# Patient Record
Sex: Female | Born: 1950 | Race: Black or African American | Hispanic: No | Marital: Married | Smoking: Never smoker
Health system: Southern US, Community
[De-identification: ages and names within clinical notes are randomized; demographics above are authoritative.]

## PROBLEM LIST (undated history)

## (undated) DIAGNOSIS — K219 Gastro-esophageal reflux disease without esophagitis: Secondary | ICD-10-CM

## (undated) DIAGNOSIS — E7800 Pure hypercholesterolemia, unspecified: Secondary | ICD-10-CM

## (undated) DIAGNOSIS — I10 Essential (primary) hypertension: Secondary | ICD-10-CM

## (undated) DIAGNOSIS — M1990 Unspecified osteoarthritis, unspecified site: Secondary | ICD-10-CM

## (undated) DIAGNOSIS — R7303 Prediabetes: Secondary | ICD-10-CM

## (undated) DIAGNOSIS — R42 Dizziness and giddiness: Secondary | ICD-10-CM

## (undated) HISTORY — DX: Gastro-esophageal reflux disease without esophagitis: K21.9

## (undated) HISTORY — PX: TUBAL LIGATION: SHX77

## (undated) HISTORY — PX: CATARACT EXTRACTION: SUR2

## (undated) HISTORY — PX: KNEE SURGERY: SHX244

## (undated) HISTORY — DX: Prediabetes: R73.03

## (undated) HISTORY — DX: Essential (primary) hypertension: I10

## (undated) HISTORY — DX: Pure hypercholesterolemia, unspecified: E78.00

---

## 1998-03-26 HISTORY — PX: BREAST BIOPSY: SHX20

## 2005-02-09 ENCOUNTER — Ambulatory Visit: Payer: Self-pay | Admitting: Occupational Therapy

## 2007-05-28 ENCOUNTER — Ambulatory Visit: Payer: Self-pay

## 2009-04-20 ENCOUNTER — Ambulatory Visit: Payer: Self-pay

## 2010-05-17 ENCOUNTER — Ambulatory Visit: Payer: Self-pay

## 2011-05-22 ENCOUNTER — Ambulatory Visit: Payer: Self-pay

## 2012-05-22 ENCOUNTER — Ambulatory Visit: Payer: Self-pay

## 2012-05-24 HISTORY — PX: COLONOSCOPY: SHX174

## 2012-08-07 ENCOUNTER — Ambulatory Visit (HOSPITAL_COMMUNITY)
Admission: RE | Admit: 2012-08-07 | Discharge: 2012-08-07 | Disposition: A | Discharge status: Home / Self Care | Payer: PRIVATE HEALTH INSURANCE | Source: Ambulatory Visit | Attending: Family Medicine | Admitting: Family Medicine

## 2012-08-07 ENCOUNTER — Other Ambulatory Visit (HOSPITAL_COMMUNITY): Payer: Self-pay | Admitting: Family Medicine

## 2012-08-07 DIAGNOSIS — R52 Pain, unspecified: Secondary | ICD-10-CM

## 2012-08-07 DIAGNOSIS — M545 Low back pain, unspecified: Secondary | ICD-10-CM | POA: Insufficient documentation

## 2012-10-02 ENCOUNTER — Ambulatory Visit: Payer: PRIVATE HEALTH INSURANCE | Admitting: Gastroenterology

## 2012-10-27 ENCOUNTER — Telehealth: Payer: Self-pay | Admitting: General Practice

## 2012-10-27 NOTE — Telephone Encounter (Signed)
I called the patient to discuss [employer] assistance, no answer, lmom

## 2012-10-27 NOTE — Telephone Encounter (Signed)
Message copied by Jennings Books on Mon Oct 27, 2012  4:08 PM ------      Message from: Diana Eves D      Created: Mon Oct 27, 2012  2:42 PM       Pt cancelled her OV due to no income and she has been trying to get [place] assistance, but can not reach anyone. She had Betty Ratliff's number and I told her there had been some changes with the assistance and that I would have CM call her back to go over what all she will need to do to apply for [facility]. [phone] ------

## 2012-10-30 ENCOUNTER — Ambulatory Visit: Payer: PRIVATE HEALTH INSURANCE | Admitting: Gastroenterology

## 2012-10-31 NOTE — Telephone Encounter (Signed)
I spoke with the patient and will leave her a financial app at the front desk with Darl Pikes

## 2012-11-20 ENCOUNTER — Encounter: Payer: Self-pay | Admitting: Gastroenterology

## 2012-11-20 ENCOUNTER — Other Ambulatory Visit: Payer: Self-pay | Admitting: Gastroenterology

## 2012-11-20 ENCOUNTER — Ambulatory Visit (INDEPENDENT_AMBULATORY_CARE_PROVIDER_SITE_OTHER): Payer: Self-pay | Admitting: Gastroenterology

## 2012-11-20 VITALS — BP 112/74 | HR 100 | Temp 98.4°F | Ht 62.0 in | Wt 214.8 lb

## 2012-11-20 DIAGNOSIS — D369 Benign neoplasm, unspecified site: Secondary | ICD-10-CM | POA: Insufficient documentation

## 2012-11-20 DIAGNOSIS — R1319 Other dysphagia: Secondary | ICD-10-CM

## 2012-11-20 DIAGNOSIS — K219 Gastro-esophageal reflux disease without esophagitis: Secondary | ICD-10-CM

## 2012-11-20 DIAGNOSIS — R1310 Dysphagia, unspecified: Secondary | ICD-10-CM

## 2012-11-20 NOTE — Progress Notes (Signed)
Primary Care Physician:  Lucius Conn Primary Gastroenterologist:  Dr. Darrick Penna   Chief Complaint  Patient presents with  . Dysphagia  . Gastrophageal Reflux    HPI:   Shannon Gallegos is a 62 year old female presenting today at the request of her PCP due to GERD and vague dysphagia.   Notes onset of symptoms beginning of the year. Trial of Omeprazole, OTC agents, with some improvement but persistent dry throat, clearing of throat, bad taste in mouth, coating of tongue. Past few months, bowel habits have changed. Feels stomach bubbling, feels like she has taken a laxative when she hasn't. Notes losing her appetite due to bitter, bile taste. Not typical indigestion but more bitter tasting in her mouth. Worse in morning. Now with progressing pill dysphagia, no solid food dysphagia. Vague dysphagia, pills getting "hung". No significant abdominal pain. Feels unsettled, "yucky". Took Pepto Bismol then noted black stool, otherwise no evidence of true melena. Reported approximately 6 or 7 pounds lost over the course of the year. Was taking Omeprazole at night.   Lower GI symptoms: all over the [facility] scale. Last issue with constipation months ago. Now stool is more soft, goes daily, sometimes a few times a day. No diarrhea. No recent antibiotics.    Last colonoscopy in March 2014 with adenomatous polyp, [facility]. Due for surveillance 2019.   Past Medical History  Diagnosis Date  . Borderline diabetes   . Hypertension   . Hypercholesterolemia   . GERD (gastroesophageal reflux disease)     Past Surgical History  Procedure Laterality Date  . Knee surgery      both  . Tubal ligation    . Colonoscopy  March 2014    Dr. Erma Heritage: sigmoid and descending colon diverticula, ADENOMATOUS polyp. Due for surveillance 2019    Current Outpatient Prescriptions  Medication Sig Dispense Refill  . aspirin 81 MG tablet Take 81 mg by mouth daily as needed for pain.      . fluticasone (FLONASE)  50 MCG/ACT nasal spray Place 2 sprays into the nose daily.      . meclizine (ANTIVERT) 25 MG tablet Take 25 mg by mouth 3 (three) times daily as needed.      . Melatonin 5 MG TABS Take 5 mg by mouth daily.      Marland Kitchen omeprazole (PRILOSEC) 40 MG capsule Take 40 mg by mouth daily.      . pravastatin (PRAVACHOL) 40 MG tablet Take 40 mg by mouth daily.      . traMADol (ULTRAM) 50 MG tablet Take 50 mg by mouth every 6 (six) hours as needed for pain.      Marland Kitchen triamcinolone (KENALOG) 0.025 % cream Apply 1 application topically 3 (three) times daily.      Marland Kitchen triamterene-hydrochlorothiazide (DYAZIDE) 50-25 MG per capsule Take 1 capsule by mouth every morning.       No current facility-administered medications for this visit.    Allergies as of 11/20/2012  . (No Known Allergies)    Family History  Problem Relation Age of Onset  . Colon cancer Neg Hx   . Cirrhosis Father     alcoholic    History   Social History  . Marital Status: Married    Spouse Name: N/A    Number of Children: N/A  . Years of Education: N/A   Occupational History  . retired    Social History Main Topics  . Smoking status: Never Smoker   . Smokeless tobacco: Not on file  .  Alcohol Use: No  . Drug Use: No  . Sexual Activity: Not on file   Other Topics Concern  . Not on file   Social History Narrative  . No narrative on file    Review of Systems: Negative unless mentioned in HPI.   Physical Exam: BP 112/74  Pulse 100  Temp(Src) 98.4 F (36.9 C) (Oral)  Ht 5\' 2"  (1.575 m)  Wt 214 lb 12.8 oz (97.433 kg)  BMI 39.28 kg/m2 General:   Alert and oriented. Pleasant and cooperative. Well-nourished and well-developed.  Head:  Normocephalic and atraumatic. Eyes:  Without icterus, sclera clear and conjunctiva pink.  Ears:  Normal auditory acuity. Nose:  No deformity, discharge,  or lesions. Mouth:  No deformity or lesions, oral mucosa pink.  Neck:  Supple, without mass or thyromegaly. Lungs:  Clear to  auscultation bilaterally. No wheezes, rales, or rhonchi. No distress.  Heart:  S1, S2 present without murmurs appreciated.  Abdomen:  +BS, soft, non-tender and non-distended. No HSM noted. No guarding or rebound. No masses appreciated.  Rectal:  Deferred  Msk:  Symmetrical without gross deformities. Normal posture. Pulses:  Normal pulses noted. Extremities:  Without clubbing or edema. Neurologic:  Alert and  oriented x4;  grossly normal neurologically. Skin:  Intact without significant lesions or rashes. Cervical Nodes:  No significant cervical adenopathy. Psych:  Alert and cooperative. Normal mood and affect.

## 2012-11-20 NOTE — Assessment & Plan Note (Signed)
[facility] 2014. Surveillance 2019.

## 2012-11-20 NOTE — Patient Instructions (Addendum)
For now, stop Omeprazole. Start taking Dexilant each morning. We have provided samples and forms to help assist with cost.  We have scheduled you for an upper endoscopy with possible dilation with Dr. Darrick Penna in the near future.  We will see you back after the procedure.   Diet for Gastroesophageal Reflux Disease, Adult Reflux (acid reflux) is when acid from your stomach flows up into the esophagus. When acid comes in contact with the esophagus, the acid causes irritation and soreness (inflammation) in the esophagus. When reflux happens often or so severely that it causes damage to the esophagus, it is called gastroesophageal reflux disease (GERD). Nutrition therapy can help ease the discomfort of GERD. FOODS OR DRINKS TO AVOID OR LIMIT  Smoking or chewing tobacco. Nicotine is one of the most potent stimulants to acid production in the gastrointestinal tract.  Caffeinated and decaffeinated coffee and black tea.  Regular or low-calorie carbonated beverages or energy drinks (caffeine-free carbonated beverages are allowed).   Strong spices, such as black pepper, white pepper, red pepper, cayenne, curry powder, and chili powder.  Peppermint or spearmint.  Chocolate.  High-fat foods, including meats and fried foods. Extra added fats including oils, butter, salad dressings, and nuts. Limit these to less than 8 tsp per day.  Fruits and vegetables if they are not tolerated, such as citrus fruits or tomatoes.  Alcohol.  Any food that seems to aggravate your condition. If you have questions regarding your diet, call your caregiver or a registered dietitian. OTHER THINGS THAT MAY HELP GERD INCLUDE:   Eating your meals slowly, in a relaxed setting.  Eating 5 to 6 small meals per day instead of 3 large meals.  Eliminating food for a period of time if it causes distress.  Not lying down until 3 hours after eating a meal.  Keeping the head of your bed raised 6 to 9 inches (15 to 23 cm) by  using a foam wedge or blocks under the legs of the bed. Lying flat may make symptoms worse.  Being physically active. Weight loss may be helpful in reducing reflux in overweight or obese adults.  Wear loose fitting clothing EXAMPLE MEAL PLAN This meal plan is approximately 2,000 calories based on https://www.bernard.org/ meal planning guidelines. Breakfast   cup cooked oatmeal.  1 cup strawberries.  1 cup low-fat milk.  1 oz almonds. Snack  1 cup cucumber slices.  6 oz yogurt (made from low-fat or fat-free milk). Lunch  2 slice whole-wheat bread.  2 oz sliced [place].  2 tsp mayonnaise.  1 cup blueberries.  1 cup snap peas. Snack  6 whole-wheat crackers.  1 oz string cheese. Dinner   cup brown rice.  1 cup mixed veggies.  1 tsp olive oil.  3 oz grilled fish. Document Released: 03/12/2005 Document Revised: 06/04/2011 Document Reviewed: 01/26/2011 [facility] Patient Information 2014 [place], [place].

## 2012-11-20 NOTE — Assessment & Plan Note (Signed)
Dilation as planned.  

## 2012-11-20 NOTE — Progress Notes (Signed)
cc'd to pcp 

## 2012-11-20 NOTE — Assessment & Plan Note (Addendum)
62 year old with persistent GERD despite Omeprazole 40 mg daily; although she was taking this at night, she does complain of vague pill dysphagia and persistent bitter taste, vague nausea. Question symptoms secondary to uncontrolled GERD, occult web, ring or stricture, may even have an underlying gastroparesis component. Warrants further investigation via EGD.   Trial of Dexilant, samples provided.  Proceed with upper endoscopy/dilation in the near future with Dr. Darrick Penna. The risks, benefits, and alternatives have been discussed in detail with patient. They have stated understanding and desire to proceed.  If persistent dry throat/clearing of throat, refer to ENT if EGD benign GERD diet provided

## 2012-11-25 ENCOUNTER — Encounter (HOSPITAL_COMMUNITY): Payer: Self-pay | Admitting: Pharmacy Technician

## 2012-12-09 ENCOUNTER — Ambulatory Visit (HOSPITAL_COMMUNITY)
Admission: RE | Admit: 2012-12-09 | Discharge: 2012-12-09 | Disposition: A | Discharge status: Home / Self Care | Payer: Self-pay | Source: Ambulatory Visit | Attending: Gastroenterology | Admitting: Gastroenterology

## 2012-12-09 ENCOUNTER — Telehealth: Payer: Self-pay | Admitting: Gastroenterology

## 2012-12-09 ENCOUNTER — Encounter (HOSPITAL_COMMUNITY): Payer: Self-pay

## 2012-12-09 ENCOUNTER — Encounter (HOSPITAL_COMMUNITY)
Admission: RE | Disposition: A | Discharge status: Home / Self Care | Payer: Self-pay | Source: Ambulatory Visit | Attending: Gastroenterology

## 2012-12-09 DIAGNOSIS — R1310 Dysphagia, unspecified: Secondary | ICD-10-CM

## 2012-12-09 DIAGNOSIS — R1319 Other dysphagia: Secondary | ICD-10-CM

## 2012-12-09 DIAGNOSIS — K2960 Other gastritis without bleeding: Secondary | ICD-10-CM

## 2012-12-09 DIAGNOSIS — K222 Esophageal obstruction: Secondary | ICD-10-CM

## 2012-12-09 DIAGNOSIS — D131 Benign neoplasm of stomach: Secondary | ICD-10-CM | POA: Insufficient documentation

## 2012-12-09 DIAGNOSIS — I10 Essential (primary) hypertension: Secondary | ICD-10-CM | POA: Insufficient documentation

## 2012-12-09 DIAGNOSIS — R7309 Other abnormal glucose: Secondary | ICD-10-CM | POA: Insufficient documentation

## 2012-12-09 DIAGNOSIS — K219 Gastro-esophageal reflux disease without esophagitis: Secondary | ICD-10-CM

## 2012-12-09 DIAGNOSIS — K2940 Chronic atrophic gastritis without bleeding: Secondary | ICD-10-CM | POA: Insufficient documentation

## 2012-12-09 DIAGNOSIS — K3189 Other diseases of stomach and duodenum: Secondary | ICD-10-CM | POA: Insufficient documentation

## 2012-12-09 DIAGNOSIS — K449 Diaphragmatic hernia without obstruction or gangrene: Secondary | ICD-10-CM | POA: Insufficient documentation

## 2012-12-09 HISTORY — PX: ESOPHAGOGASTRODUODENOSCOPY (EGD) WITH ESOPHAGEAL DILATION: SHX5812

## 2012-12-09 SURGERY — ESOPHAGOGASTRODUODENOSCOPY (EGD) WITH ESOPHAGEAL DILATION
Anesthesia: Moderate Sedation

## 2012-12-09 MED ORDER — MEPERIDINE HCL 100 MG/ML IJ SOLN
INTRAMUSCULAR | Status: DC | PRN
  Administered 2012-12-09: 25 mg via INTRAVENOUS
  Administered 2012-12-09: 50 mg via INTRAVENOUS

## 2012-12-09 MED ORDER — MIDAZOLAM HCL 5 MG/5ML IJ SOLN
INTRAMUSCULAR | Status: DC | PRN
  Administered 2012-12-09 (×2): 2 mg via INTRAVENOUS
  Administered 2012-12-09: 1 mg via INTRAVENOUS

## 2012-12-09 MED ORDER — STERILE WATER FOR IRRIGATION IR SOLN
Status: DC | PRN
  Administered 2012-12-09: 14:00:00

## 2012-12-09 MED ORDER — BUTAMBEN-TETRACAINE-BENZOCAINE 2-2-14 % EX AERO
INHALATION_SPRAY | CUTANEOUS | Status: DC | PRN
  Administered 2012-12-09: 2 via TOPICAL

## 2012-12-09 MED ORDER — MEPERIDINE HCL 100 MG/ML IJ SOLN
INTRAMUSCULAR | Status: AC
  Filled 2012-12-09: qty 2

## 2012-12-09 MED ORDER — SODIUM CHLORIDE 0.9 % IV SOLN
INTRAVENOUS | Status: DC
  Administered 2012-12-09: 13:00:00 via INTRAVENOUS

## 2012-12-09 MED ORDER — MIDAZOLAM HCL 5 MG/5ML IJ SOLN
INTRAMUSCULAR | Status: AC
  Filled 2012-12-09: qty 10

## 2012-12-09 MED ORDER — MINERAL OIL PO OIL
TOPICAL_OIL | ORAL | Status: AC
  Filled 2012-12-09: qty 30

## 2012-12-09 NOTE — Telephone Encounter (Signed)
Patient is scheduled w/Dr. Suszanne Conners in the [facility] on Thursday Oct 9th at 1:30 and I will call and let Ms. Boyington know

## 2012-12-09 NOTE — H&P (Signed)
Primary Care Physician:  Lucius Conn Primary Gastroenterologist:  Dr. Darrick Penna  Pre-Procedure History & Physical: HPI:  Shannon Gallegos is a 62 y.o. female here for DYSPHAGIA/NEW ONSET DYSPEPSIA.  Past Medical History  Diagnosis Date  . Borderline diabetes   . Hypertension   . Hypercholesterolemia   . GERD (gastroesophageal reflux disease)     Past Surgical History  Procedure Laterality Date  . Knee surgery      both  . Tubal ligation    . Colonoscopy  March 2014    Dr. Erma Heritage: sigmoid and descending colon diverticula, ADENOMATOUS polyp. Due for surveillance 2019    Prior to Admission medications   Medication Sig Start Date End Date Taking? Authorizing Provider  aspirin 81 MG tablet Take 81 mg by mouth daily.    Yes Historical Provider, MD  dexlansoprazole (DEXILANT) 60 MG capsule Take 60 mg by mouth daily.   Yes Historical Provider, MD  diphenhydramine-acetaminophen (TYLENOL PM) 25-500 MG TABS Take 1 tablet by mouth at bedtime as needed.   Yes Historical Provider, MD  fluticasone (FLONASE) 50 MCG/ACT nasal spray Place 2 sprays into the nose daily.   Yes Historical Provider, MD  meclizine (ANTIVERT) 25 MG tablet Take 25 mg by mouth 3 (three) times daily as needed for dizziness.    Yes Historical Provider, MD  Melatonin 5 MG TABS Take 5 mg by mouth at bedtime.    Yes Historical Provider, MD  pravastatin (PRAVACHOL) 40 MG tablet Take 40 mg by mouth daily.   Yes Historical Provider, MD  traMADol (ULTRAM) 50 MG tablet Take 50 mg by mouth every 6 (six) hours as needed for pain.   Yes Historical Provider, MD  triamcinolone (KENALOG) 0.025 % cream Apply 1 application topically 3 (three) times daily.   Yes Historical Provider, MD  triamterene-hydrochlorothiazide (DYAZIDE) 50-25 MG per capsule Take 1 capsule by mouth every morning.   Yes Historical Provider, MD  omeprazole (PRILOSEC) 40 MG capsule Take 40 mg by mouth daily.    Historical Provider, MD    Allergies as of  11/20/2012  . (No Known Allergies)    Family History  Problem Relation Age of Onset  . Colon cancer Neg Hx   . Cirrhosis Father     alcoholic    History   Social History  . Marital Status: Married    Spouse Name: N/A    Number of Children: N/A  . Years of Education: N/A   Occupational History  . retired    Social History Main Topics  . Smoking status: Never Smoker   . Smokeless tobacco: Not on file  . Alcohol Use: No  . Drug Use: No  . Sexual Activity: Not on file   Other Topics Concern  . Not on file   Social History Narrative  . No narrative on file    Review of Systems: See HPI, otherwise negative ROS   Physical Exam: BP 125/83  Pulse 91  Temp(Src) 97.5 F (36.4 C) (Oral)  Ht 5\' 2"  (1.575 m)  Wt 214 lb (97.07 kg)  BMI 39.13 kg/m2  SpO2 100% General:   Alert,  pleasant and cooperative in NAD Head:  Normocephalic and atraumatic. Neck:  Supple; Lungs:  Clear throughout to auscultation.    Heart:  Regular rate and rhythm. Abdomen:  Soft, nontender and nondistended. Normal bowel sounds, without guarding, and without rebound.   Neurologic:  Alert and  oriented x4;  grossly normal neurologically.  Impression/Plan:  DYSPHAGIA/NEW ONSET DYSPEPSIA  PLAN:  EGD/?DIL TODAY

## 2012-12-09 NOTE — Telephone Encounter (Signed)
Message copied by Glendora Score on Tue Dec 09, 2012  4:17 PM ------      Message from: [place]      Created: Tue Dec 09, 2012  4:10 PM       Refer to ent DR. Suszanne Conners for dry throat ------

## 2012-12-09 NOTE — Op Note (Signed)
[facility] [address] [place], [address]   ENDOSCOPY PROCEDURE REPORT  PATIENT: Shannon, Gallegos  MR#: [id number] BIRTHDATE: [birth year] , 62  yrs. old GENDER: Female  ENDOSCOPIST: Jonette Eva, MD REFFERED [id number] Merilynn Finland, PA-C  PROCEDURE DATE:  12/09/2012 PROCEDURE:   EGD with biopsy and with dilatation over guidewire  INDICATIONS:1.  dysphagia.   2.  dyspepsia.   3.  DRY THROAT. MEDICATIONS: Demerol 75 mg IV and Versed 5 mg IV TOPICAL ANESTHETIC: Cetacaine Spray  DESCRIPTION OF PROCEDURE:   After the risks benefits and alternatives of the procedure were thoroughly explained, informed consent was obtained.  The EG-[id number] ([id number])  endoscope was introduced through the mouth and advanced to the second portion of the duodenum. The instrument was slowly withdrawn as the mucosa was carefully examined.  Prior to withdrawal of the scope, the guidwire was placed.  The esophagus was dilated successfully.  The patient was recovered in endoscopy and discharged home in satisfactory condition.   ESOPHAGUS: A small hiatal hernia was noted.   A Schatzki ring was found at the gastroesophageal junction.   STOMACH: An innumerable number of small sessile polyps were found in the gastric body. Multiple biopsies was performed using cold forceps.   Moderate erosive gastritis (inflammation) was found in the gastric antrum. Multiple biopsies were performed using cold forceps.   DUODENUM: The duodenal mucosa showed no abnormalities in the bulb and second portion of the duodenum.   Dilation was then performed at the gastroesphageal junction  Dilator: Savary over guidewire Size(s): 15-16 MM Resistance: minimal Heme: yes  COMPLICATIONS: There were no complications.  ENDOSCOPIC IMPRESSION: 1.   Small hiatal hernia 2.   Schatzki ring at the gastroesophageal junction 3.   Innumerable number of small sessile polyps 4.   MODERATE Erosive  gastritis  RECOMMENDATIONS: CONTINUE YOUR WEIGHT LOSS EFFORTS. CONTINUE OMEPRAZOLE.  TAKE 30 MINUTES PRIOR TO YOUR FIRST MEAL. FOLLOW A LOW FAT DIET. BIOPSY WILL BE BACK IN 7 DAYS. ENT REFERRAL FOR DRY THROAT FOLLOW UP IN 3 MOS.      _______________________________ Rosalie DoctorJonette Eva, MD 12/09/2012 4:03 PM      PATIENT NAME:  Shannon, Gallegos MR#: [id number]

## 2012-12-11 NOTE — Telephone Encounter (Signed)
REVIEWED.  

## 2012-12-12 ENCOUNTER — Encounter (HOSPITAL_COMMUNITY): Payer: Self-pay | Admitting: Gastroenterology

## 2012-12-13 ENCOUNTER — Telehealth: Payer: Self-pay | Admitting: Gastroenterology

## 2012-12-13 NOTE — Telephone Encounter (Signed)
Please call pt. HER stomach Bx shows gastritis. SHE HAS BENIGN STOMACH POLYPS.   CONTINUE YOUR WEIGHT LOSS EFFORTS. CONTINUE OMEPRAZOLE.  TAKE 30 MINUTES PRIOR TO YOUR FIRST MEAL.  FOLLOW A LOW FAT DIET.   SEE ENT FOR HER DRY THROAT.  FOLLOW UP IN 3 MOS E30 W/ AS DYSPHAGIA/ABD PAIN.

## 2012-12-15 NOTE — Telephone Encounter (Signed)
Reminder in epic °

## 2012-12-15 NOTE — Telephone Encounter (Signed)
Pt aware of results 

## 2013-01-01 ENCOUNTER — Ambulatory Visit (INDEPENDENT_AMBULATORY_CARE_PROVIDER_SITE_OTHER): Payer: Self-pay | Admitting: Otolaryngology

## 2013-01-01 ENCOUNTER — Encounter (INDEPENDENT_AMBULATORY_CARE_PROVIDER_SITE_OTHER): Payer: Self-pay

## 2013-01-01 DIAGNOSIS — R070 Pain in throat: Secondary | ICD-10-CM

## 2013-03-05 ENCOUNTER — Encounter: Payer: Self-pay | Admitting: Gastroenterology

## 2013-03-24 ENCOUNTER — Telehealth: Payer: Self-pay | Admitting: Gastroenterology

## 2013-03-24 NOTE — Telephone Encounter (Signed)
Patient called to see if we could fax Shannon Gallegos about getting her Dexilant prescription. She said she was having to renew the paperwork and was told by Shannon Gallegos if she doesn't have proof of income for [place] to just write something out for her stating that she hasn't worked and has received no income and that would be the fastest way for her to start receiving her Dexilant. Any questions call Shannon Gallegos at [phone]

## 2013-03-25 ENCOUNTER — Encounter: Payer: Self-pay | Admitting: Gastroenterology

## 2013-03-25 NOTE — Telephone Encounter (Signed)
AS did letter, it has been faxed to the pt assistance company. Tried to call pt- LM with this information.

## 2013-04-22 ENCOUNTER — Ambulatory Visit: Payer: Self-pay | Admitting: Gastroenterology

## 2013-04-23 ENCOUNTER — Encounter: Payer: Self-pay | Admitting: Gastroenterology

## 2013-04-23 ENCOUNTER — Ambulatory Visit (INDEPENDENT_AMBULATORY_CARE_PROVIDER_SITE_OTHER): Payer: Self-pay | Admitting: Gastroenterology

## 2013-04-23 VITALS — BP 124/83 | HR 93 | Temp 97.6°F | Wt 207.6 lb

## 2013-04-23 DIAGNOSIS — K117 Disturbances of salivary secretion: Secondary | ICD-10-CM | POA: Insufficient documentation

## 2013-04-23 DIAGNOSIS — R1013 Epigastric pain: Secondary | ICD-10-CM | POA: Insufficient documentation

## 2013-04-23 DIAGNOSIS — R1310 Dysphagia, unspecified: Secondary | ICD-10-CM

## 2013-04-23 DIAGNOSIS — D369 Benign neoplasm, unspecified site: Secondary | ICD-10-CM

## 2013-04-23 DIAGNOSIS — K319 Disease of stomach and duodenum, unspecified: Secondary | ICD-10-CM | POA: Insufficient documentation

## 2013-04-23 DIAGNOSIS — K219 Gastro-esophageal reflux disease without esophagitis: Secondary | ICD-10-CM

## 2013-04-23 DIAGNOSIS — R682 Dry mouth, unspecified: Secondary | ICD-10-CM | POA: Insufficient documentation

## 2013-04-23 DIAGNOSIS — K3189 Other diseases of stomach and duodenum: Secondary | ICD-10-CM

## 2013-04-23 NOTE — Assessment & Plan Note (Signed)
Improved. Still with pill dysphagia. Consider BPE if worsens. No solid food dysphagia. 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

## 2013-04-23 NOTE — Patient Instructions (Addendum)
Continue to take Dexilant each morning.   We will try to get a second opinion from an ENT specialist. I also am having our office check on possible dental professionals in the [facility] network.   We have scheduled you for an ultrasound of your belly.   Further recommendations to follow!  Gastritis, Adult Gastritis is soreness and swelling (inflammation) of the lining of the stomach. Gastritis can develop as a sudden onset (acute) or long-term (chronic) condition. If gastritis is not treated, it can lead to stomach bleeding and ulcers. CAUSES  Gastritis occurs when the stomach lining is weak or damaged. Digestive juices from the stomach then inflame the weakened stomach lining. The stomach lining may be weak or damaged due to viral or bacterial infections. One common bacterial infection is the Helicobacter pylori infection. Gastritis can also result from excessive alcohol consumption, taking certain medicines, or having too much acid in the stomach.  SYMPTOMS  In some cases, there are no symptoms. When symptoms are present, they may include:  Pain or a burning sensation in the upper abdomen.  Nausea.  Vomiting.  An uncomfortable feeling of fullness after eating. DIAGNOSIS  Your caregiver may suspect you have gastritis based on your symptoms and a physical exam. To determine the cause of your gastritis, your caregiver may perform the following:  Blood or stool tests to check for the H pylori bacterium.  Gastroscopy. A thin, flexible tube (endoscope) is passed down the esophagus and into the stomach. The endoscope has a light and camera on the end. Your caregiver uses the endoscope to view the inside of the stomach.  Taking a tissue sample (biopsy) from the stomach to examine under a microscope. TREATMENT  Depending on the cause of your gastritis, medicines may be prescribed. If you have a bacterial infection, such as an H pylori infection, antibiotics may be given. If your gastritis  is caused by too much acid in the stomach, H2 blockers or antacids may be given. Your caregiver may recommend that you stop taking aspirin, ibuprofen, or other nonsteroidal anti-inflammatory drugs (NSAIDs). HOME CARE INSTRUCTIONS  Only take over-the-counter or prescription medicines as directed by your caregiver.  If you were given antibiotic medicines, take them as directed. Finish them even if you start to feel better.  Drink enough fluids to keep your urine clear or pale yellow.  Avoid foods and drinks that make your symptoms worse, such as:  Caffeine or alcoholic drinks.  Chocolate.  Peppermint or mint flavorings.  Garlic and onions.  Spicy foods.  Citrus fruits, such as oranges, lemons, or limes.  Tomato-based foods such as sauce, chili, salsa, and pizza.  Fried and fatty foods.  Eat small, frequent meals instead of large meals. SEEK IMMEDIATE MEDICAL CARE IF:   You have black or dark red stools.  You vomit blood or material that looks like coffee grounds.  You are unable to keep fluids down.  Your abdominal pain gets worse.  You have a fever.  You do not feel better after 1 week.  You have any other questions or concerns. MAKE SURE YOU:  Understand these instructions.  Will watch your condition.  Will get help right away if you are not doing well or get worse. Document Released: 03/06/2001 Document Revised: 09/11/2011 Document Reviewed: 04/25/2011 [facility] Patient Information 2014 [place].

## 2013-04-23 NOTE — Assessment & Plan Note (Addendum)
Vague epigastric discomfort, "queasiness" intermittently. Likely secondary to gastritis, dietary behaviors. Continue Dexilant daily. [place] of abdomen; gallbladder remains in situ. Highly doubt biliary component.

## 2013-04-23 NOTE — Assessment & Plan Note (Signed)
Controlled with Dexilant. Continue daily.

## 2013-04-23 NOTE — Progress Notes (Signed)
Referring Provider: Avelino Leeds* Primary Care Physician:  Mackey Birchwood Primary GI: Dr. Oneida Alar   Chief Complaint  Patient presents with  . Follow-up  . Nausea    HPI:   Shannon Gallegos presents today in follow-up after EGD/ED secondary to dysphagia. Noted small hiatal hernia, Schatzki's ring at GE junction s/p Savary dilation, benign sessile polyps, moderate gastritis. Colonoscopy up-to-date; due for surveillance in 2019.  Complains of persistent dry throat. Saw ENT, Dr. Melene Plan. Drinks a few swallows of water then 2 minutes feels dry again. Has water by bedside at night to swallow. States was told to get "entertainer spray" and lemon drops to promote saliva. Has a very bitter taste in mouth.  Feels queasy a lot in upper abdomen. Will have spells during the day, feels like has to throw up. Not associated with eating. Sometimes epigastric discomfort with eating but not severe. Sometimes pill dysphagia, gets a little "hung" but better with water. No solid food dysphagia. Has significant bitter taste in mouth. Dexilant has helped. Sometimes when eating pickles, has pain in back of throat. Has to sneeze.   Worried about paternal aunt who had stage IV liver cancer.   Past Medical History  Diagnosis Date  . Borderline diabetes   . Hypertension   . Hypercholesterolemia   . GERD (gastroesophageal reflux disease)     Past Surgical History  Procedure Laterality Date  . Knee surgery      both  . Tubal ligation    . Colonoscopy  March 2014    Dr. Ellender Hose: sigmoid and descending colon diverticula, ADENOMATOUS polyp. Due for surveillance 2019  . Esophagogastroduodenoscopy (egd) with esophageal dilation N/A 12/09/2012    Dr. Oneida Alar: small hiatal hernia, Schatzki's ring at GE junction s/p Savary dilation, benign sessile polyps, moderate gastritis    Current Outpatient Prescriptions  Medication Sig Dispense Refill  . aspirin 81 MG tablet Take 81 mg by mouth daily.        Marland Kitchen dexlansoprazole (DEXILANT) 60 MG capsule Take 60 mg by mouth daily.      . diphenhydramine-acetaminophen (TYLENOL PM) 25-500 MG TABS Take 1 tablet by mouth at bedtime as needed.      . fluticasone (FLONASE) 50 MCG/ACT nasal spray Place 2 sprays into the nose daily.      . meclizine (ANTIVERT) 25 MG tablet Take 25 mg by mouth 3 (three) times daily as needed for dizziness.       . Melatonin 5 MG TABS Take 5 mg by mouth at bedtime.       . pravastatin (PRAVACHOL) 40 MG tablet Take 40 mg by mouth daily.      . traMADol (ULTRAM) 50 MG tablet Take 50 mg by mouth every 6 (six) hours as needed for pain.      Marland Kitchen triamcinolone (KENALOG) 0.025 % cream Apply 1 application topically 3 (three) times daily.      Marland Kitchen triamterene-hydrochlorothiazide (DYAZIDE) 50-25 MG per capsule Take 1 capsule by mouth every morning.       No current facility-administered medications for this visit.    Allergies as of 04/23/2013  . (No Known Allergies)    Family History  Problem Relation Age of Onset  . Colon cancer Neg Hx   . Cirrhosis Father     alcoholic    History   Social History  . Marital Status: Married    Spouse Name: N/A    Number of Children: N/A  . Years  of Education: N/A   Occupational History  . retired    Social History Main Topics  . Smoking status: Never Smoker   . Smokeless tobacco: None  . Alcohol Use: No  . Drug Use: No  . Sexual Activity: None   Other Topics Concern  . None   Social History Narrative  . None    Review of Systems: As mentioned in HPI  Physical Exam: BP 124/83  Pulse 93  Temp(Src) 97.6 F (36.4 C) (Oral)  Wt 207 lb 9.6 oz (94.167 kg) General:   Alert and oriented. No distress noted. Pleasant and cooperative. Talkative.  Head:  Normocephalic and atraumatic. Eyes:  Conjuctiva clear without scleral icterus. Mouth:  Oral mucosa pink and moist. Partial dentures Neck:  Supple, without mass or thyromegaly. Heart:  S1, S2 present without murmurs, rubs, or  gallops. Regular rate and rhythm. Abdomen:  +BS, soft, mild discomfort, "awareness" when palpating epigastric region and non-distended. No rebound or guarding. No HSM or masses noted. Msk:  Symmetrical without gross deformities. Normal posture. Psych:  Alert and cooperative. Normal mood and affect.

## 2013-04-23 NOTE — Assessment & Plan Note (Signed)
Surveillance in 2019.

## 2013-04-23 NOTE — Assessment & Plan Note (Signed)
Persistent. Has seen ENT without any abnormal findings (Dr. Melene Plan). Patient requesting second opinion. Have also asked her to have a dental examination as well. Unclear etiology; from a GI standpoint, no further work-up.

## 2013-04-23 NOTE — Progress Notes (Signed)
cc'd to pcp 

## 2013-04-24 ENCOUNTER — Ambulatory Visit (HOSPITAL_COMMUNITY)
Admission: RE | Admit: 2013-04-24 | Discharge: 2013-04-24 | Disposition: A | Discharge status: Home / Self Care | Payer: Self-pay | Source: Ambulatory Visit | Attending: Gastroenterology | Admitting: Gastroenterology

## 2013-04-24 DIAGNOSIS — R110 Nausea: Secondary | ICD-10-CM | POA: Insufficient documentation

## 2013-04-24 DIAGNOSIS — K3189 Other diseases of stomach and duodenum: Secondary | ICD-10-CM | POA: Insufficient documentation

## 2013-04-24 DIAGNOSIS — R1013 Epigastric pain: Secondary | ICD-10-CM

## 2013-04-24 DIAGNOSIS — K7689 Other specified diseases of liver: Secondary | ICD-10-CM | POA: Insufficient documentation

## 2013-04-28 ENCOUNTER — Other Ambulatory Visit: Payer: Self-pay | Admitting: Gastroenterology

## 2013-04-28 DIAGNOSIS — K7689 Other specified diseases of liver: Secondary | ICD-10-CM

## 2013-04-28 NOTE — Progress Notes (Signed)
Quick Note:  Called and informed pt. OK to schedule CT. ______

## 2013-04-28 NOTE — Progress Notes (Signed)
Quick Note:  [place] of abdomen without gallstones or wall thickening.  She has a complex cyst-like structure in her liver.  I feel this warrants further investigation: needs CT abd to evaluate liver cyst further. ______

## 2013-05-01 ENCOUNTER — Ambulatory Visit (HOSPITAL_COMMUNITY)
Admission: RE | Admit: 2013-05-01 | Discharge: 2013-05-01 | Disposition: A | Discharge status: Home / Self Care | Payer: Self-pay | Source: Ambulatory Visit | Attending: Gastroenterology | Admitting: Gastroenterology

## 2013-05-01 DIAGNOSIS — R109 Unspecified abdominal pain: Secondary | ICD-10-CM | POA: Insufficient documentation

## 2013-05-01 DIAGNOSIS — N200 Calculus of kidney: Secondary | ICD-10-CM | POA: Insufficient documentation

## 2013-05-01 DIAGNOSIS — K7689 Other specified diseases of liver: Secondary | ICD-10-CM | POA: Insufficient documentation

## 2013-05-01 DIAGNOSIS — D3500 Benign neoplasm of unspecified adrenal gland: Secondary | ICD-10-CM | POA: Insufficient documentation

## 2013-05-01 DIAGNOSIS — K449 Diaphragmatic hernia without obstruction or gangrene: Secondary | ICD-10-CM | POA: Insufficient documentation

## 2013-05-01 LAB — POCT I-STAT CREATININE: CREATININE: 1.3 mg/dL — AB (ref 0.50–1.10)

## 2013-05-01 MED ORDER — IOHEXOL 300 MG/ML  SOLN
100.0000 mL | Freq: Once | INTRAMUSCULAR | Status: AC | PRN
  Administered 2013-05-01: 100 mL via INTRAVENOUS

## 2013-05-03 DIAGNOSIS — M25569 Pain in unspecified knee: Secondary | ICD-10-CM | POA: Insufficient documentation

## 2013-05-05 ENCOUNTER — Telehealth: Payer: Self-pay | Admitting: *Deleted

## 2013-05-05 NOTE — Progress Notes (Signed)
Quick Note:  CT reveals a liver cyst. We will just monitor this. [place] of abdomen in 6 months just to monitor.  Small hiatal hernia. Looks like lower part of esophagus may have some "thickening". EGD up-to-date Aug 2014. Known gastritis. How are her symptoms? If any persistent dysphagia, needs BPE. ______

## 2013-05-05 NOTE — Telephone Encounter (Signed)
I believe it was done 2/6. I have sent result note back to you.

## 2013-05-05 NOTE — Telephone Encounter (Signed)
Pt called wanting to get the results of her CT she had done last Friday. Please Advise [phone]

## 2013-05-05 NOTE — Telephone Encounter (Signed)
Routing to Anna Sams, NP. 

## 2013-05-06 ENCOUNTER — Other Ambulatory Visit: Payer: Self-pay | Admitting: Gastroenterology

## 2013-05-06 DIAGNOSIS — R1319 Other dysphagia: Secondary | ICD-10-CM

## 2013-05-06 NOTE — Progress Notes (Signed)
Quick Note:  Routing back to Laban Emperor, NP also. ______

## 2013-05-06 NOTE — Progress Notes (Signed)
Quick Note:  Called and informed pt. She said she has a very bitter taste in her mouth a lot and wants to know what that comes from. She still does have some problems with swallowing and has to drink water to get food down at times. Ok to schedule the BPE. ______

## 2013-05-11 ENCOUNTER — Ambulatory Visit (HOSPITAL_COMMUNITY)
Admission: RE | Admit: 2013-05-11 | Discharge: 2013-05-11 | Disposition: A | Discharge status: Home / Self Care | Payer: Self-pay | Source: Ambulatory Visit | Attending: Gastroenterology | Admitting: Gastroenterology

## 2013-05-11 DIAGNOSIS — R1319 Other dysphagia: Secondary | ICD-10-CM

## 2013-05-11 DIAGNOSIS — K449 Diaphragmatic hernia without obstruction or gangrene: Secondary | ICD-10-CM | POA: Insufficient documentation

## 2013-05-11 DIAGNOSIS — R1310 Dysphagia, unspecified: Secondary | ICD-10-CM | POA: Insufficient documentation

## 2013-05-11 DIAGNOSIS — K225 Diverticulum of esophagus, acquired: Secondary | ICD-10-CM | POA: Insufficient documentation

## 2013-05-12 NOTE — Progress Notes (Signed)
Quick Note:  BPE reviewed. As expected, she has mild age-related changes of esophageal dysmotility.  CT raised question of some type of irregularity of esophagus/stomach. Not seen on BPE. EGD up-to-date.  Let's have patient return for a non-urgent appt. Gallbladder remains in situ. ?biliary dyskinesia as cuplrit of dyspepsia?  Needs to chew well, take small bites, break large pills in smaller pieces (IF they are appropriate to be divided). Sit upright while eating. ______

## 2013-05-13 NOTE — Progress Notes (Signed)
Pt has appointment 06/01/13 at 2:00 with [facility]

## 2013-05-13 NOTE — Progress Notes (Signed)
Quick Note:  Pt aware of results/ routing to Chelsey to schedule appt. ______

## 2013-05-27 ENCOUNTER — Ambulatory Visit: Payer: Self-pay

## 2013-06-01 ENCOUNTER — Ambulatory Visit (HOSPITAL_COMMUNITY)
Admission: RE | Admit: 2013-06-01 | Discharge: 2013-06-01 | Disposition: A | Discharge status: Home / Self Care | Payer: Self-pay | Source: Ambulatory Visit | Attending: Gastroenterology | Admitting: Gastroenterology

## 2013-06-01 ENCOUNTER — Ambulatory Visit (INDEPENDENT_AMBULATORY_CARE_PROVIDER_SITE_OTHER): Payer: Self-pay | Admitting: Gastroenterology

## 2013-06-01 ENCOUNTER — Encounter: Payer: Self-pay | Admitting: Gastroenterology

## 2013-06-01 VITALS — BP 137/84 | HR 77 | Temp 98.1°F | Wt 207.4 lb

## 2013-06-01 DIAGNOSIS — K219 Gastro-esophageal reflux disease without esophagitis: Secondary | ICD-10-CM

## 2013-06-01 DIAGNOSIS — M545 Low back pain, unspecified: Secondary | ICD-10-CM | POA: Insufficient documentation

## 2013-06-01 DIAGNOSIS — R109 Unspecified abdominal pain: Secondary | ICD-10-CM

## 2013-06-01 DIAGNOSIS — R682 Dry mouth, unspecified: Secondary | ICD-10-CM

## 2013-06-01 DIAGNOSIS — K117 Disturbances of salivary secretion: Secondary | ICD-10-CM

## 2013-06-01 NOTE — Progress Notes (Signed)
Referring Provider: Avelino Leeds* Primary Care Physician:  Mackey Birchwood Primary GI: Dr. Oneida Alar   Chief Complaint  Patient presents with  . Follow-up    HPI:   Returns today in follow-up with history of dry throat, dysphagia, GERD. EGD and colonoscopy up-to-date. Last seen Jan 2015 doing well with Dexilant. Epigastric discomfort noted at that time. [place] of abdomen ordered revealing complex cystic structure in left hepatic lobe, otherwise normal. CT ordered to further characterize, revealing possible thickening/mass involving distal esophagus/proximal stomach. Tiny hepatic cyst, right adrenal adenoma, right renal stone without obstruction. BPE then ordered to further evaluate esophagus, as EGD was up to date. This showed small hiatal hernia, tiny zenker diverticulum, smooth appearance of esophagus.   Notes right flank pain/right lower back, constant. Worsened with movements. Denies abdominal pain. Dry throat, mouth, persistent. Wakes up in middle of night with dry mouth. Avoiding spicy, greasy foods. Has seen ENT (Dr. Melene Plan). Wants a second opinion.   Past Medical History  Diagnosis Date  . Borderline diabetes   . Hypertension   . Hypercholesterolemia   . GERD (gastroesophageal reflux disease)     Past Surgical History  Procedure Laterality Date  . Knee surgery      both  . Tubal ligation    . Colonoscopy  March 2014    Dr. Ellender Hose: sigmoid and descending colon diverticula, ADENOMATOUS polyp. Due for surveillance 2019  . Esophagogastroduodenoscopy (egd) with esophageal dilation N/A 12/09/2012    Dr. Oneida Alar: small hiatal hernia, Schatzki's ring at GE junction s/p Savary dilation, benign sessile polyps, moderate gastritis    Current Outpatient Prescriptions  Medication Sig Dispense Refill  . aspirin 81 MG tablet Take 81 mg by mouth daily.       Marland Kitchen dexlansoprazole (DEXILANT) 60 MG capsule Take 60 mg by mouth daily.      . diphenhydramine-acetaminophen (TYLENOL  PM) 25-500 MG TABS Take 1 tablet by mouth at bedtime as needed.      . fluticasone (FLONASE) 50 MCG/ACT nasal spray Place 2 sprays into the nose daily.      . meclizine (ANTIVERT) 25 MG tablet Take 25 mg by mouth 3 (three) times daily as needed for dizziness.       . Melatonin 5 MG TABS Take 5 mg by mouth at bedtime.       . pravastatin (PRAVACHOL) 40 MG tablet Take 40 mg by mouth daily.      . traMADol (ULTRAM) 50 MG tablet Take 50 mg by mouth every 6 (six) hours as needed for pain.      Marland Kitchen triamcinolone (KENALOG) 0.025 % cream Apply 1 application topically 3 (three) times daily.      Marland Kitchen triamterene-hydrochlorothiazide (DYAZIDE) 50-25 MG per capsule Take 1 capsule by mouth every morning.       No current facility-administered medications for this visit.    Allergies as of 06/01/2013  . (No Known Allergies)    Family History  Problem Relation Age of Onset  . Colon cancer Neg Hx   . Cirrhosis Father     alcoholic    History   Social History  . Marital Status: Married    Spouse Name: N/A    Number of Children: N/A  . Years of Education: N/A   Occupational History  . retired    Social History Main Topics  . Smoking status: Never Smoker   . Smokeless tobacco: None  . Alcohol Use: No  . Drug Use: No  .  Sexual Activity: None   Other Topics Concern  . None   Social History Narrative  . None    Review of Systems: As mentioned in HPI.   Physical Exam: BP 137/84  Pulse 77  Temp(Src) 98.1 F (36.7 C) (Oral)  Wt 207 lb 7.2 oz (94.099 kg) General:   Alert and oriented. No distress noted. Pleasant and cooperative.  Head:  Normocephalic and atraumatic. Eyes:  Conjuctiva clear without scleral icterus. Mouth:  Oral mucosa pink and moist. Good dentition. No lesions. Heart:  S1, S2 present without murmurs, rubs, or gallops.  Abdomen:  +BS, soft, non-tender and non-distended. No rebound or guarding. No HSM or masses noted. Msk:  Symmetrical without gross deformities. Normal  posture. Extremities:  Without edema. Neurologic:  Alert and  oriented x4;  grossly normal neurologically. Skin:  Intact without significant lesions or rashes. Psych:  Alert and cooperative. Normal mood and affect.

## 2013-06-01 NOTE — Patient Instructions (Signed)
Stop Dexilant for 1 week. Instead, take Ranitidine in the evening for 7 days. If you have no change in the symptoms of dry mouth, resume taking Dexilant daily.  We have referred you again to an ENT specialist.  Please complete the xray today. We will call with the results!

## 2013-06-03 NOTE — Progress Notes (Signed)
Mrs. Limb has opted to hold off on referral to ENT, Dr. Benjamine Mola is the only ENT that accepts the [facility] Assistance and she cant afford to pay out of pocket at this time. She will see how things go and if she changes her mind she will call back to be re referred

## 2013-06-04 ENCOUNTER — Telehealth: Payer: Self-pay | Admitting: *Deleted

## 2013-06-04 DIAGNOSIS — R109 Unspecified abdominal pain: Secondary | ICD-10-CM | POA: Insufficient documentation

## 2013-06-04 NOTE — Telephone Encounter (Signed)
Please see under result notes.

## 2013-06-04 NOTE — Assessment & Plan Note (Signed)
Persistent. Thorough GI work-up on file. Refer to ENT for second opinion.

## 2013-06-04 NOTE — Assessment & Plan Note (Signed)
Right flank/right back discomfort. Constant, aggravated by movements. Patient concerned that renal stone is culprit; highly doubt this. Non-obstructive. She has no urinary symptoms or other concerning signs. Question musculoskeletal. She has been thoroughly evaluated recently with [place] of abdomen, CT, and BPE.   Proceed with lumbar xray Refer back to PCP [facility] nephrology appt due to renal stones; patient declined.

## 2013-06-04 NOTE — Telephone Encounter (Signed)
Pt aware of results 

## 2013-06-04 NOTE — Telephone Encounter (Signed)
Pt is calling about her results from her x-ray, pt said doris called her yesterday. Please advise [phone]

## 2013-06-04 NOTE — Telephone Encounter (Signed)
I do not see anything to tell pat. Please advise

## 2013-06-04 NOTE — Progress Notes (Signed)
Quick Note:  Chronic changes on xray. Mild scoliosis Would recommend follow-up with PCP. ______

## 2013-06-04 NOTE — Assessment & Plan Note (Signed)
Continue Dexilant daily.  

## 2013-06-08 NOTE — Progress Notes (Signed)
cc'd to pcp 

## 2013-06-16 NOTE — Progress Notes (Signed)
REVIEWED.  EGD/DIL SEP 2014 GASTRITIS, FG POLYPS

## 2013-06-16 NOTE — Progress Notes (Signed)
REVIEWED.  

## 2013-06-25 DIAGNOSIS — Z9849 Cataract extraction status, unspecified eye: Secondary | ICD-10-CM | POA: Insufficient documentation

## 2013-07-07 DIAGNOSIS — Z961 Presence of intraocular lens: Secondary | ICD-10-CM | POA: Insufficient documentation

## 2013-07-15 DIAGNOSIS — Z0289 Encounter for other administrative examinations: Secondary | ICD-10-CM

## 2013-08-04 DIAGNOSIS — H04129 Dry eye syndrome of unspecified lacrimal gland: Secondary | ICD-10-CM | POA: Insufficient documentation

## 2014-06-15 DIAGNOSIS — M722 Plantar fascial fibromatosis: Secondary | ICD-10-CM | POA: Insufficient documentation

## 2014-06-30 ENCOUNTER — Ambulatory Visit
Admit: 2014-06-30 | Disposition: A | Discharge status: Home / Self Care | Payer: Self-pay | Attending: Urgent Care | Admitting: Urgent Care

## 2014-11-06 ENCOUNTER — Encounter (HOSPITAL_COMMUNITY): Payer: Self-pay | Admitting: *Deleted

## 2014-11-06 ENCOUNTER — Emergency Department (HOSPITAL_COMMUNITY)
Admission: EM | Admit: 2014-11-06 | Discharge: 2014-11-06 | Disposition: A | Discharge status: Home / Self Care | Payer: Self-pay | Attending: Emergency Medicine | Admitting: Emergency Medicine

## 2014-11-06 ENCOUNTER — Emergency Department (HOSPITAL_COMMUNITY): Payer: Self-pay

## 2014-11-06 DIAGNOSIS — Z791 Long term (current) use of non-steroidal anti-inflammatories (NSAID): Secondary | ICD-10-CM | POA: Insufficient documentation

## 2014-11-06 DIAGNOSIS — Z79899 Other long term (current) drug therapy: Secondary | ICD-10-CM | POA: Insufficient documentation

## 2014-11-06 DIAGNOSIS — E780 Pure hypercholesterolemia: Secondary | ICD-10-CM | POA: Insufficient documentation

## 2014-11-06 DIAGNOSIS — I10 Essential (primary) hypertension: Secondary | ICD-10-CM | POA: Insufficient documentation

## 2014-11-06 DIAGNOSIS — Z7982 Long term (current) use of aspirin: Secondary | ICD-10-CM | POA: Insufficient documentation

## 2014-11-06 DIAGNOSIS — M1711 Unilateral primary osteoarthritis, right knee: Secondary | ICD-10-CM | POA: Insufficient documentation

## 2014-11-06 DIAGNOSIS — Z7951 Long term (current) use of inhaled steroids: Secondary | ICD-10-CM | POA: Insufficient documentation

## 2014-11-06 DIAGNOSIS — Z7952 Long term (current) use of systemic steroids: Secondary | ICD-10-CM | POA: Insufficient documentation

## 2014-11-06 DIAGNOSIS — K219 Gastro-esophageal reflux disease without esophagitis: Secondary | ICD-10-CM | POA: Insufficient documentation

## 2014-11-06 MED ORDER — MELOXICAM 7.5 MG PO TABS
7.5000 mg | ORAL_TABLET | Freq: Every day | ORAL | Status: DC
Start: 2014-11-06 — End: 2014-11-06

## 2014-11-06 MED ORDER — OXYCODONE-ACETAMINOPHEN 5-325 MG PO TABS
1.0000 | ORAL_TABLET | Freq: Once | ORAL | Status: AC
  Administered 2014-11-06: 1 via ORAL
  Filled 2014-11-06: qty 1

## 2014-11-06 MED ORDER — IBUPROFEN 800 MG PO TABS
800.0000 mg | ORAL_TABLET | Freq: Once | ORAL | Status: AC
  Administered 2014-11-06: 800 mg via ORAL
  Filled 2014-11-06: qty 1

## 2014-11-06 MED ORDER — DICLOFENAC SODIUM 75 MG PO TBEC: 75.0000 mg | DELAYED_RELEASE_TABLET | Freq: Two times a day (BID) | ORAL | Status: DC

## 2014-11-06 MED ORDER — OXYCODONE-ACETAMINOPHEN 5-325 MG PO TABS: 1.0000 | ORAL_TABLET | ORAL | Status: DC | PRN

## 2014-11-06 NOTE — Discharge Instructions (Signed)
Osteoarthritis °Osteoarthritis is a disease that causes soreness and inflammation of a joint. It occurs when the cartilage at the affected joint wears down. Cartilage acts as a cushion, covering the ends of bones where they meet to form a joint. Osteoarthritis is the most common form of arthritis. It often occurs in older people. The joints affected most often by this condition include those in the: °· Ends of the fingers. °· Thumbs. °· Neck. °· Lower back. °· Knees. °· Hips. °CAUSES  °Over time, the cartilage that covers the ends of bones begins to wear away. This causes bone to rub on bone, producing pain and stiffness in the affected joints.  °RISK FACTORS °Certain factors can increase your chances of having osteoarthritis, including: °· Older age. °· Excessive body weight. °· Overuse of joints. °· Previous joint injury. °SIGNS AND SYMPTOMS  °· Pain, swelling, and stiffness in the joint. °· Over time, the joint may lose its normal shape. °· Small deposits of bone (osteophytes) may grow on the edges of the joint. °· Bits of bone or cartilage can break off and float inside the joint space. This may cause more pain and damage. °DIAGNOSIS  °Your health care provider will do a physical exam and ask about your symptoms. Various tests may be ordered, such as: °· X-rays of the affected joint. °· An MRI scan. °· Blood tests to rule out other types of arthritis. °· Joint fluid tests. This involves using a needle to draw fluid from the joint and examining the fluid under a microscope. °TREATMENT  °Goals of treatment are to control pain and improve joint function. Treatment plans may include: °· A prescribed exercise program that allows for rest and joint relief. °· A weight control plan. °· Pain relief techniques, such as: °¨ Properly applied heat and cold. °¨ Electric pulses delivered to nerve endings under the skin (transcutaneous electrical nerve stimulation [TENS]). °¨ Massage. °¨ Certain nutritional  supplements. °· Medicines to control pain, such as: °¨ Acetaminophen. °¨ Nonsteroidal anti-inflammatory drugs (NSAIDs), such as naproxen. °¨ Narcotic or central-acting agents, such as tramadol. °¨ Corticosteroids. These can be given orally or as an injection. °· Surgery to reposition the bones and relieve pain (osteotomy) or to remove loose pieces of bone and cartilage. Joint replacement may be needed in advanced states of osteoarthritis. °HOME CARE INSTRUCTIONS  °· Take medicines only as directed by your health care provider. °· Maintain a healthy weight. Follow your health care provider's instructions for weight control. This may include dietary instructions. °· Exercise as directed. Your health care provider can recommend specific types of exercise. These may include: °¨ Strengthening exercises. These are done to strengthen the muscles that support joints affected by arthritis. They can be performed with weights or with exercise bands to add resistance. °¨ Aerobic activities. These are exercises, such as brisk walking or low-impact aerobics, that get your heart pumping. °¨ Range-of-motion activities. These keep your joints limber. °¨ Balance and agility exercises. These help you maintain daily living skills. °· Rest your affected joints as directed by your health care provider. °· Keep all follow-up visits as directed by your health care provider. °SEEK MEDICAL CARE IF:  °· Your skin turns red. °· You develop a rash in addition to your joint pain. °· You have worsening joint pain. °· You have a fever along with joint or muscle aches. °SEEK IMMEDIATE MEDICAL CARE IF: °· You have a significant loss of weight or appetite. °· You have night sweats. °FOR MORE   [place] of Arthritis and Musculoskeletal and Skin Diseases: www.niams.SouthExposed.es  [employer] on Aging: http://kim-miller.com/  [employer] of Rheumatology: www.rheumatology.org Document Released: 03/12/2005 Document Revised:  07/27/2013 Document Reviewed: 11/17/2012 [facility] Patient Information 2015 [place], [place]. This information is not intended to replace advice given to you by your health care provider. Make sure you discuss any questions you have with your health care provider.  You  may use the medication prescribed for increased pain control in place of your hydrocodone.  I recommend adding your anti-inflammatory pain reliever as well.  Follow-up with your orthopedic provider in [facility] if your symptoms persist.  Elevation and heat application 20 minutes several times daily may also offer relief.

## 2014-11-06 NOTE — ED Notes (Signed)
Pt states pain to right knee, described as throbbing and tightness to the bend of the knee. Hx of two previous knee surgeries to same knee.

## 2014-11-06 NOTE — ED Provider Notes (Signed)
CSN: [id number]     Arrival date & time 11/06/14  0757 History   First MD Initiated Contact with Patient 11/06/14 [phone]     Chief Complaint  Patient presents with  . Knee Pain     (Consider location/radiation/quality/duration/timing/severity/associated sxs/prior Treatment) The history is provided by the patient.   Shannon Gallegos is a 64 y.o. female with a past medical history of hypertension, GERD and hypercholesterolemia in a significant history of prior right knee surgery secondary to trauma presenting with acute on chronic right knee pain.  She endorses she has recently been seen by an orthopedist at [facility] who has recommended a complete right knee replacement due to advanced arthritis which she has been avoiding.  Since yesterday she has had increased pain described as throbbing in the knee joint radiating to her posterior knee despite any known injuries or overuse.  She feels tight in the knee and an weakness like it might "buckle".  There is no radiation of pain.  Pain is worsened with attempts at weightbearing and with flexion.  She has used salon pas pads and her regular hydrocodone with no improvement in pain today.     Past Medical History  Diagnosis Date  . Borderline diabetes   . Hypertension   . Hypercholesterolemia   . GERD (gastroesophageal reflux disease)    Past Surgical History  Procedure Laterality Date  . Knee surgery      both  . Tubal ligation    . Colonoscopy  March 2014    Dr. Ellender Hose: sigmoid and descending colon diverticula, ADENOMATOUS polyp. Due for surveillance 2019  . Esophagogastroduodenoscopy (egd) with esophageal dilation N/A 12/09/2012    Dr. Oneida Alar: small hiatal hernia, Schatzki's ring at GE junction s/p Savary dilation, benign sessile polyps, moderate gastritis   Family History  Problem Relation Age of Onset  . Colon cancer Neg Hx   . Cirrhosis Father     alcoholic   Social History  Substance Use Topics  . Smoking status: Never Smoker    . Smokeless tobacco: None  . Alcohol Use: No   OB History    No data available     Review of Systems  Constitutional: Negative for fever.  Musculoskeletal: Positive for joint swelling and arthralgias. Negative for myalgias.  Neurological: Negative for weakness and numbness.      Allergies  Review of patient's allergies indicates no known allergies.  Home Medications   Prior to Admission medications   Medication Sig Start Date End Date Taking? Authorizing Provider  aspirin EC 81 MG tablet Take 81 mg by mouth daily.   Yes Historical Provider, MD  dexlansoprazole (DEXILANT) 60 MG capsule Take 60 mg by mouth daily.   Yes Historical Provider, MD  diclofenac (VOLTAREN) 75 MG EC tablet Take 75 mg by mouth 2 (two) times daily. 12/02/13  Yes Historical Provider, MD  diphenhydramine-acetaminophen (TYLENOL PM) 25-500 MG TABS Take 1 tablet by mouth at bedtime as needed.   Yes Historical Provider, MD  fluticasone (FLONASE) 50 MCG/ACT nasal spray Place 2 sprays into the nose daily.   Yes Historical Provider, MD  gabapentin (NEURONTIN) 300 MG capsule Take 300 mg by mouth 3 (three) times daily.   Yes Historical Provider, MD  Melatonin 5 MG TABS Take 5 mg by mouth at bedtime.    Yes Historical Provider, MD  pravastatin (PRAVACHOL) 40 MG tablet Take 40 mg by mouth daily.   Yes Historical Provider, MD  triamterene-hydrochlorothiazide (DYAZIDE) 50-25 MG per capsule Take 1  capsule by mouth every morning.   Yes Historical Provider, MD  meclizine (ANTIVERT) 25 MG tablet Take 25 mg by mouth 3 (three) times daily as needed for dizziness.     Historical Provider, MD  oxyCODONE-acetaminophen (PERCOCET/ROXICET) 5-325 MG per tablet Take 1 tablet by mouth every 4 (four) hours as needed. 11/06/14   Evalee Jefferson, PA-C  traMADol (ULTRAM) 50 MG tablet Take 50 mg by mouth every 6 (six) hours as needed for pain.    Historical Provider, MD  triamcinolone (KENALOG) 0.025 % cream Apply 1 application topically 3 (three)  times daily.    Historical Provider, MD   There were no vitals taken for this visit. Physical Exam  Constitutional: She appears well-developed and well-nourished.  HENT:  Head: Atraumatic.  Neck: Normal range of motion.  Cardiovascular:  Pulses equal bilaterally  Musculoskeletal: She exhibits edema and tenderness.       Right knee: She exhibits decreased range of motion and effusion. She exhibits no LCL laxity and no MCL laxity. Tenderness found. Medial joint line and lateral joint line tenderness noted.  Crepitus with flexion of the right knee.  She also has crepitus noted with gentle movement of the patella.  Edema noted upper outer quadrant of knee, no erythema.  Calf is nontender there is no ankle edema.  She has a good full dorsalis pedal pulse.  Calf and thigh is soft and nontender.  Neurological: She is alert. She has normal strength. She displays normal reflexes. No sensory deficit.  Skin: Skin is warm and dry.  Psychiatric: She has a normal mood and affect.    ED Course  Procedures (including critical care time) Labs Review Labs Reviewed - No data to display  Imaging Review Dg Knee Complete 4 Views Right  11/06/2014   CLINICAL DATA:  Right knee pain and swelling without reported injury.  EXAM: RIGHT KNEE - COMPLETE 4+ VIEW  COMPARISON:  Aug 07, 2012.  FINDINGS: There is no evidence of fracture, dislocation, or joint effusion. Mild tricompartmental joint space narrowing is noted. Soft tissues are unremarkable.  IMPRESSION: Mild tricompartmental degenerative joint disease is noted. No acute abnormality seen in the right knee.   Electronically Signed   By: Marijo Conception, M.D.   On: 11/06/2014 09:25     EKG Interpretation None      MDM   Final diagnoses:  Primary osteoarthritis of right knee    Patients labs and/or radiological studies were reviewed and considered during the medical decision making and disposition process. Results were also discussed with patient.    Imaging was viewed, interpreted and I agree with radiologists reading.     Pt was prescribed oxycodone, to start taking her anti inflammatory. F/u with ortho, referral given for Dr. Aline Brochure as pt states will be unable to f/u at [facility]. The patient appears reasonably screened and/or stabilized for discharge and I doubt any other medical condition or other [facility] requiring further screening, evaluation, or treatment in the ED at this time prior to discharge.   Evalee Jefferson, PA-C 11/06/14 0936  Evalee Jefferson, PA-C 11/06/14 1747  Milton Ferguson, MD 11/07/14 [phone]

## 2014-11-10 ENCOUNTER — Telehealth: Payer: Self-pay | Admitting: Orthopedic Surgery

## 2014-11-10 NOTE — Telephone Encounter (Signed)
Patient called to request appointment following Emergency Room visit of 11/06/14 for problem of right knee pain.  Discussed and offered appointment; states her 100% [employer] discount is pending.  Aware that the visit would be self-pay if schedules at this time; patient elects to wait until re-application of discount is approved, and will call back at that time.  Her ph#'s are: (cell) [phone] and (home) [phone].

## 2014-11-11 ENCOUNTER — Telehealth: Payer: Self-pay | Admitting: Gastroenterology

## 2014-11-11 NOTE — Telephone Encounter (Signed)
Patient called this afternoon asking about [facility] Assistance. She is reapplying and wanted [place] to check to see if we had her tax papers because she can't find them. I looked thru everything that was scanned and didn't see anything. She had asked to speak with CM when she first called and was told CM would be back in the morning and then she asked for me. I told her that I would have CM call her back tomorrow and check behind me in case I overlooked anything. [phone] or [phone]

## 2014-11-12 NOTE — Telephone Encounter (Signed)
I instructed the patient to call whomever prepared her tax return back in 2013 to see if they have a copy.  She stated she will do that and bring it to our office for me to fax to the financial assistance department, she thanked me for my help and ended the call.

## 2014-11-12 NOTE — Telephone Encounter (Signed)
Per Shannon Gallegos she wanted to renew her financial assistance, however she can't find her 2013 tax return.

## 2015-01-04 ENCOUNTER — Ambulatory Visit: Payer: Self-pay | Admitting: Orthopedic Surgery

## 2015-01-19 ENCOUNTER — Telehealth: Payer: Self-pay | Admitting: Orthopedic Surgery

## 2015-01-19 NOTE — Telephone Encounter (Signed)
Opened in Error.

## 2015-01-27 ENCOUNTER — Ambulatory Visit (INDEPENDENT_AMBULATORY_CARE_PROVIDER_SITE_OTHER): Payer: Self-pay | Admitting: Orthopedic Surgery

## 2015-01-27 VITALS — BP 136/88 | Ht 62.0 in | Wt 213.0 lb

## 2015-01-27 DIAGNOSIS — M1712 Unilateral primary osteoarthritis, left knee: Secondary | ICD-10-CM

## 2015-01-27 DIAGNOSIS — M1711 Unilateral primary osteoarthritis, right knee: Secondary | ICD-10-CM

## 2015-01-27 DIAGNOSIS — M5441 Lumbago with sciatica, right side: Secondary | ICD-10-CM

## 2015-01-27 DIAGNOSIS — M25562 Pain in left knee: Secondary | ICD-10-CM

## 2015-01-27 NOTE — Patient Instructions (Addendum)
CALL [facility] THERAPY DEPT TO SCHEDULE THERAPY VISITS  Joint Injection Care After Refer to this sheet in the next few days. These instructions provide you with information on caring for yourself after you have had a joint injection. Your caregiver also may give you more specific instructions. Your treatment has been planned according to current medical practices, but problems sometimes occur. Call your caregiver if you have any problems or questions after your procedure. After any type of joint injection, it is not uncommon to experience:  Soreness, swelling, or bruising around the injection site.  Mild numbness, tingling, or weakness around the injection site caused by the numbing medicine used before or with the injection. It also is possible to experience the following effects associated with the specific agent after injection:  Iodine-based contrast agents:  Allergic reaction (itching, hives, widespread redness, and swelling beyond the injection site).  Corticosteroids (These effects are rare.):  Allergic reaction.  Increased blood sugar levels (If you have diabetes and you notice that your blood sugar levels have increased, notify your caregiver).  Increased blood pressure levels.  Mood swings.  Hyaluronic acid in the use of viscosupplementation.  Temporary heat or redness.  Temporary rash and itching.  Increased fluid accumulation in the injected joint. These effects all should resolve within a day after your procedure.  HOME CARE INSTRUCTIONS  Limit yourself to light activity the day of your procedure. Avoid lifting heavy objects, bending, stooping, or twisting.  Take prescription or over-the-counter pain medication as directed by your caregiver.  You may apply ice to your injection site to reduce pain and swelling the day of your procedure. Ice may be applied 03-04 times:  Put ice in a plastic bag.  Place a towel between your skin and the bag.  Leave the ice on for no  longer than 15-20 minutes each time. SEEK IMMEDIATE MEDICAL CARE IF:   Pain and swelling get worse rather than better or extend beyond the injection site.  Numbness does not go away.  Blood or fluid continues to leak from the injection site.  You have chest pain.  You have swelling of your face or tongue.  You have trouble breathing or you become dizzy.  You develop a fever, chills, or severe tenderness at the injection site that last longer than 1 day. MAKE SURE YOU:  Understand these instructions.  Watch your condition.  Get help right away if you are not doing well or if you get worse. Document Released: 11/23/2010 Document Revised: 06/04/2011 Document Reviewed: 11/23/2010 Exit[employer] Patient Information 2015 [employer]. This information is not intended to replace advice given to you by your health care provider. Make sure you discuss any questions you have with your health care provider.  

## 2015-01-27 NOTE — Progress Notes (Signed)
Shannon Gallegos is a 64 y.o. female   HPI:  Knee Pain: This 64 year old female actually presents with bilateral knee pain. She's had multiple surgeries on both knees. The right knee secondary to injuries back in 1980 details unclear at this. She does note dislocated kneecap on the right with torn meniscus and ligament repair 2 surgeries within 6 months and then in 2000 she had left knee arthroscopy due to wear and tear questionable if meniscus removed area she presents on the [employer] discount with pain swelling stiffness and atraumatic giving way symptoms of the knees right worse than left. She complains of sharp throbbing stabbing aching radiating pain which is constant morning and night and prevents her from doing any activity such as walking up and down the stairs standing to do housework kitchen activities such as cooking or washing dishes. She has been treated with hydrocodone, diclofenac and Tylenol after her ER visit she had oxycodone and ibuprofen  She had full injections back when her knees are undergoing surgeries and presents now for evaluation and treatment  Her review of systems is notable for constipation GU system is normal she does complaints of night sweats and fatigue some sore throat issues with dental problems vision problems holes fluid in her legs with edema depression anxiety and does complain of back pain and gait disturbance with the legs giving way. Dizziness weakness lightheadedness and tingling is also noted    Past Medical History  Diagnosis Date  . Borderline diabetes   . Hypertension   . Hypercholesterolemia   . GERD (gastroesophageal reflux disease)    Past Surgical History  Procedure Laterality Date  . Knee surgery      both  . Tubal ligation    . Colonoscopy  March 2014    Dr. Ellender Hose: sigmoid and descending colon diverticula, ADENOMATOUS polyp. Due for surveillance 2019  . Esophagogastroduodenoscopy (egd) with esophageal dilation N/A 12/09/2012    Dr. Oneida Alar:  small hiatal hernia, Schatzki's ring at GE junction s/p Savary dilation, benign sessile polyps, moderate gastritis    Current outpatient prescriptions:  .  acetaminophen (TYLENOL) 500 MG tablet, Take 500 mg by mouth every 6 (six) hours as needed., Disp: , Rfl:  .  atorvastatin (LIPITOR) 40 MG tablet, Take 40 mg by mouth daily., Disp: , Rfl:  .  diclofenac (VOLTAREN) 75 MG EC tablet, Take 1 tablet (75 mg total) by mouth 2 (two) times daily., Disp: 30 tablet, Rfl: 0 .  esomeprazole (NEXIUM) 40 MG capsule, Take 40 mg by mouth daily at 12 noon., Disp: , Rfl:  .  fluticasone (FLONASE) 50 MCG/ACT nasal spray, Place 2 sprays into the nose daily., Disp: , Rfl:  .  gabapentin (NEURONTIN) 300 MG capsule, Take 300 mg by mouth 3 (three) times daily., Disp: , Rfl:  .  hydrochlorothiazide (HYDRODIURIL) 25 MG tablet, Take 25 mg by mouth daily., Disp: , Rfl:  .  HYDROcodone-acetaminophen (NORCO/VICODIN) 5-325 MG tablet, Take 1 tablet by mouth every 6 (six) hours as needed for moderate pain., Disp: , Rfl:  .  meclizine (ANTIVERT) 25 MG tablet, Take 25 mg by mouth 3 (three) times daily as needed for dizziness. , Disp: , Rfl:  .  traZODone (DESYREL) 50 MG tablet, Take 50 mg by mouth at bedtime., Disp: , Rfl:  .  triamcinolone (KENALOG) 0.025 % cream, Apply 1 application topically 3 (three) times daily., Disp: , Rfl:  No Known Allergies  reports that she has never smoked. She does not have any smokeless  tobacco history on file. She reports that she does not drink alcohol or use illicit drugs. Family History  Problem Relation Age of Onset  . Colon cancer Neg Hx   . Cirrhosis Father     alcoholic    Right knee 1 transverse incision connected to a vertical incision on the medial side of the joint. Knee flexion is 120. There is no warmth to the joint. All ligaments had normal endpoints McMurray's negative. She was hypersensitive quadriceps patellar tendon and then tender medial lateral joint line but intact  quadriceps strength  Left knee no erythema or effusion. No warmth to the joint she had joint line tenderness some crepitance flexion was approximately 120. Ligaments have solid end points. McMurray sign negative. Patella compression test positive. Strength test for quadriceps normal.  She had intense lower back tenderness to palpation right greater than left but bilateral.  The right knee film that I reviewed shows she has mild arthritis all 3 compartments and in a left knee film that I saw from 2014 also shows symmetric joint space narrowing approximately 50%  Diagnosis lumbar spine disease recommend physical therapy. Call after 6 weeks if no improvement we will try to get her set up with a neurosurgeon MRI etc.  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 She is not a candidate for knee replacement surgery  She did go to [facility] they evaluated and treated her and diagnosis with osteoarthritis and need for knee replacement which I disagree with. I don't think she'll do well with that. She is too sensitive to mild provocation maneuvers  I think she really has a back problem with mild arthritis of her knees.

## 2015-02-02 ENCOUNTER — Ambulatory Visit (HOSPITAL_COMMUNITY): Payer: Self-pay | Attending: Orthopedic Surgery | Admitting: Physical Therapy

## 2015-02-02 DIAGNOSIS — M25562 Pain in left knee: Secondary | ICD-10-CM | POA: Insufficient documentation

## 2015-02-02 DIAGNOSIS — M25659 Stiffness of unspecified hip, not elsewhere classified: Secondary | ICD-10-CM | POA: Insufficient documentation

## 2015-02-02 DIAGNOSIS — R262 Difficulty in walking, not elsewhere classified: Secondary | ICD-10-CM | POA: Insufficient documentation

## 2015-02-02 DIAGNOSIS — M25561 Pain in right knee: Secondary | ICD-10-CM | POA: Insufficient documentation

## 2015-02-02 DIAGNOSIS — R6889 Other general symptoms and signs: Secondary | ICD-10-CM | POA: Insufficient documentation

## 2015-02-02 DIAGNOSIS — M5441 Lumbago with sciatica, right side: Secondary | ICD-10-CM | POA: Insufficient documentation

## 2015-02-02 NOTE — Therapy (Signed)
[facility] [address] [place], [place], [address] Phone: [phone]   Fax:  [phone]  Physical Therapy Evaluation  Patient Details  Name: Shannon Gallegos MRN: [id number] Date of Birth: [birth year] Referring Provider: Aline Brochure  Encounter Date: 02/02/2015      PT End of Session - 02/02/15 1259    Visit Number 1   Number of Visits 18   Date for PT Re-Evaluation 03/04/15   Authorization Type CAFA   Authorization Time Period 02/02/15-04/04/15   PT Start Time 0845   PT Stop Time 0935   PT Time Calculation (min) 50 min   Activity Tolerance Patient tolerated treatment well   Behavior During Therapy [facility] for tasks assessed/performed      Past Medical History  Diagnosis Date  . Borderline diabetes   . Hypertension   . Hypercholesterolemia   . GERD (gastroesophageal reflux disease)     Past Surgical History  Procedure Laterality Date  . Knee surgery      both  . Tubal ligation    . Colonoscopy  March 2014    Dr. Ellender Hose: sigmoid and descending colon diverticula, ADENOMATOUS polyp. Due for surveillance 2019  . Esophagogastroduodenoscopy (egd) with esophageal dilation N/A 12/09/2012    Dr. Oneida Alar: small hiatal hernia, Schatzki's ring at GE junction s/p Savary dilation, benign sessile polyps, moderate gastritis    There were no vitals filed for this visit.  Visit Diagnosis:  Midline low back pain with right-sided sciatica  Bilateral knee pain  Difficulty walking  Hip stiffness, unspecified laterality  Decreased functional activity tolerance      Subjective Assessment - 02/02/15 0847    Subjective Pt reports that she has been having severe knee pain, but she went to Dr. Aline Brochure who told her that he thinks the problem is coming from her back. She reports that she has been having back pain, mainly on the R side down into her hip. Pt reports that her knees have bothered her for about 30 years. She has had multiple surgeries on  her R knee, and one surgery on her L knee. She reports that her back and knee pain is relatively constant, and it is difficult for her to find a position of comfort. Pt reports that at times, she will be walking, and her knee will give out on her and she will fall.  She has difficulty with squatting or stooping, walking long distances, standing for extended periods of time, and going up and down stairs.    How long can you sit comfortably? 20-30 minutes   How long can you stand comfortably? 15 minutes   How long can you walk comfortably? <15 minutes   Patient Stated Goals Return to walking long distances, decrease pain to be able to stand and cook meal   Currently in Pain? Yes   Pain Score 5    Pain Location Back   Pain Orientation Right   Multiple Pain Sites Yes   Pain Score 5   Pain Location Knee   Pain Orientation Right;Left            [facility] PT Assessment - 02/02/15 0001    Assessment   Medical Diagnosis R sided LBP   Referring Provider Aline Brochure   Onset Date/Surgical Date --  Back pain since 2013   Prior Therapy Yes- a year ago. Consisted of manual therapy, dry needling, therex   Precautions   Precautions None   Restrictions   Weight Bearing Restrictions No  Balance Screen   Has the patient fallen in the past 6 months No   Has the patient had a decrease in activity level because of a fear of falling?  Yes   Is the patient reluctant to leave their home because of a fear of falling?  No   Home Environment   Living Environment Private residence   Living Arrangements Spouse/significant other   Type of [place] Access Level entry   Home Layout Two level;Bed/bath upstairs   Alternate Level Stairs-Number of Steps 12-15   Alternate Level Stairs-Rails Right;Left;Can reach both   Prior Function   Level of Independence Independent   Vocation On disability   Leisure Enjoys going to grandchildren's sporting events, host family gatherings, cook big meals    Observation/Other Assessments   Focus on Therapeutic Outcomes (FOTO)  59% limited   ROM / Strength   AROM / PROM / Strength AROM;Strength   AROM   AROM Assessment Site Lumbar;Hip   Right Hip External Rotation  39   Right Hip Internal Rotation  33   Left Hip External Rotation  27   Left Hip Internal Rotation  30   Lumbar Flexion 56   Lumbar Extension 16   Lumbar - Right Side Bend 35   Lumbar - Left Side Bend 25   Strength   Strength Assessment Site Hip;Knee   Right Hip Flexion 3-/5   Right Hip Extension 2+/5   Right Hip ABduction 3-/5   Left Hip Flexion 3+/5   Left Hip Extension 3-/5   Left Hip ABduction 3/5   Right/Left Knee Right;Left   Right Knee Flexion 3+/5   Right Knee Extension 3-/5   Left Knee Flexion 3+/5   Left Knee Extension 3-/5   Special Tests    Special Tests Lumbar   Lumbar Tests FABER test;Straight Leg Raise   FABER test   findings Positive   Side --  bilateral   Straight Leg Raise   Findings Positive   Side  Right   Transfers   Five time sit to stand comments  70.57"                             PT Short Term Goals - 02/02/15 1746    PT SHORT TERM GOAL #1   Title Pt will be independent with HEP.   Time 3   Period Weeks   Status New   PT SHORT TERM GOAL #2   Title Improve lumbar flexion ROM to 65 degrees or greater to improve ability to bend forward.   Time 3   Period Weeks   Status New   PT SHORT TERM GOAL #3   Title Improve strength of BLE to 4-/5 or greater to improve functional mobility and gait mechanics.    Time 3   Period Weeks   Status New   PT SHORT TERM GOAL #4   Title Improve functional mobility as evidenced by five time sit to stand time of 50 seconds with UE support.    Time 3   Period Weeks   Status New   PT SHORT TERM GOAL #5   Title Pt will ambulate 400 feet during 3 minute walk test to demonstrate improved gait speed.    Time 3   Period Weeks           PT Long Term Goals - 02/02/15 1749    PT  LONG TERM GOAL #1  Title Pt will be independent with advanced HEP.   Time 6   Period Weeks   Status New   PT LONG TERM GOAL #2   Title Improve lumbar ROM to 75 degrees of flexion or greater with 1/0 pain to allow pt to bend forward to complete self care.    Time 6   Period Weeks   Status New   PT LONG TERM GOAL #3   Title Increase BLE strength to 4+/5 to improve gait mechanics and ability to climb stairs without pain.   Time 6   Period Weeks   Status New   PT LONG TERM GOAL #4   Title Improve functional mobility and BLE strength evidenced by five time sit to stand time of 30 seconds or less without UE support   Time 6   Period Weeks   Status New   PT LONG TERM GOAL #5   Title Pt will ambulate 1,000 feet with equal step length, proper heel strike, and <3/10 pain to return pt to community ambulation.    Time 6   Period Weeks   Status New   PT LONG TERM GOAL #6   Title Pt will ascend/descend 12 steps with reciprocal pattern, one handrail, and <3/10 pain.    Time 6   Period Weeks   Status New               Plan - 02/02/15 1738    Clinical Impression Statement Pt presents to PT with complaints of long-standing knee pain and LBP that has been radiating into R buttock. A large part of the initial evaluation was spent on subjective, due to pt's extensive history, with pt education given regarding her symptoms and prognosis for PT. Upon examination, pt demonstrates decrease lumbar ROM with pain during flexion and R sidebending, decreased ROM of bilateral hips, decreased strength of BLE, positive SLR on RLE, decrease functional activity tolerance, and tenderness with palpation over R piriformis, lumbar paraspinals, ITB, and L medial quads. Further examination will be required next session due to time constraints in order to evaluate gait mechanics and gait speed. Skilled physical therapy services are necessary at this time to address pt's impairments in order to return her to optimal  level of function. MD wants to focus treatment on core stabilization at this time.    Pt will benefit from skilled therapeutic intervention in order to improve on the following deficits Decreased activity tolerance;Decreased endurance;Decreased mobility;Decreased range of motion;Decreased strength;Difficulty walking;Hypomobility;Pain   Rehab Potential Fair   Clinical Impairments Affecting Rehab Potential Fair prognosis due to chronicity of sx   PT Frequency 3x / week   PT Duration 6 weeks   PT Treatment/Interventions ADLs/Self Care Home Management;Cryotherapy;Electrical Stimulation;Moist Heat;Gait training;Stair training;Functional mobility training;Therapeutic activities;Therapeutic exercise;Balance training;Patient/family education;Manual techniques;Passive range of motion   PT Next Visit Plan Perform 3 minute walk test to get baseline gait speed and TUG         Problem List Patient Active Problem List   Diagnosis Date Noted  . Right flank discomfort 06/04/2013  . Dyspepsia 04/23/2013  . Dry mouth 04/23/2013  . GERD (gastroesophageal reflux disease) 11/20/2012  . Dysphagia, unspecified(787.20) 11/20/2012  . Adenomatous polyp 11/20/2012    Hilma Favors, PT, DPT [phone] 02/02/2015, 5:56 PM  [facility] [address] [place], [place], [address] Phone: [phone]   Fax:  [phone]  Name: Shannon Gallegos MRN: [id number] Date of Birth: [birth year]

## 2015-02-07 ENCOUNTER — Ambulatory Visit (HOSPITAL_COMMUNITY): Payer: Self-pay | Admitting: Physical Therapy

## 2015-02-07 DIAGNOSIS — M5441 Lumbago with sciatica, right side: Secondary | ICD-10-CM

## 2015-02-07 DIAGNOSIS — R262 Difficulty in walking, not elsewhere classified: Secondary | ICD-10-CM

## 2015-02-07 DIAGNOSIS — M25562 Pain in left knee: Secondary | ICD-10-CM

## 2015-02-07 DIAGNOSIS — M25561 Pain in right knee: Secondary | ICD-10-CM

## 2015-02-07 DIAGNOSIS — M25659 Stiffness of unspecified hip, not elsewhere classified: Secondary | ICD-10-CM

## 2015-02-07 NOTE — Patient Instructions (Signed)
Isometric Abdominal    Lying on back with knees bent, tighten stomach by pressing elbows down. Hold __3__ seconds. Repeat __10__ times per set. Do __1__ sets per session. Do __2__ sessions per day.  http://orth.exer.us/1086   Copyright  VHI. All rights reserved.  Knee Roll    Lying on back, with knees bent and feet flat on floor, arms outstretched to sides, slowly roll both knees to side, hold 5 seconds. Back to starting position, hold 5 seconds. Then to opposite side, hold 5 seconds. Return to starting position. Keep shoulders and arms in contact with floor.  Do 10 times to each side.   Copyright  VHI. All rights reserved.  Bridging    Slowly raise buttocks from floor, keeping stomach tight. Repeat __10__ times per set. Do _1___ sets per session. Do __2__ sessions per day.  http://orth.exer.us/1097   Copyright  VHI. All rights reserved.  Clam Shell 45 Degrees    Lying with hips and knees bent 45, one pillow between knees and ankles. Lift knee. Be sure pelvis does not roll backward. Do not arch back. Do _10__ times, each leg, _2__ times per day.  http://ss.exer.us/75   Copyright  VHI. All rights reserved.

## 2015-02-07 NOTE — Therapy (Signed)
[place], [place], [address] Phone: [phone]   Fax:  [phone]  Physical Therapy Treatment  Patient Details  Name: Shannon Gallegos MRN: [id number] Date of Birth: [birth year] Referring Provider: Aline Brochure  Encounter Date: 02/07/2015      PT End of Session - 02/07/15 1551    Visit Number 2   Number of Visits 18   Date for PT Re-Evaluation 03/04/15   Authorization Type CAFA   Authorization Time Period 02/02/15-04/04/15   PT Start Time 0850   PT Stop Time 0940   PT Time Calculation (min) 50 min   Activity Tolerance Patient tolerated treatment well   Behavior During Therapy [facility] for tasks assessed/performed      Past Medical History  Diagnosis Date  . Borderline diabetes   . Hypertension   . Hypercholesterolemia   . GERD (gastroesophageal reflux disease)     Past Surgical History  Procedure Laterality Date  . Knee surgery      both  . Tubal ligation    . Colonoscopy  March 2014    Dr. Ellender Hose: sigmoid and descending colon diverticula, ADENOMATOUS polyp. Due for surveillance 2019  . Esophagogastroduodenoscopy (egd) with esophageal dilation N/A 12/09/2012    Dr. Oneida Alar: small hiatal hernia, Schatzki's ring at GE junction s/p Savary dilation, benign sessile polyps, moderate gastritis    There were no vitals filed for this visit.  Visit Diagnosis:  Midline low back pain with right-sided sciatica  Bilateral knee pain  Difficulty walking  Hip stiffness, unspecified laterality      Subjective Assessment - 02/07/15 0853    Subjective Pt reports that she feels stiff and sore today. Most of the soreness is in her low back today. She has been having some sharp pain from her low back down the back of her L leg recently. She also has some soreness on the R side of her low back.    Currently in Pain? Yes   Pain Score 5    Pain Location Back            [facility] PT Assessment - 02/07/15 0001    6 minute  walk test results    Aerobic Endurance Distance Walked 505   Endurance additional comments 3MWT             [facility] Adult PT Treatment/Exercise - 02/07/15 0001    Posture/Postural Control   Posture Comments Pt's L hip higher during gait and standing   Exercises   Exercises Lumbar   Lumbar Exercises: Stretches   Lower Trunk Rotation Limitations 10 reps x 3 seconds bilat   Lumbar Exercises: Supine   Ab Set 10 reps;3 seconds   Bridge 10 reps   Other Supine Lumbar Exercises bent knee fall out with ab set x 10 bilat   Manual Therapy   Manual Therapy Muscle Energy Technique   Muscle Energy Technique Isometric hip extension with LLE in supine, isometric hip flexion with RLE in prone                PT Education - 02/07/15 1551    Education provided Yes   Education Details Goals reviewed, HEP given   Person(s) Educated Patient   Methods Explanation;Handout   Comprehension Verbalized understanding;Returned demonstration          PT Short Term Goals - 02/02/15 1746    PT SHORT TERM GOAL #1   Title Pt will be independent with HEP.   Time 3  Period Weeks   Status New   PT SHORT TERM GOAL #2   Title Improve lumbar flexion ROM to 65 degrees or greater to improve ability to bend forward.   Time 3   Period Weeks   Status New   PT SHORT TERM GOAL #3   Title Improve strength of BLE to 4-/5 or greater to improve functional mobility and gait mechanics.    Time 3   Period Weeks   Status New   PT SHORT TERM GOAL #4   Title Improve functional mobility as evidenced by five time sit to stand time of 50 seconds with UE support.    Time 3   Period Weeks   Status New   PT SHORT TERM GOAL #5   Title Pt will ambulate 400 feet during 3 minute walk test to demonstrate improved gait speed.    Time 3   Period Weeks           PT Long Term Goals - 02/02/15 1749    PT LONG TERM GOAL #1   Title Pt will be independent with advanced HEP.   Time 6   Period Weeks   Status New    PT LONG TERM GOAL #2   Title Improve lumbar ROM to 75 degrees of flexion or greater with 1/0 pain to allow pt to bend forward to complete self care.    Time 6   Period Weeks   Status New   PT LONG TERM GOAL #3   Title Increase BLE strength to 4+/5 to improve gait mechanics and ability to climb stairs without pain.   Time 6   Period Weeks   Status New   PT LONG TERM GOAL #4   Title Improve functional mobility and BLE strength evidenced by five time sit to stand time of 30 seconds or less without UE support   Time 6   Period Weeks   Status New   PT LONG TERM GOAL #5   Title Pt will ambulate 1,000 feet with equal step length, proper heel strike, and <3/10 pain to return pt to community ambulation.    Time 6   Period Weeks   Status New   PT LONG TERM GOAL #6   Title Pt will ascend/descend 12 steps with reciprocal pattern, one handrail, and <3/10 pain.    Time 6   Period Weeks   Status New               Plan - 02/07/15 1551    Clinical Impression Statement Pt presented to PT today with c/o hip and LBP on L side greater than R side. 3 minute walk test was completed today due to running out of time in initial eval, pt ambulated 505 feet with no AD, reported increased pain and fatigue following test. PT examined pelvis, pt's L hip was higher in standing. In supine, pt's LLE was longer than RLE. Leg length was measured and found to be equal. MET was completed with slight resolution of LLD, pt reported some decrease in pain following MET. Lumbar stabilization exercises were initiated today, pt required verbal and tactile cueing to maintain abdominal contraction with supine therex.    PT Next Visit Plan Continue with core stabilization, check SI alignment        Problem List Patient Active Problem List   Diagnosis Date Noted  . Right flank discomfort 06/04/2013  . Dyspepsia 04/23/2013  . Dry mouth 04/23/2013  . GERD (gastroesophageal reflux disease) 11/20/2012  .  Dysphagia,  unspecified(787.20) 11/20/2012  . Adenomatous polyp 11/20/2012    Hilma Favors, PT, DPT [phone] 02/07/2015, 3:57 PM  [place], [place], [address] Phone: [phone]   Fax:  [phone]  Name: Shannon Gallegos MRN: [id number] Date of Birth: [birth year]

## 2015-02-09 ENCOUNTER — Ambulatory Visit (HOSPITAL_COMMUNITY): Payer: Self-pay | Admitting: Physical Therapy

## 2015-02-09 DIAGNOSIS — M25562 Pain in left knee: Secondary | ICD-10-CM

## 2015-02-09 DIAGNOSIS — M5441 Lumbago with sciatica, right side: Secondary | ICD-10-CM

## 2015-02-09 DIAGNOSIS — M25659 Stiffness of unspecified hip, not elsewhere classified: Secondary | ICD-10-CM

## 2015-02-09 DIAGNOSIS — R6889 Other general symptoms and signs: Secondary | ICD-10-CM

## 2015-02-09 DIAGNOSIS — M25561 Pain in right knee: Secondary | ICD-10-CM

## 2015-02-09 DIAGNOSIS — R262 Difficulty in walking, not elsewhere classified: Secondary | ICD-10-CM

## 2015-02-09 NOTE — Therapy (Signed)
[place], [place], [address] Phone: [phone]   Fax:  [phone]  Physical Therapy Treatment  Patient Details  Name: Shannon Gallegos MRN: [id number] Date of Birth: 01-19-1951 Referring Provider: Aline Brochure  Encounter Date: 02/09/2015      PT End of Session - 02/09/15 1247    Visit Number 3   Number of Visits 18   Date for PT Re-Evaluation 03/04/15   Authorization Type CAFA   Authorization Time Period 02/02/15-04/04/15   PT Start Time 0845   PT Stop Time 0930   PT Time Calculation (min) 45 min   Activity Tolerance Patient tolerated treatment well   Behavior During Therapy [facility] for tasks assessed/performed      Past Medical History  Diagnosis Date  . Borderline diabetes   . Hypertension   . Hypercholesterolemia   . GERD (gastroesophageal reflux disease)     Past Surgical History  Procedure Laterality Date  . Knee surgery      both  . Tubal ligation    . Colonoscopy  March 2014    Dr. Ellender Hose: sigmoid and descending colon diverticula, ADENOMATOUS polyp. Due for surveillance 2019  . Esophagogastroduodenoscopy (egd) with esophageal dilation N/A 12/09/2012    Dr. Oneida Alar: small hiatal hernia, Schatzki's ring at GE junction s/p Savary dilation, benign sessile polyps, moderate gastritis    There were no vitals filed for this visit.  Visit Diagnosis:  Midline low back pain with right-sided sciatica  Bilateral knee pain  Difficulty walking  Hip stiffness, unspecified laterality  Decreased functional activity tolerance      Subjective Assessment - 02/09/15 0916    Subjective Pt reports that she was pretty sore following last session, and it is still present today. Reports that she still feels pretty stiff, and she has been having some pain shooting down from her hip down the back of her L leg.    Currently in Pain? Yes   Pain Score 5                          [facility] Adult PT  Treatment/Exercise - 02/09/15 0001    Exercises   Exercises Lumbar   Lumbar Exercises: Supine   Ab Set 10 reps;3 seconds   Bent Knee Raise 10 reps   Bent Knee Raise Limitations with ab set   Bridge 10 reps   Lumbar Exercises: Sidelying   Clam 10 reps   Lumbar Exercises: Prone   Other Prone Lumbar Exercises Prone heel squeeze 3"x 10   Other Prone Lumbar Exercises Prone hip extension with knee flexed x 10   Manual Therapy   Manual Therapy Soft tissue mobilization;Myofascial release   Manual therapy comments Performed prior to therex   Soft tissue mobilization to L glut med, piriformis, sacral sulcus   Myofascial Release trigger point release to L piriformis                  PT Short Term Goals - 02/02/15 1746    PT SHORT TERM GOAL #1   Title Pt will be independent with HEP.   Time 3   Period Weeks   Status New   PT SHORT TERM GOAL #2   Title Improve lumbar flexion ROM to 65 degrees or greater to improve ability to bend forward.   Time 3   Period Weeks   Status New   PT SHORT TERM GOAL #3   Title Improve strength  of BLE to 4-/5 or greater to improve functional mobility and gait mechanics.    Time 3   Period Weeks   Status New   PT SHORT TERM GOAL #4   Title Improve functional mobility as evidenced by five time sit to stand time of 50 seconds with UE support.    Time 3   Period Weeks   Status New   PT SHORT TERM GOAL #5   Title Pt will ambulate 400 feet during 3 minute walk test to demonstrate improved gait speed.    Time 3   Period Weeks           PT Long Term Goals - 02/02/15 1749    PT LONG TERM GOAL #1   Title Pt will be independent with advanced HEP.   Time 6   Period Weeks   Status New   PT LONG TERM GOAL #2   Title Improve lumbar ROM to 75 degrees of flexion or greater with 1/0 pain to allow pt to bend forward to complete self care.    Time 6   Period Weeks   Status New   PT LONG TERM GOAL #3   Title Increase BLE strength to 4+/5 to improve  gait mechanics and ability to climb stairs without pain.   Time 6   Period Weeks   Status New   PT LONG TERM GOAL #4   Title Improve functional mobility and BLE strength evidenced by five time sit to stand time of 30 seconds or less without UE support   Time 6   Period Weeks   Status New   PT LONG TERM GOAL #5   Title Pt will ambulate 1,000 feet with equal step length, proper heel strike, and <3/10 pain to return pt to community ambulation.    Time 6   Period Weeks   Status New   PT LONG TERM GOAL #6   Title Pt will ascend/descend 12 steps with reciprocal pattern, one handrail, and <3/10 pain.    Time 6   Period Weeks   Status New               Plan - 02/09/15 1247    Clinical Impression Statement Pt presented with reports of pain in her L buttock region radiating down to leg. Pt continues to demonstrate muscle guarding, making SI joint mobility testing difficult at this time. Soft tissue mobilization and myofascial release were performed to L glut med, piriformis, and sacral sulcus, with reports of decreased pain following manual therapy. Continued with core strengthening in today's treatment, added prone heel squeeze and prone hip extension to increase glut activation. Pt denied any increased pain post treatment.    PT Next Visit Plan Reassess SIJ mobility if muscle guarding is decreased, continue with core and pelvic stabilization        Problem List Patient Active Problem List   Diagnosis Date Noted  . Right flank discomfort 06/04/2013  . Dyspepsia 04/23/2013  . Dry mouth 04/23/2013  . GERD (gastroesophageal reflux disease) 11/20/2012  . Dysphagia, unspecified(787.20) 11/20/2012  . Adenomatous polyp 11/20/2012    Hilma Favors, PT, DPT [phone] 02/09/2015, 12:51 PM  [place], [place], [address] Phone: [phone]   Fax:  [phone]  Name: Shannon Gallegos MRN: [id number] Date of  Birth: 06-19-1950

## 2015-02-11 ENCOUNTER — Ambulatory Visit (HOSPITAL_COMMUNITY): Payer: Self-pay

## 2015-02-11 DIAGNOSIS — M25562 Pain in left knee: Secondary | ICD-10-CM

## 2015-02-11 DIAGNOSIS — R262 Difficulty in walking, not elsewhere classified: Secondary | ICD-10-CM

## 2015-02-11 DIAGNOSIS — M25659 Stiffness of unspecified hip, not elsewhere classified: Secondary | ICD-10-CM

## 2015-02-11 DIAGNOSIS — M5441 Lumbago with sciatica, right side: Secondary | ICD-10-CM

## 2015-02-11 DIAGNOSIS — R6889 Other general symptoms and signs: Secondary | ICD-10-CM

## 2015-02-11 DIAGNOSIS — M25561 Pain in right knee: Secondary | ICD-10-CM

## 2015-02-11 NOTE — Therapy (Signed)
[place], [place], [address] Phone: [phone]   Fax:  [phone]  Physical Therapy Treatment  Patient Details  Name: Shannon Gallegos MRN: [id number] Date of Birth: 09/19/1950 Referring Provider: Aline Brochure  Encounter Date: 02/11/2015      PT End of Session - 02/11/15 0916    Visit Number 4   Number of Visits 18   Date for PT Re-Evaluation 03/04/15   Authorization Type CAFA   Authorization Time Period 02/02/15-04/04/15   PT Start Time 0846   PT Stop Time 0934   PT Time Calculation (min) 48 min   Activity Tolerance Patient tolerated treatment well   Behavior During Therapy [facility] for tasks assessed/performed      Past Medical History  Diagnosis Date  . Borderline diabetes   . Hypertension   . Hypercholesterolemia   . GERD (gastroesophageal reflux disease)     Past Surgical History  Procedure Laterality Date  . Knee surgery      both  . Tubal ligation    . Colonoscopy  March 2014    Dr. Ellender Hose: sigmoid and descending colon diverticula, ADENOMATOUS polyp. Due for surveillance 2019  . Esophagogastroduodenoscopy (egd) with esophageal dilation N/A 12/09/2012    Dr. Oneida Alar: small hiatal hernia, Schatzki's ring at GE junction s/p Savary dilation, benign sessile polyps, moderate gastritis    There were no vitals filed for this visit.  Visit Diagnosis:  Midline low back pain with right-sided sciatica  Bilateral knee pain  Difficulty walking  Hip stiffness, unspecified laterality  Decreased functional activity tolerance      Subjective Assessment - 02/11/15 0843    Subjective Pt stated she has intermitternt Lt buttock and LBP pain scale range from 3-10/10 today   Currently in Pain? Yes   Pain Score 3    Pain Location Back  LBP and Lt buttocks   Pain Orientation Lower            [facility] Adult PT Treatment/Exercise - 02/11/15 0001    Lumbar Exercises: Stretches   Active Hamstring Stretch 3  reps;30 seconds   Active Hamstring Stretch Limitations supine with rope   Lower Trunk Rotation Limitations 10 reps x 3 seconds bilat   Piriformis Stretch 3 reps;30 seconds   Piriformis Stretch Limitations seated    Lumbar Exercises: Supine   Ab Set 10 reps;3 seconds   Bent Knee Raise 10 reps   Bent Knee Raise Limitations with ab set   Bridge 10 reps   Bridge Limitations 2sets x 5 reps 1st set neutral and 2nd Rt foot closer following MET   Straight Leg Raise 10 reps   Straight Leg Raises Limitations Lt LE only following MET   Manual Therapy   Manual Therapy Muscle Energy Technique;Soft tissue mobilization;Myofascial release   Manual therapy comments Performed MET beginning of session and STM following therex   Soft tissue mobilization to L glut med, piriformis, sacral sulcus   Muscle Energy Technique MET for Rt anterior rotation f/b core strengthening exercises                PT Education - 02/11/15 0913    Education provided Yes   Education Details Educated on SI alignment and importance of core strengthening   Person(s) Educated Patient   Methods Explanation;Demonstration;Other (comment)  skeleton model   Comprehension Verbalized understanding;Returned demonstration;Verbal cues required;Need further instruction          PT Short Term Goals - 02/02/15 1746  PT SHORT TERM GOAL #1   Title Pt will be independent with HEP.   Time 3   Period Weeks   Status New   PT SHORT TERM GOAL #2   Title Improve lumbar flexion ROM to 65 degrees or greater to improve ability to bend forward.   Time 3   Period Weeks   Status New   PT SHORT TERM GOAL #3   Title Improve strength of BLE to 4-/5 or greater to improve functional mobility and gait mechanics.    Time 3   Period Weeks   Status New   PT SHORT TERM GOAL #4   Title Improve functional mobility as evidenced by five time sit to stand time of 50 seconds with UE support.    Time 3   Period Weeks   Status New   PT SHORT  TERM GOAL #5   Title Pt will ambulate 400 feet during 3 minute walk test to demonstrate improved gait speed.    Time 3   Period Weeks           PT Long Term Goals - 02/02/15 1749    PT LONG TERM GOAL #1   Title Pt will be independent with advanced HEP.   Time 6   Period Weeks   Status New   PT LONG TERM GOAL #2   Title Improve lumbar ROM to 75 degrees of flexion or greater with 1/0 pain to allow pt to bend forward to complete self care.    Time 6   Period Weeks   Status New   PT LONG TERM GOAL #3   Title Increase BLE strength to 4+/5 to improve gait mechanics and ability to climb stairs without pain.   Time 6   Period Weeks   Status New   PT LONG TERM GOAL #4   Title Improve functional mobility and BLE strength evidenced by five time sit to stand time of 30 seconds or less without UE support   Time 6   Period Weeks   Status New   PT LONG TERM GOAL #5   Title Pt will ambulate 1,000 feet with equal step length, proper heel strike, and <3/10 pain to return pt to community ambulation.    Time 6   Period Weeks   Status New   PT LONG TERM GOAL #6   Title Pt will ascend/descend 12 steps with reciprocal pattern, one handrail, and <3/10 pain.    Time 6   Period Weeks   Status New               Plan - 02/11/15 0916    Clinical Impression Statement Decreased muscle guarding this session with abiltiy to successfully complete muscle energy technique for Rt SI anterior rotation followed by core strengthening exercises.  Pt educated on SI alignment and importance of core strengthening to keep SI intact.  Verbal and tactile cueing for appropraite core musculature activation.  Added piriformis stretches to improve hip mobilty.  Ended session with manual soft tissue techiques to pirifromis, sacral sulcas and gluteal musculature to reduce tightness for pain control.  Pt reports pain reduced following manual and therex with no reports of radicular symptoms.     PT Next Visit Plan  Reassess SIJ mobility if muscle guarding is decreased, continue with core and pelvic stabilization        Problem List Patient Active Problem List   Diagnosis Date Noted  . Right flank discomfort 06/04/2013  . Dyspepsia 04/23/2013  .  Dry mouth 04/23/2013  . GERD (gastroesophageal reflux disease) 11/20/2012  . Dysphagia, unspecified(787.20) 11/20/2012  . Adenomatous polyp 11/20/2012   Ihor Austin, LPTA; [place] [facility]  Aldona Lento 02/11/2015, 7:08 PM  [facility] [address] [place], [place], [address] Phone: [phone]   Fax:  [phone]  Name: Shannon Gallegos MRN: [id number] Date of Birth: 09-Dec-1950

## 2015-02-14 ENCOUNTER — Ambulatory Visit (HOSPITAL_COMMUNITY): Payer: Self-pay | Admitting: Physical Therapy

## 2015-02-14 DIAGNOSIS — R262 Difficulty in walking, not elsewhere classified: Secondary | ICD-10-CM

## 2015-02-14 DIAGNOSIS — M25561 Pain in right knee: Secondary | ICD-10-CM

## 2015-02-14 DIAGNOSIS — M25659 Stiffness of unspecified hip, not elsewhere classified: Secondary | ICD-10-CM

## 2015-02-14 DIAGNOSIS — M25562 Pain in left knee: Secondary | ICD-10-CM

## 2015-02-14 DIAGNOSIS — R6889 Other general symptoms and signs: Secondary | ICD-10-CM

## 2015-02-14 DIAGNOSIS — M5441 Lumbago with sciatica, right side: Secondary | ICD-10-CM

## 2015-02-14 NOTE — Therapy (Signed)
[facility] [address] [place], [place], [address] Phone: [phone]   Fax:  [phone]  Physical Therapy Treatment  Patient Details  Name: Shannon Gallegos MRN: [id number] Date of Birth: 19-Mar-1951 Referring Provider: Aline Brochure  Encounter Date: 02/14/2015      PT End of Session - 02/14/15 1200    Visit Number 5   Number of Visits 18   Date for PT Re-Evaluation 03/04/15   Authorization Type CAFA   Authorization Time Period 02/02/15-04/04/15   PT Start Time 0800   PT Stop Time 0845   PT Time Calculation (min) 45 min   Activity Tolerance Patient tolerated treatment well   Behavior During Therapy [facility] for tasks assessed/performed      Past Medical History  Diagnosis Date  . Borderline diabetes   . Hypertension   . Hypercholesterolemia   . GERD (gastroesophageal reflux disease)     Past Surgical History  Procedure Laterality Date  . Knee surgery      both  . Tubal ligation    . Colonoscopy  March 2014    Dr. Ellender Hose: sigmoid and descending colon diverticula, ADENOMATOUS polyp. Due for surveillance 2019  . Esophagogastroduodenoscopy (egd) with esophageal dilation N/A 12/09/2012    Dr. Oneida Alar: small hiatal hernia, Schatzki's ring at GE junction s/p Savary dilation, benign sessile polyps, moderate gastritis    There were no vitals filed for this visit.  Visit Diagnosis:  Midline low back pain with right-sided sciatica  Bilateral knee pain  Difficulty walking  Hip stiffness, unspecified laterality  Decreased functional activity tolerance      Subjective Assessment - 02/14/15 0803    Subjective Pt reports that she still has pain when she stands up from the toilet that runs from her back down the side of her leg. She reports that it is still happening 2-3 times per day. She states that she is not feeling that great today, she woke up last night with a lot of pain. She reports that she feels like she has been making some  improvements.    Pain Score 5    Pain Location Back                [facility] Adult PT Treatment/Exercise - 02/14/15 0001    Lumbar Exercises: Stretches   Active Hamstring Stretch 3 reps;30 seconds   Active Hamstring Stretch Limitations supine with rope   Lower Trunk Rotation Limitations 10 reps x 3 seconds bilat   Lumbar Exercises: Supine   Ab Set 10 reps;3 seconds   Bent Knee Raise 15 reps   Bent Knee Raise Limitations with ab set   Bridge 10 reps   Bridge Limitations 2 sets, 1 set RLE closer following MET, 1 set neutral   Straight Leg Raise 10 reps   Straight Leg Raises Limitations Lt LE only following MET   Lumbar Exercises: Sidelying   Clam 10 reps   Manual Therapy   Manual therapy comments Performed MET beginning of session and STM following therex   Soft tissue mobilization to L glut med, piriformis, sacral sulcus   Muscle Energy Technique MET for Rt anterior rotation f/b core strengthening exercises                  PT Short Term Goals - 02/02/15 1746    PT SHORT TERM GOAL #1   Title Pt will be independent with HEP.   Time 3   Period Weeks   Status New  PT SHORT TERM GOAL #2   Title Improve lumbar flexion ROM to 65 degrees or greater to improve ability to bend forward.   Time 3   Period Weeks   Status New   PT SHORT TERM GOAL #3   Title Improve strength of BLE to 4-/5 or greater to improve functional mobility and gait mechanics.    Time 3   Period Weeks   Status New   PT SHORT TERM GOAL #4   Title Improve functional mobility as evidenced by five time sit to stand time of 50 seconds with UE support.    Time 3   Period Weeks   Status New   PT SHORT TERM GOAL #5   Title Pt will ambulate 400 feet during 3 minute walk test to demonstrate improved gait speed.    Time 3   Period Weeks           PT Long Term Goals - 02/02/15 1749    PT LONG TERM GOAL #1   Title Pt will be independent with advanced HEP.   Time 6   Period Weeks   Status New    PT LONG TERM GOAL #2   Title Improve lumbar ROM to 75 degrees of flexion or greater with 1/0 pain to allow pt to bend forward to complete self care.    Time 6   Period Weeks   Status New   PT LONG TERM GOAL #3   Title Increase BLE strength to 4+/5 to improve gait mechanics and ability to climb stairs without pain.   Time 6   Period Weeks   Status New   PT LONG TERM GOAL #4   Title Improve functional mobility and BLE strength evidenced by five time sit to stand time of 30 seconds or less without UE support   Time 6   Period Weeks   Status New   PT LONG TERM GOAL #5   Title Pt will ambulate 1,000 feet with equal step length, proper heel strike, and <3/10 pain to return pt to community ambulation.    Time 6   Period Weeks   Status New   PT LONG TERM GOAL #6   Title Pt will ascend/descend 12 steps with reciprocal pattern, one handrail, and <3/10 pain.    Time 6   Period Weeks   Status New               Plan - 02/14/15 1201    Clinical Impression Statement Treatment session began with MET to correct R SI anterior rotation, followed by core and pelvic stabilization exercisees. Pt required verbal and tactile cueing to complete sidelying clams with proper form and without rolling back. Session ended with soft tissue mobilization  to R piriformis, glut med, and sacral sulcus to decrease muscle guarding and muscle spasm. Pt reported decreased pain post treatment.    PT Next Visit Plan Continue with core stabilization, progress to standing if tolerated        Problem List Patient Active Problem List   Diagnosis Date Noted  . Right flank discomfort 06/04/2013  . Dyspepsia 04/23/2013  . Dry mouth 04/23/2013  . GERD (gastroesophageal reflux disease) 11/20/2012  . Dysphagia, unspecified(787.20) 11/20/2012  . Adenomatous polyp 11/20/2012    Hilma Favors, PT, DPT [phone] 02/14/2015, 12:09 PM  [place], [place], [address] Phone: [phone]   Fax:  [phone]  Name: Shannon Gallegos MRN: [id number] Date of  Birth: [birth year]

## 2015-02-16 ENCOUNTER — Ambulatory Visit (HOSPITAL_COMMUNITY): Payer: Self-pay

## 2015-02-16 ENCOUNTER — Telehealth (HOSPITAL_COMMUNITY): Payer: Self-pay

## 2015-02-16 NOTE — Telephone Encounter (Signed)
No show, called and spoke to pt. who stated she had rescheduled apt for today.  Pt aware of next apt date and time.    [address] [place]; [facility] [phone]

## 2015-02-21 ENCOUNTER — Ambulatory Visit (HOSPITAL_COMMUNITY): Payer: Self-pay | Admitting: Physical Therapy

## 2015-02-21 DIAGNOSIS — M25659 Stiffness of unspecified hip, not elsewhere classified: Secondary | ICD-10-CM

## 2015-02-21 DIAGNOSIS — M25562 Pain in left knee: Secondary | ICD-10-CM

## 2015-02-21 DIAGNOSIS — R262 Difficulty in walking, not elsewhere classified: Secondary | ICD-10-CM

## 2015-02-21 DIAGNOSIS — M5441 Lumbago with sciatica, right side: Secondary | ICD-10-CM

## 2015-02-21 DIAGNOSIS — M25561 Pain in right knee: Secondary | ICD-10-CM

## 2015-02-21 DIAGNOSIS — R6889 Other general symptoms and signs: Secondary | ICD-10-CM

## 2015-02-21 NOTE — Therapy (Signed)
[place], [place], [address] Phone: [phone]   Fax:  [phone]  Physical Therapy Treatment  Patient Details  Name: Shannon Gallegos MRN: [id number] Date of Birth: January 09, 1951 Referring Provider: Aline Brochure  Encounter Date: 02/21/2015      PT End of Session - 02/21/15 1348    Visit Number 6   Number of Visits 18   Date for PT Re-Evaluation 03/04/15   Authorization Type CAFA   Authorization Time Period 02/02/15-04/04/15   PT Start Time 0848   PT Stop Time 0934   PT Time Calculation (min) 46 min   Activity Tolerance Patient tolerated treatment well   Behavior During Therapy [facility] for tasks assessed/performed      Past Medical History  Diagnosis Date  . Borderline diabetes   . Hypertension   . Hypercholesterolemia   . GERD (gastroesophageal reflux disease)     Past Surgical History  Procedure Laterality Date  . Knee surgery      both  . Tubal ligation    . Colonoscopy  March 2014    Dr. Ellender Hose: sigmoid and descending colon diverticula, ADENOMATOUS polyp. Due for surveillance 2019  . Esophagogastroduodenoscopy (egd) with esophageal dilation N/A 12/09/2012    Dr. Oneida Alar: small hiatal hernia, Schatzki's ring at GE junction s/p Savary dilation, benign sessile polyps, moderate gastritis    There were no vitals filed for this visit.  Visit Diagnosis:  Midline low back pain with right-sided sciatica  Bilateral knee pain  Difficulty walking  Hip stiffness, unspecified laterality  Decreased functional activity tolerance      Subjective Assessment - 02/21/15 0858    Subjective Pt states she is still hurting and having radiating pain down her Lt glute and posterior LE.  Pt states she is returning back to MD and inquire on more testing as she feels there is more going on.  States the pain is almost unbearable with 10/10 shooting pain at times.  States in seated position she is not hurting as bad at 5/10.   States the pain suddenly comes on, especially when goes to get up or begins walking.    Currently in Pain? Yes   Pain Score 8    Pain Location Back   Pain Orientation Lower   Pain Radiating Towards Lt glute and posterior LE                         [facility] Adult PT Treatment/Exercise - 02/21/15 0857    Lumbar Exercises: Stretches   Active Hamstring Stretch 3 reps;30 seconds   Active Hamstring Stretch Limitations supine with rope   Lower Trunk Rotation Limitations 10 reps x 3 seconds bilat   Piriformis Stretch 3 reps;30 seconds   Piriformis Stretch Limitations seated    Lumbar Exercises: Supine   Bridge 10 reps   Bridge Limitations 2 sets, 1 set RLE closer following MET, 1 set neutral   Straight Leg Raise 10 reps   Lumbar Exercises: Sidelying   Clam 10 reps   Lumbar Exercises: Prone   Other Prone Lumbar Exercises Prone on elbows 2 mintues   Other Prone Lumbar Exercises press ups 5 reps   Manual Therapy   Manual Therapy Soft tissue mobilization   Soft tissue mobilization to L glut med, piriformis, sacral sulcus                  PT Short Term Goals - 02/02/15 1746  PT SHORT TERM GOAL #1   Title Pt will be independent with HEP.   Time 3   Period Weeks   Status New   PT SHORT TERM GOAL #2   Title Improve lumbar flexion ROM to 65 degrees or greater to improve ability to bend forward.   Time 3   Period Weeks   Status New   PT SHORT TERM GOAL #3   Title Improve strength of BLE to 4-/5 or greater to improve functional mobility and gait mechanics.    Time 3   Period Weeks   Status New   PT SHORT TERM GOAL #4   Title Improve functional mobility as evidenced by five time sit to stand time of 50 seconds with UE support.    Time 3   Period Weeks   Status New   PT SHORT TERM GOAL #5   Title Pt will ambulate 400 feet during 3 minute walk test to demonstrate improved gait speed.    Time 3   Period Weeks           PT Long Term Goals - 02/02/15 1749     PT LONG TERM GOAL #1   Title Pt will be independent with advanced HEP.   Time 6   Period Weeks   Status New   PT LONG TERM GOAL #2   Title Improve lumbar ROM to 75 degrees of flexion or greater with 1/0 pain to allow pt to bend forward to complete self care.    Time 6   Period Weeks   Status New   PT LONG TERM GOAL #3   Title Increase BLE strength to 4+/5 to improve gait mechanics and ability to climb stairs without pain.   Time 6   Period Weeks   Status New   PT LONG TERM GOAL #4   Title Improve functional mobility and BLE strength evidenced by five time sit to stand time of 30 seconds or less without UE support   Time 6   Period Weeks   Status New   PT LONG TERM GOAL #5   Title Pt will ambulate 1,000 feet with equal step length, proper heel strike, and <3/10 pain to return pt to community ambulation.    Time 6   Period Weeks   Status New   PT LONG TERM GOAL #6   Title Pt will ascend/descend 12 steps with reciprocal pattern, one handrail, and <3/10 pain.    Time 6   Period Weeks   Status New               Plan - 02/21/15 1342    Clinical Impression Statement Majority of treatment focused on education and answering patients questions and concerns.  Pt feels there is more to her back pain and intends on returning to MD.  Pt very focused on her pain, however able to complete all exericses without pain behaviors.  SI in good alighnment today, however with noted tightness in Lt glute when manual techniques completed.  Added prone exercises including prone of elbows and press ups.  PT reported overall feeling better at end of session today . Pt advised to contact MD if worried about persistent pain, however feel after a couple more weeks of continued exercises her symptoms will improve.    PT Next Visit Plan Continue with core stabilization, progress to standing if tolerated        Problem List Patient Active Problem List   Diagnosis Date Noted  .  Right flank  discomfort 06/04/2013  . Dyspepsia 04/23/2013  . Dry mouth 04/23/2013  . GERD (gastroesophageal reflux disease) 11/20/2012  . Dysphagia, unspecified(787.20) 11/20/2012  . Adenomatous polyp 11/20/2012   Teena Irani, PTA/CLT [phone]  02/21/2015, 1:49 PM  [facility] [address] [place], [place], [address] Phone: [phone]   Fax:  [phone]  Name: Shannon Gallegos MRN: [id number] Date of Birth: 1950-11-20

## 2015-02-23 ENCOUNTER — Ambulatory Visit (HOSPITAL_COMMUNITY): Payer: Self-pay | Admitting: Physical Therapy

## 2015-02-23 DIAGNOSIS — M25561 Pain in right knee: Secondary | ICD-10-CM

## 2015-02-23 DIAGNOSIS — M5441 Lumbago with sciatica, right side: Secondary | ICD-10-CM

## 2015-02-23 DIAGNOSIS — M25562 Pain in left knee: Secondary | ICD-10-CM

## 2015-02-23 DIAGNOSIS — R262 Difficulty in walking, not elsewhere classified: Secondary | ICD-10-CM

## 2015-02-23 DIAGNOSIS — M25659 Stiffness of unspecified hip, not elsewhere classified: Secondary | ICD-10-CM

## 2015-02-23 NOTE — Therapy (Signed)
[facility] [address] [place], [place], [address] Phone: [phone]   Fax:  [phone]  Physical Therapy Treatment  Patient Details  Name: Shannon Gallegos MRN: [id number] Date of Birth: Dec 07, 1950 Referring Provider: Aline Brochure  Encounter Date: 02/23/2015      PT End of Session - 02/23/15 1020    Visit Number 7   Number of Visits 18   Date for PT Re-Evaluation 03/04/15   Authorization Type CAFA   Authorization Time Period 02/02/15-04/04/15   PT Start Time 0940   PT Stop Time 1020   PT Time Calculation (min) 40 min   Activity Tolerance Patient tolerated treatment well   Behavior During Therapy [facility] for tasks assessed/performed      Past Medical History  Diagnosis Date  . Borderline diabetes   . Hypertension   . Hypercholesterolemia   . GERD (gastroesophageal reflux disease)     Past Surgical History  Procedure Laterality Date  . Knee surgery      both  . Tubal ligation    . Colonoscopy  March 2014    Dr. Ellender Hose: sigmoid and descending colon diverticula, ADENOMATOUS polyp. Due for surveillance 2019  . Esophagogastroduodenoscopy (egd) with esophageal dilation N/A 12/09/2012    Dr. Oneida Alar: small hiatal hernia, Schatzki's ring at GE junction s/p Savary dilation, benign sessile polyps, moderate gastritis    There were no vitals filed for this visit.  Visit Diagnosis:  Midline low back pain with right-sided sciatica  Bilateral knee pain  Difficulty walking  Hip stiffness, unspecified laterality      Subjective Assessment - 02/23/15 1045    Subjective Pt reports she feels the best today yet.  Pt very pleased with her reduction in pain.  Currently only with 2/10 and describes as slight discomfort.    Currently in Pain? Yes   Pain Score 2    Pain Location Back                         [facility] Adult PT Treatment/Exercise - 02/23/15 0945    Lumbar Exercises: Stretches   Active Hamstring Stretch 3 reps;30  seconds   Active Hamstring Stretch Limitations supine with rope   Lower Trunk Rotation Limitations 10 reps x 3 seconds bilat   Lumbar Exercises: Supine   Bridge 15 reps   Bridge Limitations 2 sets   Straight Leg Raise 15 reps   Straight Leg Raises Limitations 2 sets   Lumbar Exercises: Sidelying   Clam 15 reps   Hip Abduction 15 reps   Lumbar Exercises: Prone   Other Prone Lumbar Exercises Prone on elbows 2 mintues   Other Prone Lumbar Exercises press ups 5 reps with 3" holds                  PT Short Term Goals - 02/02/15 1746    PT SHORT TERM GOAL #1   Title Pt will be independent with HEP.   Time 3   Period Weeks   Status New   PT SHORT TERM GOAL #2   Title Improve lumbar flexion ROM to 65 degrees or greater to improve ability to bend forward.   Time 3   Period Weeks   Status New   PT SHORT TERM GOAL #3   Title Improve strength of BLE to 4-/5 or greater to improve functional mobility and gait mechanics.    Time 3   Period Weeks   Status New  PT SHORT TERM GOAL #4   Title Improve functional mobility as evidenced by five time sit to stand time of 50 seconds with UE support.    Time 3   Period Weeks   Status New   PT SHORT TERM GOAL #5   Title Pt will ambulate 400 feet during 3 minute walk test to demonstrate improved gait speed.    Time 3   Period Weeks           PT Long Term Goals - 02/02/15 1749    PT LONG TERM GOAL #1   Title Pt will be independent with advanced HEP.   Time 6   Period Weeks   Status New   PT LONG TERM GOAL #2   Title Improve lumbar ROM to 75 degrees of flexion or greater with 1/0 pain to allow pt to bend forward to complete self care.    Time 6   Period Weeks   Status New   PT LONG TERM GOAL #3   Title Increase BLE strength to 4+/5 to improve gait mechanics and ability to climb stairs without pain.   Time 6   Period Weeks   Status New   PT LONG TERM GOAL #4   Title Improve functional mobility and BLE strength evidenced  by five time sit to stand time of 30 seconds or less without UE support   Time 6   Period Weeks   Status New   PT LONG TERM GOAL #5   Title Pt will ambulate 1,000 feet with equal step length, proper heel strike, and <3/10 pain to return pt to community ambulation.    Time 6   Period Weeks   Status New   PT LONG TERM GOAL #6   Title Pt will ascend/descend 12 steps with reciprocal pattern, one handrail, and <3/10 pain.    Time 6   Period Weeks   Status New               Plan - 02/23/15 1044    Clinical Impression Statement Conitnued with focus on improving lumbar strength and stability.  Progressed reps of therex today without diffiuclty.  Less verbal cues needed today wtih therex and patient overall in better spirits today due to reduced pain.  No pain at end of session.    PT Next Visit Plan Continue with core stabilization, progress to standing if tolerated        Problem List Patient Active Problem List   Diagnosis Date Noted  . Right flank discomfort 06/04/2013  . Dyspepsia 04/23/2013  . Dry mouth 04/23/2013  . GERD (gastroesophageal reflux disease) 11/20/2012  . Dysphagia, unspecified(787.20) 11/20/2012  . Adenomatous polyp 11/20/2012    Teena Irani, PTA/CLT [phone] 02/23/2015, 10:46 AM  [facility] [address] [place], [place], [address] Phone: [phone]   Fax:  [phone]  Name: LINDZI WEISENBACH MRN: [id number] Date of Birth: 11-29-1950

## 2015-02-25 ENCOUNTER — Ambulatory Visit (HOSPITAL_COMMUNITY): Payer: Self-pay | Attending: Orthopedic Surgery | Admitting: Physical Therapy

## 2015-02-25 DIAGNOSIS — R262 Difficulty in walking, not elsewhere classified: Secondary | ICD-10-CM | POA: Insufficient documentation

## 2015-02-25 DIAGNOSIS — M25561 Pain in right knee: Secondary | ICD-10-CM | POA: Insufficient documentation

## 2015-02-25 DIAGNOSIS — R6889 Other general symptoms and signs: Secondary | ICD-10-CM | POA: Insufficient documentation

## 2015-02-25 DIAGNOSIS — M25659 Stiffness of unspecified hip, not elsewhere classified: Secondary | ICD-10-CM | POA: Insufficient documentation

## 2015-02-25 DIAGNOSIS — M25562 Pain in left knee: Secondary | ICD-10-CM | POA: Insufficient documentation

## 2015-02-25 DIAGNOSIS — M5441 Lumbago with sciatica, right side: Secondary | ICD-10-CM | POA: Insufficient documentation

## 2015-02-25 NOTE — Therapy (Signed)
[facility] [address] [place], [place], [address] Phone: [phone]   Fax:  [phone]  Physical Therapy Treatment  Patient Details  Name: Shannon Gallegos MRN: [id number] Date of Birth: Aug 28, 1950 Referring Provider: Aline Brochure  Encounter Date: 02/25/2015      PT End of Session - 02/25/15 0940    Visit Number 8   Number of Visits 18   Date for PT Re-Evaluation 03/04/15   Authorization Type CAFA   Authorization Time Period 02/02/15-04/04/15   PT Start Time 0847   PT Stop Time 0930   PT Time Calculation (min) 43 min   Activity Tolerance Patient limited by pain   Behavior During Therapy [facility] for tasks assessed/performed      Past Medical History  Diagnosis Date  . Borderline diabetes   . Hypertension   . Hypercholesterolemia   . GERD (gastroesophageal reflux disease)     Past Surgical History  Procedure Laterality Date  . Knee surgery      both  . Tubal ligation    . Colonoscopy  March 2014    Dr. Ellender Hose: sigmoid and descending colon diverticula, ADENOMATOUS polyp. Due for surveillance 2019  . Esophagogastroduodenoscopy (egd) with esophageal dilation N/A 12/09/2012    Dr. Oneida Alar: small hiatal hernia, Schatzki's ring at GE junction s/p Savary dilation, benign sessile polyps, moderate gastritis    There were no vitals filed for this visit.  Visit Diagnosis:  Midline low back pain with right-sided sciatica  Bilateral knee pain  Difficulty walking  Hip stiffness, unspecified laterality      Subjective Assessment - 02/25/15 0850    Subjective Pt reports that she is having a lot of pain today. She says that she is hurting all over today. She reports that her back has been feeling better, but the past few days have been pretty bad, and she is achey and sore today.    Currently in Pain? Yes   Pain Location Leg  legs mainly, but having pain all over   Pain Orientation Right;Left                          [facility] Adult PT Treatment/Exercise - 02/25/15 0001    Lumbar Exercises: Stretches   Active Hamstring Stretch 3 reps;30 seconds   Active Hamstring Stretch Limitations supine with rope   Lower Trunk Rotation Limitations 10 reps x 3 seconds bilat   Lumbar Exercises: Supine   Bridge 15 reps   Bridge Limitations 2 sets   Straight Leg Raise 10 reps   Straight Leg Raises Limitations 2 sets   Lumbar Exercises: Sidelying   Clam 15 reps   Hip Abduction 10 reps                  PT Short Term Goals - 02/02/15 1746    PT SHORT TERM GOAL #1   Title Pt will be independent with HEP.   Time 3   Period Weeks   Status New   PT SHORT TERM GOAL #2   Title Improve lumbar flexion ROM to 65 degrees or greater to improve ability to bend forward.   Time 3   Period Weeks   Status New   PT SHORT TERM GOAL #3   Title Improve strength of BLE to 4-/5 or greater to improve functional mobility and gait mechanics.    Time 3   Period Weeks   Status New   PT SHORT TERM GOAL #  4   Title Improve functional mobility as evidenced by five time sit to stand time of 50 seconds with UE support.    Time 3   Period Weeks   Status New   PT SHORT TERM GOAL #5   Title Pt will ambulate 400 feet during 3 minute walk test to demonstrate improved gait speed.    Time 3   Period Weeks           PT Long Term Goals - 02/02/15 1749    PT LONG TERM GOAL #1   Title Pt will be independent with advanced HEP.   Time 6   Period Weeks   Status New   PT LONG TERM GOAL #2   Title Improve lumbar ROM to 75 degrees of flexion or greater with 1/0 pain to allow pt to bend forward to complete self care.    Time 6   Period Weeks   Status New   PT LONG TERM GOAL #3   Title Increase BLE strength to 4+/5 to improve gait mechanics and ability to climb stairs without pain.   Time 6   Period Weeks   Status New   PT LONG TERM GOAL #4   Title Improve functional mobility and BLE strength evidenced by five time sit to stand  time of 30 seconds or less without UE support   Time 6   Period Weeks   Status New   PT LONG TERM GOAL #5   Title Pt will ambulate 1,000 feet with equal step length, proper heel strike, and <3/10 pain to return pt to community ambulation.    Time 6   Period Weeks   Status New   PT LONG TERM GOAL #6   Title Pt will ascend/descend 12 steps with reciprocal pattern, one handrail, and <3/10 pain.    Time 6   Period Weeks   Status New               Plan - 02/25/15 0941    Clinical Impression Statement Continued with functional stretching and core strengthening in today's treatment. Pt required increased time to complete therex today due to reports of achiness in knees. Pt became tearful at the end of the session, stating that she knew that her back was getting stronger, but she was frustrated that she is still so limited.    PT Next Visit Plan Continue with core strengthening and manual therapy, progress strengthening to standing if tolerated        Problem List Patient Active Problem List   Diagnosis Date Noted  . Right flank discomfort 06/04/2013  . Dyspepsia 04/23/2013  . Dry mouth 04/23/2013  . GERD (gastroesophageal reflux disease) 11/20/2012  . Dysphagia, unspecified(787.20) 11/20/2012  . Adenomatous polyp 11/20/2012    Hilma Favors, PT, DPT [phone] 02/25/2015, 9:43 AM  [place], [place], [address] Phone: [phone]   Fax:  [phone]  Name: SCOTTLYNN NUSSER MRN: [id number] Date of Birth: 1950/10/06

## 2015-02-28 ENCOUNTER — Ambulatory Visit (HOSPITAL_COMMUNITY): Payer: Self-pay | Admitting: Physical Therapy

## 2015-02-28 DIAGNOSIS — M25562 Pain in left knee: Secondary | ICD-10-CM

## 2015-02-28 DIAGNOSIS — R262 Difficulty in walking, not elsewhere classified: Secondary | ICD-10-CM

## 2015-02-28 DIAGNOSIS — M25659 Stiffness of unspecified hip, not elsewhere classified: Secondary | ICD-10-CM

## 2015-02-28 DIAGNOSIS — M25561 Pain in right knee: Secondary | ICD-10-CM

## 2015-02-28 DIAGNOSIS — M5441 Lumbago with sciatica, right side: Secondary | ICD-10-CM

## 2015-02-28 NOTE — Therapy (Signed)
[facility] [place], [place], [address] Phone: [phone]   Fax:  [phone]  Physical Therapy Treatment  Patient Details  Name: Shannon Gallegos MRN: [id number] Date of Birth: 1950-04-01 Referring Provider: Aline Brochure  Encounter Date: 02/28/2015      PT End of Session - 02/28/15 1156    Visit Number 9   Number of Visits 18   Date for PT Re-Evaluation 03/04/15   Authorization Type CAFA   Authorization Time Period 02/02/15-04/04/15   PT Start Time 0933   PT Stop Time 1015   PT Time Calculation (min) 42 min   Activity Tolerance Patient tolerated treatment well   Behavior During Therapy [facility] for tasks assessed/performed      Past Medical History  Diagnosis Date  . Borderline diabetes   . Hypertension   . Hypercholesterolemia   . GERD (gastroesophageal reflux disease)     Past Surgical History  Procedure Laterality Date  . Knee surgery      both  . Tubal ligation    . Colonoscopy  March 2014    Dr. Ellender Hose: sigmoid and descending colon diverticula, ADENOMATOUS polyp. Due for surveillance 2019  . Esophagogastroduodenoscopy (egd) with esophageal dilation N/A 12/09/2012    Dr. Oneida Alar: small hiatal hernia, Schatzki's ring at GE junction s/p Savary dilation, benign sessile polyps, moderate gastritis    There were no vitals filed for this visit.  Visit Diagnosis:  Midline low back pain with right-sided sciatica  Bilateral knee pain  Difficulty walking  Hip stiffness, unspecified laterality      Subjective Assessment - 02/28/15 0938    Subjective Pt reports that she tried to take it easy over the weekend. She went to church yesterday and had a hard time sitting through the entire service.   Currently in Pain? Yes   Pain Score 6    Pain Location Back   Pain Orientation Right                         [facility] Adult PT Treatment/Exercise - 02/28/15 0001    Lumbar Exercises: Stretches   Active  Hamstring Stretch 3 reps;30 seconds   Active Hamstring Stretch Limitations supine with rope   Lower Trunk Rotation Limitations 10 reps x 3 seconds bilat   Lumbar Exercises: Standing   Forward Lunge 10 reps   Forward Lunge Limitations 6" step   Lumbar Exercises: Supine   Ab Set 10 reps;3 seconds   Bridge 15 reps   Bridge Limitations 2 sets   Straight Leg Raise 10 reps   Lumbar Exercises: Sidelying   Clam 15 reps   Clam Limitations with RTB                  PT Short Term Goals - 02/02/15 1746    PT SHORT TERM GOAL #1   Title Pt will be independent with HEP.   Time 3   Period Weeks   Status New   PT SHORT TERM GOAL #2   Title Improve lumbar flexion ROM to 65 degrees or greater to improve ability to bend forward.   Time 3   Period Weeks   Status New   PT SHORT TERM GOAL #3   Title Improve strength of BLE to 4-/5 or greater to improve functional mobility and gait mechanics.    Time 3   Period Weeks   Status New   PT SHORT TERM GOAL #4   Title  Improve functional mobility as evidenced by five time sit to stand time of 50 seconds with UE support.    Time 3   Period Weeks   Status New   PT SHORT TERM GOAL #5   Title Pt will ambulate 400 feet during 3 minute walk test to demonstrate improved gait speed.    Time 3   Period Weeks           PT Long Term Goals - 02/02/15 1749    PT LONG TERM GOAL #1   Title Pt will be independent with advanced HEP.   Time 6   Period Weeks   Status New   PT LONG TERM GOAL #2   Title Improve lumbar ROM to 75 degrees of flexion or greater with 1/0 pain to allow pt to bend forward to complete self care.    Time 6   Period Weeks   Status New   PT LONG TERM GOAL #3   Title Increase BLE strength to 4+/5 to improve gait mechanics and ability to climb stairs without pain.   Time 6   Period Weeks   Status New   PT LONG TERM GOAL #4   Title Improve functional mobility and BLE strength evidenced by five time sit to stand time of 30  seconds or less without UE support   Time 6   Period Weeks   Status New   PT LONG TERM GOAL #5   Title Pt will ambulate 1,000 feet with equal step length, proper heel strike, and <3/10 pain to return pt to community ambulation.    Time 6   Period Weeks   Status New   PT LONG TERM GOAL #6   Title Pt will ascend/descend 12 steps with reciprocal pattern, one handrail, and <3/10 pain.    Time 6   Period Weeks   Status New               Plan - 02/28/15 1156    Clinical Impression Statement Pt demonstrated improved tolerance to treatment in today's session. Standing lunges and sidestepping were added to improve quad and hip abductor strength, pt required verbal and visual cueing for proper form during the exercises but was able to complete them without c/o increased pain. Pt did report increased muscle fatigue and soreness post treatment. Pt will benefit from continued core strengthening and hip strengthening to decrease LBP and knee pain.    PT Next Visit Plan Continue with core strengthening, manual to R QL PRN        Problem List Patient Active Problem List   Diagnosis Date Noted  . Right flank discomfort 06/04/2013  . Dyspepsia 04/23/2013  . Dry mouth 04/23/2013  . GERD (gastroesophageal reflux disease) 11/20/2012  . Dysphagia, unspecified(787.20) 11/20/2012  . Adenomatous polyp 11/20/2012    Hilma Favors, PT, DPT [phone] 02/28/2015, 12:03 PM  [place], [place], [address] Phone: [phone]   Fax:  [phone]  Name: Shannon Gallegos MRN: [id number] Date of Birth: 1950/10/30

## 2015-03-02 ENCOUNTER — Ambulatory Visit (HOSPITAL_COMMUNITY): Payer: Self-pay | Admitting: Physical Therapy

## 2015-03-02 DIAGNOSIS — R262 Difficulty in walking, not elsewhere classified: Secondary | ICD-10-CM

## 2015-03-02 DIAGNOSIS — M25561 Pain in right knee: Secondary | ICD-10-CM

## 2015-03-02 DIAGNOSIS — M25659 Stiffness of unspecified hip, not elsewhere classified: Secondary | ICD-10-CM

## 2015-03-02 DIAGNOSIS — R6889 Other general symptoms and signs: Secondary | ICD-10-CM

## 2015-03-02 DIAGNOSIS — M25562 Pain in left knee: Secondary | ICD-10-CM

## 2015-03-02 DIAGNOSIS — M5441 Lumbago with sciatica, right side: Secondary | ICD-10-CM

## 2015-03-02 NOTE — Therapy (Signed)
[facility] [address] [place], [place], [address] Phone: [phone]   Fax:  [phone]  Physical Therapy Treatment  Patient Details  Name: Shannon Gallegos MRN: [id number] Date of Birth: 08-May-1950 Referring Provider: Aline Brochure  Encounter Date: 03/02/2015      PT End of Session - 03/02/15 1316    Visit Number 10   Number of Visits 18   Date for PT Re-Evaluation 03/04/15   Authorization Type CAFA   Authorization Time Period 02/02/15-04/04/15   PT Start Time 0931   PT Stop Time 1014   PT Time Calculation (min) 43 min   Activity Tolerance Patient tolerated treatment well   Behavior During Therapy [facility] for tasks assessed/performed      Past Medical History  Diagnosis Date  . Borderline diabetes   . Hypertension   . Hypercholesterolemia   . GERD (gastroesophageal reflux disease)     Past Surgical History  Procedure Laterality Date  . Knee surgery      both  . Tubal ligation    . Colonoscopy  March 2014    Dr. Ellender Hose: sigmoid and descending colon diverticula, ADENOMATOUS polyp. Due for surveillance 2019  . Esophagogastroduodenoscopy (egd) with esophageal dilation N/A 12/09/2012    Dr. Oneida Alar: small hiatal hernia, Schatzki's ring at GE junction s/p Savary dilation, benign sessile polyps, moderate gastritis    There were no vitals filed for this visit.  Visit Diagnosis:  Midline low back pain with right-sided sciatica  Bilateral knee pain  Difficulty walking  Hip stiffness, unspecified laterality  Decreased functional activity tolerance      Subjective Assessment - 03/02/15 0933    Subjective Patient reports that the past two days have been ok, feels like she might have stretched a little too much and now she is having some soreness in her knees. She was up at 4am with pain today.    How long can you sit comfortably? 12/7- 20-30 minutes still    How long can you stand comfortably? 12/7- 15 minutes    How long can you  walk comfortably? 12/7- <15 minutes still    Patient Stated Goals Return to walking long distances, decrease pain to be able to stand and cook meal   Currently in Pain? Yes   Pain Score 6    Pain Location Back            [facility] PT Assessment - 03/02/15 0001    Observation/Other Assessments   Focus on Therapeutic Outcomes (FOTO)  57% limited    Transfers   Five time sit to stand comments  unable to complete secondary to pain; 2 sit to stands in 35.4 seconds    6 minute walk test results    Aerobic Endurance Distance Walked 526   Endurance additional comments 3MWT                      [facility] Adult PT Treatment/Exercise - 03/02/15 0001    Lumbar Exercises: Stretches   Active Hamstring Stretch 3 reps;30 seconds   Active Hamstring Stretch Limitations supine with rope   Passive Hamstring Stretch 3 reps;30 seconds   Passive Hamstring Stretch Limitations gastroc on slantboard    Lower Trunk Rotation Limitations 10 reps   Piriformis Stretch 3 reps;30 seconds   Piriformis Stretch Limitations seated                 PT Education - 03/02/15 1314    Education provided Yes  Education Details encouraged patient to potentially speak to MD again regarding knees and back, re-assess will be continued next session due to time constraints    Person(s) Educated Patient   Methods Explanation   Comprehension Verbalized understanding          PT Short Term Goals - 03/02/15 1009    PT SHORT TERM GOAL #1   Title Pt will be independent with HEP.   Baseline 12/7- reports she is trying to do it every day    Time 3   Period Weeks   Status Achieved   PT SHORT TERM GOAL #2   Title Improve lumbar flexion ROM to 65 degrees or greater to improve ability to bend forward.   Time 3   Period Weeks   Status On-going   PT SHORT TERM GOAL #3   Title Improve strength of BLE to 4-/5 or greater to improve functional mobility and gait mechanics.    Time 3   Period Weeks   Status  On-going   PT SHORT TERM GOAL #4   Title Improve functional mobility as evidenced by five time sit to stand time of 50 seconds with UE support.    Baseline 12/7- unable to complete due to pain    Time 3   Period Weeks   Status On-going   PT SHORT TERM GOAL #5   Title Pt will ambulate 400 feet during 3 minute walk test to demonstrate improved gait speed.    Time 3   Period Weeks   Status Achieved           PT Long Term Goals - 03/02/15 1011    PT LONG TERM GOAL #1   Title Pt will be independent with advanced HEP.   Time 6   Period Weeks   Status On-going   PT LONG TERM GOAL #2   Title Improve lumbar ROM to 75 degrees of flexion or greater with 1/0 pain to allow pt to bend forward to complete self care.    PT LONG TERM GOAL #3   Title Increase BLE strength to 4+/5 to improve gait mechanics and ability to climb stairs without pain.   PT LONG TERM GOAL #4   Title Improve functional mobility and BLE strength evidenced by five time sit to stand time of 30 seconds or less without UE support   Time 6   Period Weeks   Status On-going   PT LONG TERM GOAL #5   Title Pt will ambulate 1,000 feet with equal step length, proper heel strike, and <3/10 pain to return pt to community ambulation.    Time 6   Period Weeks   Status On-going   PT LONG TERM GOAL #6   Title Pt will ascend/descend 12 steps with reciprocal pattern, one handrail, and <3/10 pain.    Time 6   Period Weeks   Status On-going               Plan - 03/02/15 1318    Clinical Impression Statement Attempted to perform re-assessment today, however after performing warmup exercises today patient became very talkative and PT had to attempt at multiple times to redirect the session in order to be able to complete re-assessment. However patient did remain extermetly talkative and became very emotional midsession, preventing PT from completing re-assessment today. Assessment will be finished next session and both notes  will be routed to MD.    Pt will benefit from skilled therapeutic intervention in order to improve  on the following deficits Decreased activity tolerance;Decreased endurance;Decreased mobility;Decreased range of motion;Decreased strength;Difficulty walking;Hypomobility;Pain   Rehab Potential Fair   Clinical Impairments Affecting Rehab Potential Fair prognosis due to chronicity of sx   PT Frequency 3x / week   PT Duration 6 weeks   PT Treatment/Interventions ADLs/Self Care Home Management;Cryotherapy;Electrical Stimulation;Moist Heat;Gait training;Stair training;Functional mobility training;Therapeutic activities;Therapeutic exercise;Balance training;Patient/family education;Manual techniques;Passive range of motion   PT Next Visit Plan Finish re-assessment, route today's and 12/7's note to MD    Consulted and Agree with Plan of Care Patient        Problem List Patient Active Problem List   Diagnosis Date Noted  . Right flank discomfort 06/04/2013  . Dyspepsia 04/23/2013  . Dry mouth 04/23/2013  . GERD (gastroesophageal reflux disease) 11/20/2012  . Dysphagia, unspecified(787.20) 11/20/2012  . Adenomatous polyp 11/20/2012    Deniece Ree PT, DPT [facility] [address] [place], [place], [address] Phone: [phone]   Fax:  [phone]  Name: Shannon Gallegos MRN: [id number] Date of Birth: June 02, 1950

## 2015-03-04 ENCOUNTER — Ambulatory Visit (HOSPITAL_COMMUNITY): Payer: Self-pay | Admitting: Physical Therapy

## 2015-03-04 DIAGNOSIS — M5441 Lumbago with sciatica, right side: Secondary | ICD-10-CM

## 2015-03-04 DIAGNOSIS — M25562 Pain in left knee: Secondary | ICD-10-CM

## 2015-03-04 DIAGNOSIS — M25659 Stiffness of unspecified hip, not elsewhere classified: Secondary | ICD-10-CM

## 2015-03-04 DIAGNOSIS — R6889 Other general symptoms and signs: Secondary | ICD-10-CM

## 2015-03-04 DIAGNOSIS — M25561 Pain in right knee: Secondary | ICD-10-CM

## 2015-03-04 DIAGNOSIS — R262 Difficulty in walking, not elsewhere classified: Secondary | ICD-10-CM

## 2015-03-04 NOTE — Therapy (Signed)
[facility] [address] [place], [place], [address] Phone: [phone]   Fax:  [phone]  Physical Therapy Treatment  Patient Details  Name: Shannon Gallegos MRN: [id number] Date of Birth: 1950-06-07 Referring Provider: Aline Brochure  Encounter Date: 03/04/2015      PT End of Session - 03/04/15 1047    Visit Number 11   Number of Visits 18   Date for PT Re-Evaluation 03/04/15   Authorization Type CAFA   Authorization Time Period 02/02/15-04/04/15   PT Start Time 0932   PT Stop Time 1028   PT Time Calculation (min) 56 min   Activity Tolerance Patient limited by pain   Behavior During Therapy [facility] for tasks assessed/performed      Past Medical History  Diagnosis Date  . Borderline diabetes   . Hypertension   . Hypercholesterolemia   . GERD (gastroesophageal reflux disease)     Past Surgical History  Procedure Laterality Date  . Knee surgery      both  . Tubal ligation    . Colonoscopy  March 2014    Dr. Ellender Hose: sigmoid and descending colon diverticula, ADENOMATOUS polyp. Due for surveillance 2019  . Esophagogastroduodenoscopy (egd) with esophageal dilation N/A 12/09/2012    Dr. Oneida Alar: small hiatal hernia, Schatzki's ring at GE junction s/p Savary dilation, benign sessile polyps, moderate gastritis    There were no vitals filed for this visit.  Visit Diagnosis:  Midline low back pain with right-sided sciatica  Bilateral knee pain  Difficulty walking  Hip stiffness, unspecified laterality  Decreased functional activity tolerance      Subjective Assessment - 03/04/15 0938    Subjective Pt reports that her L buttock is really hurting today, she rates that pain as a 7/10. She has been having to take a lot pain medicine since her last treatment.    Currently in Pain? Yes   Pain Score 7    Pain Location Buttocks   Pain Orientation Left            [facility] PT Assessment - 03/04/15 0001    AROM   Right Hip External  Rotation  14  limited by knee pain   Right Hip Internal Rotation  22  limited by knee pain   Left Hip External Rotation  25  limited by knee pain   Left Hip Internal Rotation  24  limited by knee pain   Lumbar Flexion 67   Lumbar Extension 16   Lumbar - Right Side Bend 39   Lumbar - Left Side Bend 30   Strength   Right Hip Flexion 3/5  was 3-   Right Hip Extension 2+/5  was 2+   Right Hip ABduction 3-/5  was 3-   Left Hip Flexion 3+/5  was 3+   Left Hip Extension 2+/5  was 3-   Left Hip ABduction 3+/5  was 3   Right Knee Flexion 3+/5  was 3+   Right Knee Extension 3-/5  was 3-   Left Knee Flexion 3+/5  was 3+   Left Knee Extension 3-/5  was 3-                     [facility] Adult PT Treatment/Exercise - 03/04/15 0001    Lumbar Exercises: Stretches   Active Hamstring Stretch 3 reps;30 seconds   Active Hamstring Stretch Limitations supine with rope   Manual Therapy   Manual Therapy Soft tissue mobilization;Muscle Energy Technique   Manual  therapy comments performed following reassessment and stretching   Soft tissue mobilization to bilateral paraspinals, quadratus lumborum   Muscle Energy Technique MET for Rt anterior rotation                 PT Education - 03/04/15 1046    Education provided Yes   Education Details educated pt on returning to MD to discuss symptoms, discussed results of strength and ROM measurements   Person(s) Educated Patient   Methods Explanation   Comprehension Verbalized understanding          PT Short Term Goals - 03/04/15 1058    PT SHORT TERM GOAL #1   Title Pt will be independent with HEP.   Baseline 12/7- reports she is trying to do it every day    Time 3   Period Weeks   Status Achieved   PT SHORT TERM GOAL #2   Title Improve lumbar flexion ROM to 65 degrees or greater to improve ability to bend forward.   Time 3   Period Weeks   Status Achieved   PT SHORT TERM GOAL #3   Title Improve strength of BLE to  4-/5 or greater to improve functional mobility and gait mechanics.    Time 3   Period Weeks   Status On-going   PT SHORT TERM GOAL #4   Title Improve functional mobility as evidenced by five time sit to stand time of 50 seconds with UE support.    Baseline 12/9- unable to complete due to pain    Time 3   Period Weeks   Status On-going   PT SHORT TERM GOAL #5   Title Pt will ambulate 400 feet during 3 minute walk test to demonstrate improved gait speed.    Time 3   Period Weeks   Status Achieved           PT Long Term Goals - 03/04/15 1059    PT LONG TERM GOAL #1   Title Pt will be independent with advanced HEP.   Time 6   Period Weeks   Status On-going   PT LONG TERM GOAL #2   Title Improve lumbar ROM to 75 degrees of flexion or greater with 1/0 pain to allow pt to bend forward to complete self care.    Time 6   Period Weeks   Status On-going   PT LONG TERM GOAL #3   Title Increase BLE strength to 4+/5 to improve gait mechanics and ability to climb stairs without pain.   Time 6   Period Weeks   Status On-going   PT LONG TERM GOAL #4   Title Improve functional mobility and BLE strength evidenced by five time sit to stand time of 30 seconds or less without UE support   Time 6   Period Weeks   Status On-going   PT LONG TERM GOAL #5   Title Pt will ambulate 1,000 feet with equal step length, proper heel strike, and <3/10 pain to return pt to community ambulation.    Time 6   Period Weeks   Status On-going   PT LONG TERM GOAL #6   Title Pt will ascend/descend 12 steps with reciprocal pattern, one handrail, and <3/10 pain.    Time 6   Period Weeks   Status On-going               Plan - 03/04/15 1048    Clinical Impression Statement Reassessment was completed today, with strength and ROM  measurements taken today as time constraints limited reassessment last session. Pt has made little progress with BLE strength and hip ROM, however, she has shown some  improvements in lumbar ROM. Pt's biggest limitation at this time is her knee pain, which is limiting ability to complete functional strengthening of BLE  Extensive pt education was provided in today's treatment. Pt reports that she feels that her main problem is her knees, and she does not understand why she is being treated for her back and she feels like she is making little progress. It was explained to pt that treatment thus far has focused on core strengthening, hip strengthening, and BLE strengthening in order to improve stability and decrease stress and forces on the LE. After explanation, pt then began to report that she was concerned about nerve damage to her back, and she has been told by several healthcare providers that her nerves in her spine are damaged. It was explained that at this time, pt is not demonstrating signs of nerve irritation in her back, but it is diffuclt to fully test her lumbar spine due to pain in her knee, which is limtiing her ROM and ability to get into testing positions. Pt was encouraged to contact her MD to set up another appointment in order to discuss her concerns with him.    Pt will benefit from skilled therapeutic intervention in order to improve on the following deficits Decreased activity tolerance;Decreased endurance;Decreased mobility;Decreased range of motion;Decreased strength;Difficulty walking;Hypomobility;Pain   Rehab Potential Fair   Clinical Impairments Affecting Rehab Potential Fair prognosis due to chronicity of sx   PT Treatment/Interventions ADLs/Self Care Home Management;Cryotherapy;Electrical Stimulation;Moist Heat;Gait training;Stair training;Functional mobility training;Therapeutic activities;Therapeutic exercise;Balance training;Patient/family education;Manual techniques;Passive range of motion   PT Next Visit Plan Continue with core and BLE strengthening        Problem List Patient Active Problem List   Diagnosis Date Noted  . Right flank  discomfort 06/04/2013  . Dyspepsia 04/23/2013  . Dry mouth 04/23/2013  . GERD (gastroesophageal reflux disease) 11/20/2012  . Dysphagia, unspecified(787.20) 11/20/2012  . Adenomatous polyp 11/20/2012   Physical Therapy Progress Note  Dates of Reporting Period: 01/31/15 to 03/04/15  Objective Reports of Subjective Statement: Pt has made little progress towards LTGs in regards to strength, gait speed, and functional activity tolerance.   Objective Measurements: see above  Goal Update: see above  Plan: Pt encouraged to speak with referring MD regarding her symptoms, PT recommending to continue with current POC for core and BLE strengthening.   Reason Skilled Services are Required: Continuation of skilled PT services are recommended at this time in order to improve pt's BLE and core strength to return pt to optimal level of function. Pt encouraged to speak with her physician about her concerns regarding her knee pain.   Hilma Favors, PT, DPT [phone] 03/04/2015, 4:18 PM  [facility] [address] [place], [place], [address] Phone: [phone]   Fax:  [phone]  Name: CHIQUITA HECKERT MRN: [id number] Date of Birth: 04/17/1950

## 2015-03-07 ENCOUNTER — Ambulatory Visit (HOSPITAL_COMMUNITY): Payer: Self-pay | Admitting: Physical Therapy

## 2015-03-07 DIAGNOSIS — R262 Difficulty in walking, not elsewhere classified: Secondary | ICD-10-CM

## 2015-03-07 DIAGNOSIS — M5441 Lumbago with sciatica, right side: Secondary | ICD-10-CM

## 2015-03-07 DIAGNOSIS — M25562 Pain in left knee: Secondary | ICD-10-CM

## 2015-03-07 DIAGNOSIS — R6889 Other general symptoms and signs: Secondary | ICD-10-CM

## 2015-03-07 DIAGNOSIS — M25561 Pain in right knee: Secondary | ICD-10-CM

## 2015-03-07 NOTE — Therapy (Signed)
[facility] [address] [place], [place], [address] Phone: [phone]   Fax:  [phone]  Physical Therapy Treatment  Patient Details  Name: Shannon Gallegos MRN: [id number] Date of Birth: [birth year] Referring Provider: Aline Brochure  Encounter Date: 03/07/2015      PT End of Session - 03/07/15 1206    Visit Number 12   Number of Visits 18   Date for PT Re-Evaluation 04/04/15   Authorization Type CAFA   Authorization Time Period 02/02/15-04/04/15   PT Start Time 0935   PT Stop Time 1014   PT Time Calculation (min) 39 min   Activity Tolerance Patient limited by pain   Behavior During Therapy [facility] for tasks assessed/performed      Past Medical History  Diagnosis Date  . Borderline diabetes   . Hypertension   . Hypercholesterolemia   . GERD (gastroesophageal reflux disease)     Past Surgical History  Procedure Laterality Date  . Knee surgery      both  . Tubal ligation    . Colonoscopy  March 2014    Dr. Ellender Hose: sigmoid and descending colon diverticula, ADENOMATOUS polyp. Due for surveillance 2019  . Esophagogastroduodenoscopy (egd) with esophageal dilation N/A 12/09/2012    Dr. Oneida Alar: small hiatal hernia, Schatzki's ring at GE junction s/p Savary dilation, benign sessile polyps, moderate gastritis    There were no vitals filed for this visit.  Visit Diagnosis:  Midline low back pain with right-sided sciatica  Bilateral knee pain  Difficulty walking  Decreased functional activity tolerance      Subjective Assessment - 03/07/15 0940    Subjective Pt reports that she feels ok today, she is pretty stiff. She went to a birthday party and to church over the weekend. She reports that she had to leave the party early because of pain. She reports that she felt better after last treatment. Pt reports that she has been having a lot of back pain recently, and she feels like that is really a big problem for her. Pt then stated that  she felt that her knees were the biggest limitation, and she has been having a lot of trouble getting up and down stairs.     Currently in Pain? Yes   Pain Score 5    Pain Location Back   Pain Orientation Lower                         [facility] Adult PT Treatment/Exercise - 03/07/15 0001    Exercises   Exercises Knee/Hip   Lumbar Exercises: Stretches   Active Hamstring Stretch 3 reps;30 seconds   Active Hamstring Stretch Limitations supine with rope   Lumbar Exercises: Standing   Heel Raises 15 reps   Other Standing Lumbar Exercises sidestepping x 2RT   Lumbar Exercises: Seated   Sit to Stand Limitations unable to complete due to pain   Lumbar Exercises: Supine   Bridge 15 reps   Knee/Hip Exercises: Seated   Long Arc Quad Limitations attempted, unable to copmlete due to pain   Knee/Hip Exercises: Supine   Quad Sets 15 reps   Quad Sets Limitations 3" hold   Short Arc Quad Sets Limitations attempted, pt unable to complete due to pain                PT Education - 03/07/15 1213    Education provided Yes   Education Details Educated on returning to MD to obtain  referral for knee pain after discussing her symptoms with MD   Person(s) Educated Patient   Methods Explanation   Comprehension Verbalized understanding          PT Short Term Goals - 03/04/15 1058    PT SHORT TERM GOAL #1   Title Pt will be independent with HEP.   Baseline 12/7- reports she is trying to do it every day    Time 3   Period Weeks   Status Achieved   PT SHORT TERM GOAL #2   Title Improve lumbar flexion ROM to 65 degrees or greater to improve ability to bend forward.   Time 3   Period Weeks   Status Achieved   PT SHORT TERM GOAL #3   Title Improve strength of BLE to 4-/5 or greater to improve functional mobility and gait mechanics.    Time 3   Period Weeks   Status On-going   PT SHORT TERM GOAL #4   Title Improve functional mobility as evidenced by five time sit to stand  time of 50 seconds with UE support.    Baseline 12/9- unable to complete due to pain    Time 3   Period Weeks   Status On-going   PT SHORT TERM GOAL #5   Title Pt will ambulate 400 feet during 3 minute walk test to demonstrate improved gait speed.    Time 3   Period Weeks   Status Achieved           PT Long Term Goals - 03/04/15 1059    PT LONG TERM GOAL #1   Title Pt will be independent with advanced HEP.   Time 6   Period Weeks   Status On-going   PT LONG TERM GOAL #2   Title Improve lumbar ROM to 75 degrees of flexion or greater with 1/0 pain to allow pt to bend forward to complete self care.    Time 6   Period Weeks   Status On-going   PT LONG TERM GOAL #3   Title Increase BLE strength to 4+/5 to improve gait mechanics and ability to climb stairs without pain.   Time 6   Period Weeks   Status On-going   PT LONG TERM GOAL #4   Title Improve functional mobility and BLE strength evidenced by five time sit to stand time of 30 seconds or less without UE support   Time 6   Period Weeks   Status On-going   PT LONG TERM GOAL #5   Title Pt will ambulate 1,000 feet with equal step length, proper heel strike, and <3/10 pain to return pt to community ambulation.    Time 6   Period Weeks   Status On-going   PT LONG TERM GOAL #6   Title Pt will ascend/descend 12 steps with reciprocal pattern, one handrail, and <3/10 pain.    Time 6   Period Weeks   Status On-going               Plan - 03/07/15 1208    Clinical Impression Statement Pt presented to PT with c/o pain in her low back and buttock. Pt then began to report that her knees were her biggest problem, and that she was concerned because PT has not been focused on her knees or her ability to climb stairs. It was explained to the pt that the referral was for lumbar stabilization, so that was the main focus of treatment, however, we have been trying to  incorporate BLE strengthening within pt's tolerance. She stated  that she wanted to focus on LE strengthening today. Pt was unable to complete short arc quads, long arc quads, forward lunges on 7" step, or sit to stands from mat table elevated to 28" due to c/o knee pain. Pt was again encouraged to return to her referring physician to discuss her knee pain and obtain a referral for knee pain if appropriate.    PT Next Visit Plan Continue with core strengthening, BLE strengthening as tolerated        Problem List Patient Active Problem List   Diagnosis Date Noted  . Right flank discomfort 06/04/2013  . Dyspepsia 04/23/2013  . Dry mouth 04/23/2013  . GERD (gastroesophageal reflux disease) 11/20/2012  . Dysphagia, unspecified(787.20) 11/20/2012  . Adenomatous polyp 11/20/2012    Hilma Favors, PT, DPT [phone] 03/07/2015, 12:14 PM  [facility] [place], [place], [address] Phone: [phone]   Fax:  [phone]  Name: Shannon Gallegos MRN: [id number] Date of Birth: [birth year]

## 2015-03-09 ENCOUNTER — Ambulatory Visit (HOSPITAL_COMMUNITY): Payer: Self-pay

## 2015-03-09 DIAGNOSIS — R262 Difficulty in walking, not elsewhere classified: Secondary | ICD-10-CM

## 2015-03-09 DIAGNOSIS — M25561 Pain in right knee: Secondary | ICD-10-CM

## 2015-03-09 DIAGNOSIS — M25659 Stiffness of unspecified hip, not elsewhere classified: Secondary | ICD-10-CM

## 2015-03-09 DIAGNOSIS — M5441 Lumbago with sciatica, right side: Secondary | ICD-10-CM

## 2015-03-09 DIAGNOSIS — R6889 Other general symptoms and signs: Secondary | ICD-10-CM

## 2015-03-09 DIAGNOSIS — M25562 Pain in left knee: Secondary | ICD-10-CM

## 2015-03-09 NOTE — Therapy (Signed)
[place], [place], [address] Phone: [phone]   Fax:  [phone]  Physical Therapy Treatment  Patient Details  Name: Shannon Gallegos MRN: [id number] Date of Birth: [birth year] Referring Provider: Aline Brochure  Encounter Date: 03/09/2015      PT End of Session - 03/09/15 0939    Visit Number 13   Number of Visits 18   Date for PT Re-Evaluation 04/04/15   Authorization Type CAFA   Authorization Time Period 02/02/15-04/04/15   PT Start Time 0935   PT Stop Time 1018   PT Time Calculation (min) 43 min   Activity Tolerance Patient limited by pain   Behavior During Therapy [facility] for tasks assessed/performed      Past Medical History  Diagnosis Date  . Borderline diabetes   . Hypertension   . Hypercholesterolemia   . GERD (gastroesophageal reflux disease)     Past Surgical History  Procedure Laterality Date  . Knee surgery      both  . Tubal ligation    . Colonoscopy  March 2014    Dr. Ellender Hose: sigmoid and descending colon diverticula, ADENOMATOUS polyp. Due for surveillance 2019  . Esophagogastroduodenoscopy (egd) with esophageal dilation N/A 12/09/2012    Dr. Oneida Alar: small hiatal hernia, Schatzki's ring at GE junction s/p Savary dilation, benign sessile polyps, moderate gastritis    There were no vitals filed for this visit.  Visit Diagnosis:  Midline low back pain with right-sided sciatica  Bilateral knee pain  Difficulty walking  Hip stiffness, unspecified laterality  Decreased functional activity tolerance      Subjective Assessment - 03/09/15 0933    Subjective Pt stated pain scale 3-4/10 lower back and Bil knee.   Currently in Pain? Yes   Pain Score 4    Pain Location Back   Pain Orientation Lower;Left   Pain Descriptors / Indicators Aching;Sore   Pain Score 4   Pain Location Knee   Pain Orientation Right;Left           [facility] Adult PT Treatment/Exercise - 03/09/15 0001    Lumbar  Exercises: Stretches   Active Hamstring Stretch 3 reps;30 seconds   Active Hamstring Stretch Limitations supine with rope   Lumbar Exercises: Supine   Ab Set 15 reps;3 seconds   Bent Knee Raise 15 reps   Bent Knee Raise Limitations with ab set   Bridge 15 reps   Straight Leg Raise 10 reps   Lumbar Exercises: Sidelying   Hip Abduction 10 reps   Lumbar Exercises: Prone   Other Prone Lumbar Exercises heel squeeze 10x5"   Manual Therapy   Manual Therapy Muscle Energy Technique   Manual therapy comments performed initially this session with therex following   Muscle Energy Technique MET for Rt anterior rotation            PT Short Term Goals - 03/04/15 1058    PT SHORT TERM GOAL #1   Title Pt will be independent with HEP.   Baseline 12/7- reports she is trying to do it every day    Time 3   Period Weeks   Status Achieved   PT SHORT TERM GOAL #2   Title Improve lumbar flexion ROM to 65 degrees or greater to improve ability to bend forward.   Time 3   Period Weeks   Status Achieved   PT SHORT TERM GOAL #3   Title Improve strength of BLE to 4-/5 or greater to improve functional mobility  and gait mechanics.    Time 3   Period Weeks   Status On-going   PT SHORT TERM GOAL #4   Title Improve functional mobility as evidenced by five time sit to stand time of 50 seconds with UE support.    Baseline 12/9- unable to complete due to pain    Time 3   Period Weeks   Status On-going   PT SHORT TERM GOAL #5   Title Pt will ambulate 400 feet during 3 minute walk test to demonstrate improved gait speed.    Time 3   Period Weeks   Status Achieved           PT Long Term Goals - 03/04/15 1059    PT LONG TERM GOAL #1   Title Pt will be independent with advanced HEP.   Time 6   Period Weeks   Status On-going   PT LONG TERM GOAL #2   Title Improve lumbar ROM to 75 degrees of flexion or greater with 1/0 pain to allow pt to bend forward to complete self care.    Time 6   Period  Weeks   Status On-going   PT LONG TERM GOAL #3   Title Increase BLE strength to 4+/5 to improve gait mechanics and ability to climb stairs without pain.   Time 6   Period Weeks   Status On-going   PT LONG TERM GOAL #4   Title Improve functional mobility and BLE strength evidenced by five time sit to stand time of 30 seconds or less without UE support   Time 6   Period Weeks   Status On-going   PT LONG TERM GOAL #5   Title Pt will ambulate 1,000 feet with equal step length, proper heel strike, and <3/10 pain to return pt to community ambulation.    Time 6   Period Weeks   Status On-going   PT LONG TERM GOAL #6   Title Pt will ascend/descend 12 steps with reciprocal pattern, one handrail, and <3/10 pain.    Time 6   Period Weeks   Status On-going               Plan - 03/09/15 6203    Clinical Impression Statement Pt presented to PT with c/o pain in her lower back, buttock and Bil knees.  Pt reported increased pain last session following the addition of LE strengthening and wishes to focus on back strengthening only this session.  Discussion held with pt to return to referring physician to discuss knee pain and obtain a referral for knee if appropriate.  Pt presented with leg length discrepency, upon palpation noted Rt SI anterior rotation with reports of increased pain over Rt PSIS.  Muscle energy technqiue complete to improve SI alignment with equal leg length following manual.  Session focus on core strengthening exercises for SI alignment and hip strengthening.  Pt limited by knee pain with supine SLR with ability to lift LE approximately 3 in off mat, pt limited by weakness with hip and gluteal musculature.  End of session pt reports pain reduced to 2-3/10 in lower back.   PT Next Visit Plan Continue with core strengthening, BLE strengthening as tolerated        Problem List Patient Active Problem List   Diagnosis Date Noted  . Right flank discomfort 06/04/2013  .  Dyspepsia 04/23/2013  . Dry mouth 04/23/2013  . GERD (gastroesophageal reflux disease) 11/20/2012  . Dysphagia, unspecified(787.20) 11/20/2012  . Adenomatous  polyp 11/20/2012   I[place], [place]; [place]  Aldona Lento 03/09/2015, 12:49 PM  [facility] [address] [place], [place], [address] Phone: [phone]   Fax:  [phone]  Name: Shannon Gallegos MRN: [id number] Date of Birth: 04-May-1950

## 2015-03-11 ENCOUNTER — Encounter (HOSPITAL_COMMUNITY): Payer: Self-pay | Admitting: Physical Therapy

## 2015-03-14 ENCOUNTER — Ambulatory Visit (HOSPITAL_COMMUNITY): Payer: Self-pay | Admitting: Physical Therapy

## 2015-03-14 DIAGNOSIS — R6889 Other general symptoms and signs: Secondary | ICD-10-CM

## 2015-03-14 DIAGNOSIS — M25561 Pain in right knee: Secondary | ICD-10-CM

## 2015-03-14 DIAGNOSIS — M25562 Pain in left knee: Secondary | ICD-10-CM

## 2015-03-14 DIAGNOSIS — M5441 Lumbago with sciatica, right side: Secondary | ICD-10-CM

## 2015-03-14 DIAGNOSIS — R262 Difficulty in walking, not elsewhere classified: Secondary | ICD-10-CM

## 2015-03-14 DIAGNOSIS — M25659 Stiffness of unspecified hip, not elsewhere classified: Secondary | ICD-10-CM

## 2015-03-14 NOTE — Therapy (Signed)
[place], [place], [address] Phone: [phone]   Fax:  [phone]  Physical Therapy Treatment  Patient Details  Name: Shannon Gallegos MRN: [id number] Date of Birth: 1950/05/01 Referring Provider: Aline Brochure  Encounter Date: 03/14/2015      PT End of Session - 03/14/15 1117    Visit Number 14   Number of Visits 18   Date for PT Re-Evaluation 04/04/15   Authorization Type CAFA   Authorization Time Period 02/02/15-04/04/15   PT Start Time 0935   PT Stop Time 1015   PT Time Calculation (min) 40 min   Activity Tolerance Patient limited by pain   Behavior During Therapy [facility] for tasks assessed/performed      Past Medical History  Diagnosis Date  . Borderline diabetes   . Hypertension   . Hypercholesterolemia   . GERD (gastroesophageal reflux disease)     Past Surgical History  Procedure Laterality Date  . Knee surgery      both  . Tubal ligation    . Colonoscopy  March 2014    Dr. Ellender Hose: sigmoid and descending colon diverticula, ADENOMATOUS polyp. Due for surveillance 2019  . Esophagogastroduodenoscopy (egd) with esophageal dilation N/A 12/09/2012    Dr. Oneida Alar: small hiatal hernia, Schatzki's ring at GE junction s/p Savary dilation, benign sessile polyps, moderate gastritis    There were no vitals filed for this visit.  Visit Diagnosis:  Midline low back pain with right-sided sciatica  Bilateral knee pain  Difficulty walking  Hip stiffness, unspecified laterality  Decreased functional activity tolerance      Subjective Assessment - 03/14/15 0938    Subjective PT states she is hurting worse pain and has been the worst since she's been coming to therapy.  Pain up to 8-9/10 in lumbar and Bilateral knees. States she has not been doing her exercises only rubbing her knees and putting heat pack on them.    Currently in Pain? Yes   Pain Score 8    Pain Location Back   Pain Orientation Lower;Left   Pain Descriptors / Indicators Aching                         [facility] Adult PT Treatment/Exercise - 03/14/15 0940    Lumbar Exercises: Stretches   Active Hamstring Stretch 3 reps;30 seconds   Active Hamstring Stretch Limitations supine with rope   Lumbar Exercises: Supine   Ab Set 15 reps;3 seconds   Bent Knee Raise 15 reps   Bridge 15 reps   Straight Leg Raise 15 reps   Lumbar Exercises: Sidelying   Hip Abduction 15 reps   Lumbar Exercises: Prone   Straight Leg Raise 10 reps   Other Prone Lumbar Exercises heel squeeze 10x5"                  PT Short Term Goals - 03/04/15 1058    PT SHORT TERM GOAL #1   Title Pt will be independent with HEP.   Baseline 12/7- reports she is trying to do it every day    Time 3   Period Weeks   Status Achieved   PT SHORT TERM GOAL #2   Title Improve lumbar flexion ROM to 65 degrees or greater to improve ability to bend forward.   Time 3   Period Weeks   Status Achieved   PT SHORT TERM GOAL #3   Title Improve strength of BLE to 4-/5  or greater to improve functional mobility and gait mechanics.    Time 3   Period Weeks   Status On-going   PT SHORT TERM GOAL #4   Title Improve functional mobility as evidenced by five time sit to stand time of 50 seconds with UE support.    Baseline 12/9- unable to complete due to pain    Time 3   Period Weeks   Status On-going   PT SHORT TERM GOAL #5   Title Pt will ambulate 400 feet during 3 minute walk test to demonstrate improved gait speed.    Time 3   Period Weeks   Status Achieved           PT Long Term Goals - 03/04/15 1059    PT LONG TERM GOAL #1   Title Pt will be independent with advanced HEP.   Time 6   Period Weeks   Status On-going   PT LONG TERM GOAL #2   Title Improve lumbar ROM to 75 degrees of flexion or greater with 1/0 pain to allow pt to bend forward to complete self care.    Time 6   Period Weeks   Status On-going   PT LONG TERM GOAL #3   Title  Increase BLE strength to 4+/5 to improve gait mechanics and ability to climb stairs without pain.   Time 6   Period Weeks   Status On-going   PT LONG TERM GOAL #4   Title Improve functional mobility and BLE strength evidenced by five time sit to stand time of 30 seconds or less without UE support   Time 6   Period Weeks   Status On-going   PT LONG TERM GOAL #5   Title Pt will ambulate 1,000 feet with equal step length, proper heel strike, and <3/10 pain to return pt to community ambulation.    Time 6   Period Weeks   Status On-going   PT LONG TERM GOAL #6   Title Pt will ascend/descend 12 steps with reciprocal pattern, one handrail, and <3/10 pain.    Time 6   Period Weeks   Status On-going               Plan - 03/14/15 1124    Clinical Impression Statement PT reports no subjective improvment today.  Admits to not doing HEP and explained to patient progress would be limited if not completing exercises at home.  Pt states she 's in too much pain to complete her exercises.  Pt states the SI  alignment last session worsened her pain.  Pt would like to return to MD first before resuming therapy.  She has appt next thursday 12/29.   Completed therex without pain other than knee discomfort with prone hip extension.  Noted weakness with SLR and sidelying hip abduction with minimal elevation from mat.    PT Next Visit Plan Continue with core strengthening, BLE strengthening as tolerated.  Pt with appt scheduled after the new year and will resume at this point if recommended by MD.         Problem List Patient Active Problem List   Diagnosis Date Noted  . Right flank discomfort 06/04/2013  . Dyspepsia 04/23/2013  . Dry mouth 04/23/2013  . GERD (gastroesophageal reflux disease) 11/20/2012  . Dysphagia, unspecified(787.20) 11/20/2012  . Adenomatous polyp 11/20/2012   Teena Irani, PTA/CLT [phone] 03/14/2015, 11:30 AM  [place], [place],  [id number] Phone: [phone]   Fax:  [phone]  Name: ALEYNNA ROZEK MRN: [id number] Date of Birth: 1951/02/05

## 2015-03-16 ENCOUNTER — Encounter (HOSPITAL_COMMUNITY): Payer: Self-pay | Admitting: Physical Therapy

## 2015-03-24 ENCOUNTER — Ambulatory Visit (INDEPENDENT_AMBULATORY_CARE_PROVIDER_SITE_OTHER): Payer: Self-pay

## 2015-03-24 ENCOUNTER — Ambulatory Visit (INDEPENDENT_AMBULATORY_CARE_PROVIDER_SITE_OTHER): Payer: Self-pay | Admitting: Orthopedic Surgery

## 2015-03-24 VITALS — BP 165/101 | Ht 62.0 in | Wt 213.0 lb

## 2015-03-24 DIAGNOSIS — M25562 Pain in left knee: Secondary | ICD-10-CM

## 2015-03-24 DIAGNOSIS — M1712 Unilateral primary osteoarthritis, left knee: Secondary | ICD-10-CM

## 2015-03-24 DIAGNOSIS — M1711 Unilateral primary osteoarthritis, right knee: Secondary | ICD-10-CM

## 2015-03-24 DIAGNOSIS — S83242D Other tear of medial meniscus, current injury, left knee, subsequent encounter: Secondary | ICD-10-CM

## 2015-03-24 DIAGNOSIS — M5441 Lumbago with sciatica, right side: Secondary | ICD-10-CM

## 2015-03-24 NOTE — Progress Notes (Signed)
Follow up   Prior notes and review of systems updated no change from 01/27/2015   Shannon Gallegos is a 64 y.o. female   HPI:  Knee Pain: This 64 year old female actually presents with bilateral knee pain. She's had multiple surgeries on both knees. The right knee secondary to injuries back in 1980 details unclear at this. She does note dislocated kneecap on the right with torn meniscus and ligament repair 2 surgeries within 6 months and then in 2000 she had left knee arthroscopy due to wear and tear questionable if meniscus removed area she presents on the [employer] with pain swelling stiffness and atraumatic giving way symptoms of the knees right worse than left. She complains of sharp throbbing stabbing aching radiating pain which is constant morning and night and prevents her from doing any activity such as walking up and down the stairs standing to do housework kitchen activities such as cooking or washing dishes. She has been treated with hydrocodone, diclofenac and Tylenol after her ER visit she had oxycodone and ibuprofen  She had full injections back when her knees are undergoing surgeries and presents now for evaluation and treatment  Her review of systems is notable for constipation GU system is normal she does complaints of night sweats and fatigue some sore throat issues with dental problems vision problems holes fluid in her legs with edema depression anxiety and does complain of back pain and gait disturbance with the legs giving way. Dizziness weakness lightheadedness and tingling is also noted.   She continues to complain of left knee pain medial aspect also complains of anterior joint pain and pain going up the stairs. The injections we gave her did not help. She did have physical therapy for the L-spine with no improvement as well.  We reevaluated her left knee today BP 165/101 mmHg  Ht 5\' 2"  (1.575 m)  Wt 213 lb (96.616 kg)  BMI 38.95 kg/m2 Grooming hygiene are  normal. Oriented 3 Mood normal Ambulates walks without assistive device  Left knee she has medial joint line tenderness her knee flexion is still good her motor exam is normal the knee is stable her McMurray sign seem more provocative today with the neurovascular exam normal Skin normal intact without rash lesion or ulceration   Recommend MRI of the left knee. I did a new x-ray of the left knee and she has medial compartment joint space narrowing moderate. Not a very convincing knee based on the amount of pain she is having  Recommend MRI knee medial meniscal tear working diagnosis  If her MRI shows a torn meniscus of course we could address that but again she is not a good surgical candidate. I still think her back is the major problem.

## 2015-03-24 NOTE — Patient Instructions (Signed)
We will schedule MRI for you and call you with appt. 

## 2015-04-11 ENCOUNTER — Ambulatory Visit (HOSPITAL_COMMUNITY)
Admission: RE | Admit: 2015-04-11 | Discharge: 2015-04-11 | Disposition: A | Discharge status: Home / Self Care | Payer: Self-pay | Source: Ambulatory Visit | Attending: Orthopedic Surgery | Admitting: Orthopedic Surgery

## 2015-04-11 DIAGNOSIS — X58XXXD Exposure to other specified factors, subsequent encounter: Secondary | ICD-10-CM | POA: Insufficient documentation

## 2015-04-11 DIAGNOSIS — M1712 Unilateral primary osteoarthritis, left knee: Secondary | ICD-10-CM | POA: Insufficient documentation

## 2015-04-11 DIAGNOSIS — S83242D Other tear of medial meniscus, current injury, left knee, subsequent encounter: Secondary | ICD-10-CM | POA: Insufficient documentation

## 2015-04-11 DIAGNOSIS — M25461 Effusion, right knee: Secondary | ICD-10-CM | POA: Insufficient documentation

## 2015-04-11 DIAGNOSIS — S83012D Lateral subluxation of left patella, subsequent encounter: Secondary | ICD-10-CM | POA: Insufficient documentation

## 2015-04-19 ENCOUNTER — Ambulatory Visit (INDEPENDENT_AMBULATORY_CARE_PROVIDER_SITE_OTHER): Payer: No Typology Code available for payment source | Admitting: Orthopedic Surgery

## 2015-04-19 VITALS — BP 134/82 | Ht 62.0 in | Wt 213.0 lb

## 2015-04-19 DIAGNOSIS — M25562 Pain in left knee: Secondary | ICD-10-CM

## 2015-04-19 DIAGNOSIS — S83242D Other tear of medial meniscus, current injury, left knee, subsequent encounter: Secondary | ICD-10-CM

## 2015-04-19 DIAGNOSIS — M1712 Unilateral primary osteoarthritis, left knee: Secondary | ICD-10-CM

## 2015-04-19 NOTE — Patient Instructions (Signed)
Surgery- LEFT KNEE ARTHROSCOPY-  04/28/15                                                                                                                                                                                                                                                                                                                                                                                                         You have decided to proceed with operative arthroscopy of the knee. You have decided not to continue with nonoperative measures such as but not limited to oral medication, weight loss, activity modification, physical therapy, bracing, or injection.  We will perform operative arthroscopy of the knee. Some of the risks associated with arthroscopic surgery of the knee include but are not limited to Bleeding Infection Swelling Stiffness Blood clot Pain  If you're not comfortable with these risks and would like to continue with nonoperative treatment please let Dr. Aline Brochure know prior to your surgery.

## 2015-04-19 NOTE — Progress Notes (Signed)
preop appointment status post MRI left knee   Left knee pain   HPI:  Knee Pain: This 65 year old female actually presents with bilateral knee pain. She's had multiple surgeries on  right knee an arthroscopic surgery on the left. The right knee secondary to injuries back in 1980 details unclear at this. She does note dislocated kneecap on the right with torn meniscus and ligament repair 2 surgeries within 6 months and then in 2000 she had left knee arthroscopy due to wear and tear questionable if meniscus removed area she presents on the [employer] with pain swelling stiffness and atraumatic giving way symptoms of the knees right worse than left. She complains of sharp throbbing stabbing aching radiating pain which is constant morning and night and prevents her from doing any activity such as walking up and down the stairs standing to do housework kitchen activities such as cooking or washing dishes. She has been treated with hydrocodone, diclofenac and Tylenol after her ER visit she had oxycodone and ibuprofen  She had injections back when her knees were undergoing surgeries and presents now for evaluation and treatment  Her review of systems is notable for constipation GU system is normal she does complaints of night sweats and fatigue some sore throat issues with dental problems vision problems holes fluid in her legs with edema depression anxiety and does complain of back pain and gait disturbance with the legs giving way. Dizziness weakness lightheadedness and tingling is also noted.  Past Medical History  Diagnosis Date  . Borderline diabetes   . Hypertension   . Hypercholesterolemia   . GERD (gastroesophageal reflux disease)    Past Surgical History  Procedure Laterality Date  . Knee surgery      both  . Tubal ligation    . Colonoscopy  March 2014    Dr. Ellender Hose: sigmoid and descending colon diverticula, ADENOMATOUS polyp. Due for surveillance 2019  . Esophagogastroduodenoscopy (egd)  with esophageal dilation N/A 12/09/2012    Dr. Oneida Alar: small hiatal hernia, Schatzki's ring at GE junction s/p Savary dilation, benign sessile polyps, moderate gastritis   Family History  Problem Relation Age of Onset  . Colon cancer Neg Hx   . Cirrhosis Father     alcoholic   Social History  Substance Use Topics  . Smoking status: Never Smoker   . Smokeless tobacco: Not on file  . Alcohol Use: No    She continues to complain of left knee pain medial aspect also complains of anterior joint pain and pain going up the stairs. The injections we gave her did not help. She did have physical therapy for the L-spine with no improvement as well.    physical examination BP 165/101 mmHg  Ht 5\' 2"  (1.575 m)  Wt 213 lb (96.616 kg)  BMI 38.95 kg/m2 Grooming hygiene are normal. Oriented 3 Mood normal Ambulates walks without assistive device   her upper extremities are normal  Right knee Shenton's no effusion, mild tenderness no swelling of the soft tissues motor exam is intact knee is stable and neurovascular exam is intact election arc 120 Left knee she has medial joint line tenderness her knee flexion is still good her motor exam is normal the knee is stable her McMurray sign seem more provocative today with the neurovascular exam normal Skin normal intact without rash lesion or ulceration   Impression  Torn medial meniscus left knee  Osteoarthritis left knee    Our plan is to do an arthroscopic surgery  Left knee  to evaluate the knee for meniscal tear.  Planning meniscectomy if tear found.   I made sure that I went over this with Shannon Gallegos and her husband based on the appearance of the MRI it is unclear whether her meniscus is torn or that her meniscus looks bad because of the previous surgery

## 2015-04-25 NOTE — Patient Instructions (Signed)
NYELA SLUYTER  04/25/2015     @PREFPERIOPPHARMACY @   Your procedure is scheduled on 04/28/2015   Report to [facility] at  39  A.M.  Call this number if you have problems the morning of surgery:  [phone]   Remember:  Do not eat food or drink liquids after midnight.  Take these medicines the morning of surgery with A SIP OF WATER  Voltaren, nexium, neurontin, hydrocodone, antivert.   Do not wear jewelry, make-up or nail polish.  Do not wear lotions, powders, or perfumes.  You may wear deodorant.  Do not shave 48 hours prior to surgery.  Men may shave face and neck.  Do not bring valuables to the hospital.  [facility] is not responsible for any belongings or valuables.  Contacts, dentures or bridgework may not be worn into surgery.  Leave your suitcase in the car.  After surgery it may be brought to your room.  For patients admitted to the hospital, discharge time will be determined by your treatment team.  Patients discharged the day of surgery will not be allowed to drive home.   Name and phone number of your driver:   family Special instructions:  none  Please read over the following fact sheets that you were given. Coughing and Deep Breathing, Surgical Site Infection Prevention, Anesthesia Post-op Instructions and Care and Recovery After Surgery      Meniscus Injury, Arthroscopy Arthroscopy is a surgical procedure that involves the use of a small scope that has a camera and surgical instruments on the end (arthroscope). An arthroscope can be used to repair your meniscus injury.  LET [facility] CARE PROVIDER KNOW ABOUT:  Any allergies you have.  All medicines you are taking, including vitamins, herbs, eyedrops, creams, and over-the-counter medicines.  Any recent colds or infections you have had or currently have.  Previous problems you or members of your family have had with the use of anesthetics.  Any blood disorders or blood clotting problems  you have.  Previous surgeries you have had.  Medical conditions you have. RISKS AND COMPLICATIONS Generally, this is a safe procedure. However, as with any procedure, problems can occur. Possible problems include:  Damage to nerves or blood vessels.  Excess bleeding.  Blood clots.  Infection. BEFORE THE PROCEDURE  Do not eat or drink for 6-8 hours before the procedure.  Take medicines as directed by your surgeon. Ask your surgeon about changing or stopping your regular medicines.  You may have lab tests the morning of surgery. PROCEDURE  You will be given one of the following:   A medicine that numbs the area (local anesthesia).  A medicine that makes you go to sleep (general anesthesia).  A medicine injected into your spine that numbs your body below the waist (spinal anesthesia). Most often, several small cuts (incisions) are made in the knee. The arthroscope and instruments go into the incisions to repair the damage. The torn portion of the meniscus is removed.  During this time, your surgeon may find a partial or complete tear in a cruciate ligament, such as the anterior cruciate ligament (ACL). A completely torn cruciate ligament is reconstructed by taking tissue from another part of the body (grafting) and placing it into the injured area. This requires several larger incisions to complete the repair. Sometimes, open surgery is needed for collateral ligament injuries. If a collateral ligament is found to be injured, your surgeon may staple or  suture the tear through a slightly larger incision on the side of the knee. AFTER THE PROCEDURE You will be taken to the recovery area where your progress will be monitored. When you are awake, stable, and taking fluids without complications, you will be allowed to go home. This is usually the same day. However, more extensive repairs of a ligament may require an overnight stay.  The recovery time after repairing your meniscus or ligament  depends on the amount of damage to these structures. It also depends on whether or not reconstructive knee surgery was needed.   A torn or stretched ligament (ligament sprain) may take 6-8 weeks to heal. It takes about the same amount of time if your surgeon removed a torn meniscus.  A repaired meniscus may require 6-12 weeks of recovery time.  A torn ligament needing reconstructive surgery may take 6-12 months to heal fully.   This information is not intended to replace advice given to you by your health care provider. Make sure you discuss any questions you have with your health care provider.   Document Released: 03/09/2000 Document Revised: 03/17/2013 Document Reviewed: 08/08/2012 Elsevier Interactive Patient Education 2016 [employer]. Arthroscopy, With Meniscus Injury, Care After Refer to this sheet in the next few weeks. These instructions provide you with general information on caring for yourself after your procedure. Your health care provider may also give you specific instructions. Your treatment has been planned according to the current medical practices, but problems sometimes occur. Call your health care provider if you have any problems or questions after your procedure. WHAT TO EXPECT AFTER THE PROCEDURE After your procedure, it is typical to have the following:  Pain and swelling in your knee.  Constipation.  Difficulty walking. HOME CARE INSTRUCTIONS   Use crutches and do knee exercises as directed by your health care provider.  Apply ice to the injured area:  Put ice in a plastic bag.  Place a towel between your skin and the bag.  Leave the ice on for 15-20 minutes, 3-4 times a day while awake. Do this for the first 2 days.  Rest and raise (elevate) your knee.  Change bandages (dressings) as directed by your health care provider.  Keep the wound dry and clean. The wound may be washed gently with soap and water. Gently blot or dab the wound dry. It is okay to  take showers 24-48 hours after surgery. Do not take baths, use swimming pools, or use hot tubs for 14 days, or as directed by your health care provider.  Only take over-the-counter or prescription medicines for pain, discomfort, or fever as directed by your health care provider.  Continue your normal diet as directed by your health care provider.  Do not lift anything more than 10 pounds or play contact sports for 3 weeks, or as directed by your health care provider.  If a brace was applied, use as directed by your health care provider.  Your health care provider will help with instructions for rehabilitation of your knee. SEEK MEDICAL CARE IF:   You have increased bleeding (more than a small spot) from the wound.  You have redness, swelling, or increasing pain in the wound.  Yellowish-white fluid (pus) is coming from your wound. SEEK IMMEDIATE MEDICAL CARE IF:   You develop a rash.  You have a fever or persistent symptoms for more than 2-3 days.  You have difficulty breathing.  You have increasing pain with movement of the knee. MAKE SURE YOU:  Understand these instructions.  Will watch your condition.  Will get help right away if you are not doing well or get worse.   This information is not intended to replace advice given to you by your health care provider. Make sure you discuss any questions you have with your health care provider.   Document Released: 09/29/2004 Document Revised: 11/12/2012 Document Reviewed: 08/19/2012 Elsevier Interactive Patient Education 2016 [employer] PATIENT INSTRUCTIONS POST-ANESTHESIA  IMMEDIATELY FOLLOWING SURGERY:  Do not drive or operate machinery for the first twenty four hours after surgery.  Do not make any important decisions for twenty four hours after surgery or while taking narcotic pain medications or sedatives.  If you develop intractable nausea and vomiting or a severe headache please notify your doctor  immediately.  FOLLOW-UP:  Please make an appointment with your surgeon as instructed. You do not need to follow up with anesthesia unless specifically instructed to do so.  WOUND CARE INSTRUCTIONS (if applicable):  Keep a dry clean dressing on the anesthesia/puncture wound site if there is drainage.  Once the wound has quit draining you may leave it open to air.  Generally you should leave the bandage intact for twenty four hours unless there is drainage.  If the epidural site drains for more than 36-48 hours please call the anesthesia department.  QUESTIONS?:  Please feel free to call your physician or the hospital operator if you have any questions, and they will be happy to assist you.

## 2015-04-26 ENCOUNTER — Encounter (HOSPITAL_COMMUNITY)
Admission: RE | Admit: 2015-04-26 | Discharge: 2015-04-26 | Disposition: A | Discharge status: Home / Self Care | Payer: No Typology Code available for payment source | Source: Ambulatory Visit | Attending: Orthopedic Surgery | Admitting: Orthopedic Surgery

## 2015-04-26 ENCOUNTER — Other Ambulatory Visit: Payer: Self-pay

## 2015-04-26 ENCOUNTER — Encounter (HOSPITAL_COMMUNITY): Payer: Self-pay

## 2015-04-26 DIAGNOSIS — Z01810 Encounter for preprocedural cardiovascular examination: Secondary | ICD-10-CM | POA: Insufficient documentation

## 2015-04-26 DIAGNOSIS — Z01812 Encounter for preprocedural laboratory examination: Secondary | ICD-10-CM | POA: Insufficient documentation

## 2015-04-26 HISTORY — DX: Unspecified osteoarthritis, unspecified site: M19.90

## 2015-04-26 HISTORY — DX: Dizziness and giddiness: R42

## 2015-04-26 LAB — BASIC METABOLIC PANEL
Anion gap: 9 (ref 5–15)
BUN: 14 mg/dL (ref 6–20)
CO2: 34 mmol/L — ABNORMAL HIGH (ref 22–32)
CREATININE: 0.82 mg/dL (ref 0.44–1.00)
Calcium: 9.6 mg/dL (ref 8.9–10.3)
Chloride: 99 mmol/L — ABNORMAL LOW (ref 101–111)
GFR calc Af Amer: >60 mL/min (ref >60)
Glucose, Bld: 116 mg/dL — ABNORMAL HIGH (ref 65–99)
Potassium: 2.8 mmol/L — ABNORMAL LOW (ref 3.5–5.1)
SODIUM: 142 mmol/L (ref 135–145)

## 2015-04-26 LAB — CBC WITH DIFFERENTIAL/PLATELET
BASOS PCT: 0 %
Basophils Absolute: 0 10*3/uL (ref 0.0–0.1)
EOS ABS: 0.2 10*3/uL (ref 0.0–0.7)
EOS PCT: 3 %
HCT: 39.5 % (ref 36.0–46.0)
Hemoglobin: 12.9 g/dL (ref 12.0–15.0)
LYMPHS ABS: 1.6 10*3/uL (ref 0.7–4.0)
Lymphocytes Relative: 28 %
MCH: 29.5 pg (ref 26.0–34.0)
MCHC: 32.7 g/dL (ref 30.0–36.0)
MCV: 90.4 fL (ref 78.0–100.0)
MONOS PCT: 5 %
Monocytes Absolute: 0.3 10*3/uL (ref 0.1–1.0)
Neutro Abs: 3.6 10*3/uL (ref 1.7–7.7)
Neutrophils Relative %: 64 %
PLATELETS: 189 10*3/uL (ref 150–400)
RBC: 4.37 MIL/uL (ref 3.87–5.11)
RDW: 13.7 % (ref 11.5–15.5)
WBC: 5.7 10*3/uL (ref 4.0–10.5)

## 2015-04-26 NOTE — Pre-Procedure Instructions (Signed)
Patient given information to sign up for my chart at home. 

## 2015-04-26 NOTE — H&P (Signed)
HPI:  Knee Pain: This 65 year old female actually presents with bilateral knee pain. She's had multiple surgeries on both knees. The right knee secondary to injuries back in 1980 details unclear at this. She does note dislocated kneecap on the right with torn meniscus and ligament repair 2 surgeries within 6 months and then in 2000 she had left knee arthroscopy due to wear and tear questionable if meniscus removed area she presents on the [employer] with pain swelling stiffness and atraumatic giving way symptoms of the knees right worse than left. She complains of sharp throbbing stabbing aching radiating pain which is constant morning and night and prevents her from doing any activity such as walking up and down the stairs standing to do housework kitchen activities such as cooking or washing dishes. She has been treated with hydrocodone, diclofenac and Tylenol after her ER visit she had oxycodone and ibuprofen  She had full injections back when her knees are undergoing surgeries and presents now for evaluation and treatment  Her review of systems is notable for constipation GU system is normal she does complaints of night sweats and fatigue some sore throat issues with dental problems vision problems holes fluid in her legs with edema depression anxiety and does complain of back pain and gait disturbance with the legs giving way. Dizziness weakness lightheadedness and tingling is also noted.  She continues to complain of left knee pain medial aspect also complains of anterior joint pain and pain going up the stairs. The injections we gave her did not help. She did have physical therapy for the L-spine with no improvement as well.  Past Medical History  Diagnosis Date  . Borderline diabetes   . Hypertension   . Hypercholesterolemia   . GERD (gastroesophageal reflux disease)   . Vertigo   . Arthritis    Past Surgical History  Procedure Laterality Date  . Knee surgery Bilateral      arthroscopies  . Tubal ligation    . Colonoscopy  March 2014    Dr. Ellender Hose: sigmoid and descending colon diverticula, ADENOMATOUS polyp. Due for surveillance 2019  . Esophagogastroduodenoscopy (egd) with esophageal dilation N/A 12/09/2012    Dr. Oneida Alar: small hiatal hernia, Schatzki's ring at GE junction s/p Savary dilation, benign sessile polyps, moderate gastritis  . Cataract extraction Bilateral    Social History  Substance Use Topics  . Smoking status: Never Smoker   . Smokeless tobacco: Not on file  . Alcohol Use: No   Family History  Problem Relation Age of Onset  . Colon cancer Neg Hx   . Cirrhosis Father     alcoholic    EXAMINATION   We reevaluated her left knee today BP 165/101 mmHg  Ht 5\' 2"  (1.575 m)  Wt 213 lb (96.616 kg)  BMI 38.95 kg/m2 Grooming hygiene are normal. Oriented 3 Mood normal Ambulates walks without assistive device  Left knee she has medial joint line tenderness her knee flexion is still good her motor exam is normal the knee is stable her McMurray sign seem more provocative today with the neurovascular exam normal Skin normal intact without rash lesion or ulceration  Upper extremities normal   MRI: IMPRESSION: 1. Large radial tear posterior horn medial meniscus, with a significant gap in the meniscus. 2. Degenerative grade 3 signal in the anterior horn lateral meniscus contacting the inferior meniscal surface. 3. Fluid signal and slight expansion of the ACL suggesting degeneration or small ACL cyst. 4. Osteoarthritis, severe in the patellofemoral joint and moderate  to prominent in the medial compartment. Lateral patellar tilt and mild lateral patellar subluxation. 5. Thickened and indistinct lateral patellar retinaculum, query prior release. There is mild fibrosis tracking in a vertical band below the lateral patellar retinaculum. 6. Small knee effusion.     Electronically Signed   By: Van Clines M.D.   On: 04/11/2015  16:56  MEDIAL MENISCUS TEAR LEFT KNEE   Salk MEDIAL MENISECTOMY   Risks and benefits and complications explained including alternative treatments. The patient has opted for surgical treatment

## 2015-04-27 ENCOUNTER — Other Ambulatory Visit: Payer: Self-pay | Admitting: Orthopedic Surgery

## 2015-04-27 ENCOUNTER — Telehealth: Payer: Self-pay | Admitting: Orthopedic Surgery

## 2015-04-27 MED ORDER — POTASSIUM CHLORIDE CRYS ER 20 MEQ PO TBCR: 40.0000 meq | EXTENDED_RELEASE_TABLET | Freq: Three times a day (TID) | ORAL | Status: DC

## 2015-04-27 NOTE — Pre-Procedure Instructions (Signed)
Potassium 2.8. Dr Patsey Berthold aware and wants supplement given and recheck potassium on arrival. Dr Aline Brochure notified and will call in Rx.

## 2015-04-27 NOTE — Telephone Encounter (Signed)
Told to get potassium from drug store

## 2015-04-27 NOTE — Pre-Procedure Instructions (Signed)
Patient notified that Rx shoud be called in for potassium. She will contact office if she hasn't heard from them in about an hour.

## 2015-04-28 ENCOUNTER — Ambulatory Visit (HOSPITAL_COMMUNITY): Payer: Self-pay | Admitting: Anesthesiology

## 2015-04-28 ENCOUNTER — Encounter (HOSPITAL_COMMUNITY)
Admission: RE | Disposition: A | Discharge status: Home / Self Care | Payer: Self-pay | Source: Ambulatory Visit | Attending: Orthopedic Surgery

## 2015-04-28 ENCOUNTER — Encounter (HOSPITAL_COMMUNITY): Payer: Self-pay | Admitting: *Deleted

## 2015-04-28 ENCOUNTER — Ambulatory Visit (HOSPITAL_COMMUNITY)
Admission: RE | Admit: 2015-04-28 | Discharge: 2015-04-28 | Disposition: A | Discharge status: Home / Self Care | Payer: Self-pay | Source: Ambulatory Visit | Attending: Orthopedic Surgery | Admitting: Orthopedic Surgery

## 2015-04-28 DIAGNOSIS — M129 Arthropathy, unspecified: Secondary | ICD-10-CM

## 2015-04-28 DIAGNOSIS — Z79899 Other long term (current) drug therapy: Secondary | ICD-10-CM | POA: Insufficient documentation

## 2015-04-28 DIAGNOSIS — Z7951 Long term (current) use of inhaled steroids: Secondary | ICD-10-CM | POA: Insufficient documentation

## 2015-04-28 DIAGNOSIS — M1712 Unilateral primary osteoarthritis, left knee: Secondary | ICD-10-CM | POA: Insufficient documentation

## 2015-04-28 DIAGNOSIS — E7800 Pure hypercholesterolemia, unspecified: Secondary | ICD-10-CM | POA: Insufficient documentation

## 2015-04-28 DIAGNOSIS — M23222 Derangement of posterior horn of medial meniscus due to old tear or injury, left knee: Secondary | ICD-10-CM | POA: Insufficient documentation

## 2015-04-28 DIAGNOSIS — S83242A Other tear of medial meniscus, current injury, left knee, initial encounter: Secondary | ICD-10-CM | POA: Insufficient documentation

## 2015-04-28 DIAGNOSIS — M1710 Unilateral primary osteoarthritis, unspecified knee: Secondary | ICD-10-CM | POA: Insufficient documentation

## 2015-04-28 DIAGNOSIS — K219 Gastro-esophageal reflux disease without esophagitis: Secondary | ICD-10-CM | POA: Insufficient documentation

## 2015-04-28 DIAGNOSIS — R7303 Prediabetes: Secondary | ICD-10-CM | POA: Insufficient documentation

## 2015-04-28 DIAGNOSIS — Z9889 Other specified postprocedural states: Secondary | ICD-10-CM

## 2015-04-28 DIAGNOSIS — I10 Essential (primary) hypertension: Secondary | ICD-10-CM | POA: Insufficient documentation

## 2015-04-28 DIAGNOSIS — Z791 Long term (current) use of non-steroidal anti-inflammatories (NSAID): Secondary | ICD-10-CM | POA: Insufficient documentation

## 2015-04-28 HISTORY — PX: KNEE ARTHROSCOPY WITH MEDIAL MENISECTOMY: SHX5651

## 2015-04-28 LAB — GLUCOSE, CAPILLARY: Glucose-Capillary: 92 mg/dL (ref 65–99)

## 2015-04-28 LAB — POCT I-STAT 4, (NA,K, GLUC, HGB,HCT)
GLUCOSE: 93 mg/dL (ref 65–99)
HEMATOCRIT: 41 % (ref 36.0–46.0)
HEMOGLOBIN: 13.9 g/dL (ref 12.0–15.0)
POTASSIUM: 3.9 mmol/L (ref 3.5–5.1)
Sodium: 141 mmol/L (ref 135–145)

## 2015-04-28 SURGERY — ARTHROSCOPY, KNEE, WITH MEDIAL MENISCECTOMY
Anesthesia: General | Site: Knee | Laterality: Left

## 2015-04-28 MED ORDER — HYDROCODONE-ACETAMINOPHEN 5-325 MG PO TABS
2.0000 | ORAL_TABLET | Freq: Once | ORAL | Status: AC
  Administered 2015-04-28: 2 via ORAL
  Filled 2015-04-28: qty 2

## 2015-04-28 MED ORDER — FENTANYL CITRATE (PF) 100 MCG/2ML IJ SOLN
25.0000 ug | INTRAMUSCULAR | Status: AC
  Administered 2015-04-28 (×2): 25 ug via INTRAVENOUS

## 2015-04-28 MED ORDER — ONDANSETRON HCL 4 MG/2ML IJ SOLN
4.0000 mg | Freq: Once | INTRAMUSCULAR | Status: DC | PRN
Start: 2015-04-28 — End: 2015-04-29

## 2015-04-28 MED ORDER — SODIUM CHLORIDE 0.9 % IJ SOLN
INTRAMUSCULAR | Status: AC
  Filled 2015-04-28: qty 10

## 2015-04-28 MED ORDER — CEFAZOLIN SODIUM-DEXTROSE 2-3 GM-% IV SOLR
INTRAVENOUS | Status: AC
  Filled 2015-04-28: qty 50

## 2015-04-28 MED ORDER — KETOROLAC TROMETHAMINE 30 MG/ML IJ SOLN
30.0000 mg | Freq: Once | INTRAMUSCULAR | Status: AC
  Administered 2015-04-28: 30 mg via INTRAVENOUS
  Filled 2015-04-28: qty 1

## 2015-04-28 MED ORDER — LIDOCAINE HCL (CARDIAC) 20 MG/ML IV SOLN
INTRAVENOUS | Status: DC | PRN
  Administered 2015-04-28: 25 mg via INTRAVENOUS

## 2015-04-28 MED ORDER — PROPOFOL 10 MG/ML IV BOLUS
INTRAVENOUS | Status: DC | PRN
  Administered 2015-04-28: 130 mg via INTRAVENOUS
  Administered 2015-04-28: 20 mg via INTRAVENOUS

## 2015-04-28 MED ORDER — FENTANYL CITRATE (PF) 100 MCG/2ML IJ SOLN
INTRAMUSCULAR | Status: AC
  Filled 2015-04-28: qty 2

## 2015-04-28 MED ORDER — PHENYLEPHRINE 40 MCG/ML (10ML) SYRINGE FOR IV PUSH (FOR BLOOD PRESSURE SUPPORT)
PREFILLED_SYRINGE | INTRAVENOUS | Status: AC
  Filled 2015-04-28: qty 10

## 2015-04-28 MED ORDER — PHENYLEPHRINE HCL 10 MG/ML IJ SOLN
INTRAMUSCULAR | Status: DC | PRN
  Administered 2015-04-28: 80 ug via INTRAVENOUS

## 2015-04-28 MED ORDER — POTASSIUM CHLORIDE CRYS ER 20 MEQ PO TBCR: 20.0000 meq | EXTENDED_RELEASE_TABLET | Freq: Every day | ORAL | Status: AC

## 2015-04-28 MED ORDER — SODIUM CHLORIDE 0.9 % IR SOLN
Status: DC | PRN
  Administered 2015-04-28 (×4): 3000 mL

## 2015-04-28 MED ORDER — SODIUM CHLORIDE 0.9 % IR SOLN
Status: DC | PRN
  Administered 2015-04-28: 1000 mL

## 2015-04-28 MED ORDER — CHLORHEXIDINE GLUCONATE 4 % EX LIQD: 60.0000 mL | Freq: Once | CUTANEOUS | Status: DC

## 2015-04-28 MED ORDER — MIDAZOLAM HCL 2 MG/2ML IJ SOLN
1.0000 mg | INTRAMUSCULAR | Status: DC | PRN
  Administered 2015-04-28: 2 mg via INTRAVENOUS

## 2015-04-28 MED ORDER — BUPIVACAINE-EPINEPHRINE (PF) 0.5% -1:200000 IJ SOLN
INTRAMUSCULAR | Status: DC | PRN
  Administered 2015-04-28: 60 mL

## 2015-04-28 MED ORDER — EPINEPHRINE HCL 1 MG/ML IJ SOLN
INTRAMUSCULAR | Status: AC
  Filled 2015-04-28: qty 5

## 2015-04-28 MED ORDER — EPHEDRINE SULFATE 50 MG/ML IJ SOLN
INTRAMUSCULAR | Status: AC
  Filled 2015-04-28: qty 1

## 2015-04-28 MED ORDER — EPHEDRINE SULFATE 50 MG/ML IJ SOLN
INTRAMUSCULAR | Status: DC | PRN
  Administered 2015-04-28: 5 mg via INTRAVENOUS

## 2015-04-28 MED ORDER — LACTATED RINGERS IV SOLN
INTRAVENOUS | Status: DC
  Administered 2015-04-28 (×2): via INTRAVENOUS

## 2015-04-28 MED ORDER — DICLOFENAC SODIUM 75 MG PO TBEC: 75.0000 mg | DELAYED_RELEASE_TABLET | Freq: Two times a day (BID) | ORAL | Status: DC

## 2015-04-28 MED ORDER — MIDAZOLAM HCL 2 MG/2ML IJ SOLN
INTRAMUSCULAR | Status: AC
  Filled 2015-04-28: qty 2

## 2015-04-28 MED ORDER — DEXAMETHASONE SODIUM PHOSPHATE 4 MG/ML IJ SOLN
4.0000 mg | Freq: Once | INTRAMUSCULAR | Status: AC
  Administered 2015-04-28: 4 mg via INTRAVENOUS

## 2015-04-28 MED ORDER — GLYCOPYRROLATE 0.2 MG/ML IJ SOLN
INTRAMUSCULAR | Status: DC | PRN
  Administered 2015-04-28: 0.2 mg via INTRAVENOUS

## 2015-04-28 MED ORDER — BUPIVACAINE-EPINEPHRINE (PF) 0.5% -1:200000 IJ SOLN
INTRAMUSCULAR | Status: AC
  Filled 2015-04-28: qty 60

## 2015-04-28 MED ORDER — CEFAZOLIN SODIUM-DEXTROSE 2-3 GM-% IV SOLR
2.0000 g | INTRAVENOUS | Status: AC
  Administered 2015-04-28: 2 g via INTRAVENOUS

## 2015-04-28 MED ORDER — FENTANYL CITRATE (PF) 100 MCG/2ML IJ SOLN: 25.0000 ug | INTRAMUSCULAR | Status: DC | PRN

## 2015-04-28 MED ORDER — ONDANSETRON HCL 4 MG/2ML IJ SOLN
4.0000 mg | Freq: Once | INTRAMUSCULAR | Status: AC
  Administered 2015-04-28: 4 mg via INTRAVENOUS
  Filled 2015-04-28: qty 2

## 2015-04-28 MED ORDER — ONDANSETRON HCL 4 MG/2ML IJ SOLN
4.0000 mg | Freq: Once | INTRAMUSCULAR | Status: AC
  Administered 2015-04-28: 4 mg via INTRAVENOUS

## 2015-04-28 MED ORDER — FENTANYL CITRATE (PF) 100 MCG/2ML IJ SOLN
INTRAMUSCULAR | Status: DC | PRN
  Administered 2015-04-28 (×2): 25 ug via INTRAVENOUS
  Administered 2015-04-28: 50 ug via INTRAVENOUS

## 2015-04-28 MED ORDER — PREGABALIN 50 MG PO CAPS
50.0000 mg | ORAL_CAPSULE | Freq: Once | ORAL | Status: AC
  Administered 2015-04-28: 50 mg via ORAL
  Filled 2015-04-28: qty 1

## 2015-04-28 MED ORDER — ONDANSETRON HCL 4 MG/2ML IJ SOLN
INTRAMUSCULAR | Status: AC
  Filled 2015-04-28: qty 2

## 2015-04-28 MED ORDER — HYDROCODONE-ACETAMINOPHEN 5-325 MG PO TABS: 1.0000 | ORAL_TABLET | Freq: Four times a day (QID) | ORAL | Status: DC | PRN

## 2015-04-28 MED ORDER — DEXAMETHASONE SODIUM PHOSPHATE 4 MG/ML IJ SOLN
INTRAMUSCULAR | Status: AC
  Filled 2015-04-28: qty 1

## 2015-04-28 SURGICAL SUPPLY — 46 items
BAG HAMPER (MISCELLANEOUS) ×3 IMPLANT
BANDAGE ELASTIC 6 VELCRO NS (GAUZE/BANDAGES/DRESSINGS) ×3 IMPLANT
BLADE AGGRESSIVE PLUS 4.0 (BLADE) ×3 IMPLANT
BLADE SURG SZ11 CARB STEEL (BLADE) ×3 IMPLANT
CHLORAPREP W/TINT 26ML (MISCELLANEOUS) ×6 IMPLANT
CLOTH BEACON ORANGE TIMEOUT ST (SAFETY) ×3 IMPLANT
COOLER CRYO IC GRAV AND TUBE (ORTHOPEDIC SUPPLIES) ×3 IMPLANT
CUFF CRYO KNEE18X23 MED (MISCELLANEOUS) ×3 IMPLANT
CUFF TOURNIQUET SINGLE 34IN LL (TOURNIQUET CUFF) ×3 IMPLANT
DECANTER SPIKE VIAL GLASS SM (MISCELLANEOUS) ×6 IMPLANT
GAUZE SPONGE 4X4 12PLY STRL (GAUZE/BANDAGES/DRESSINGS) ×3 IMPLANT
GAUZE SPONGE 4X4 16PLY XRAY LF (GAUZE/BANDAGES/DRESSINGS) ×3 IMPLANT
GAUZE XEROFORM 5X9 LF (GAUZE/BANDAGES/DRESSINGS) ×3 IMPLANT
GLOVE BIOGEL PI IND STRL 7.0 (GLOVE) ×3 IMPLANT
GLOVE BIOGEL PI INDICATOR 7.0 (GLOVE) ×6
GLOVE ECLIPSE 6.5 STRL STRAW (GLOVE) ×3 IMPLANT
GLOVE ECLIPSE 7.0 STRL STRAW (GLOVE) ×3 IMPLANT
GLOVE SKINSENSE NS SZ8.0 LF (GLOVE) ×2
GLOVE SKINSENSE STRL SZ8.0 LF (GLOVE) ×1 IMPLANT
GLOVE SS N UNI LF 8.5 STRL (GLOVE) ×3 IMPLANT
GOWN STRL REUS W/ TWL LRG LVL3 (GOWN DISPOSABLE) ×1 IMPLANT
GOWN STRL REUS W/TWL LRG LVL3 (GOWN DISPOSABLE) ×2
GOWN STRL REUS W/TWL XL LVL3 (GOWN DISPOSABLE) ×3 IMPLANT
HLDR LEG FOAM (MISCELLANEOUS) ×1 IMPLANT
IV NS IRRIG 3000ML ARTHROMATIC (IV SOLUTION) ×12 IMPLANT
KIT BLADEGUARD II DBL (SET/KITS/TRAYS/PACK) ×3 IMPLANT
KIT ROOM TURNOVER AP CYSTO (KITS) ×3 IMPLANT
LEG HOLDER FOAM (MISCELLANEOUS) ×2
MANIFOLD NEPTUNE II (INSTRUMENTS) ×3 IMPLANT
MARKER SKIN DUAL TIP RULER LAB (MISCELLANEOUS) ×3 IMPLANT
NEEDLE HYPO 18GX1.5 BLUNT FILL (NEEDLE) ×3 IMPLANT
NEEDLE HYPO 21X1.5 SAFETY (NEEDLE) ×3 IMPLANT
NEEDLE SPNL 18GX3.5 QUINCKE PK (NEEDLE) ×3 IMPLANT
NS IRRIG 1000ML POUR BTL (IV SOLUTION) ×3 IMPLANT
PACK ARTHRO LIMB DRAPE STRL (MISCELLANEOUS) ×3 IMPLANT
PAD ABD 5X9 TENDERSORB (GAUZE/BANDAGES/DRESSINGS) ×3 IMPLANT
PAD ARMBOARD 7.5X6 YLW CONV (MISCELLANEOUS) ×3 IMPLANT
PADDING CAST COTTON 6X4 STRL (CAST SUPPLIES) ×3 IMPLANT
SET ARTHROSCOPY INST (INSTRUMENTS) ×3 IMPLANT
SET ARTHROSCOPY PUMP TUBE (IRRIGATION / IRRIGATOR) ×3 IMPLANT
SET BASIN LINEN APH (SET/KITS/TRAYS/PACK) ×3 IMPLANT
SUT ETHILON 3 0 FSL (SUTURE) ×3 IMPLANT
SYR 30ML LL (SYRINGE) ×3 IMPLANT
SYRINGE 10CC LL (SYRINGE) ×3 IMPLANT
WAND 50 DEG COVAC W/CORD (SURGICAL WAND) ×3 IMPLANT
YANKAUER SUCT BULB TIP 10FT TU (MISCELLANEOUS) ×9 IMPLANT

## 2015-04-28 NOTE — Anesthesia Preprocedure Evaluation (Signed)
Anesthesia Evaluation  Patient identified by MRN, date of birth, ID band Patient awake    Reviewed: Allergy & Precautions, NPO status , Patient's Chart, lab work & pertinent test results  Airway Mallampati: II  TM Distance: >3 FB     Dental  (+) Partial Lower, Partial Upper, Dental Advisory Given   Pulmonary neg pulmonary ROS,    breath sounds clear to auscultation       Cardiovascular hypertension, Pt. on medications  Rhythm:Regular Rate:Normal     Neuro/Psych    GI/Hepatic GERD  Medicated and Controlled,  Endo/Other  diabetes (borderline)  Renal/GU      Musculoskeletal  (+) Arthritis ,   Abdominal   Peds  Hematology   Anesthesia Other Findings   Reproductive/Obstetrics                             Anesthesia Physical Anesthesia Plan  ASA: II  Anesthesia Plan: General   Post-op Pain Management:    Induction: Intravenous  Airway Management Planned: LMA  Additional Equipment:   Intra-op Plan:   Post-operative Plan: Extubation in OR  Informed Consent: I have reviewed the patients History and Physical, chart, labs and discussed the procedure including the risks, benefits and alternatives for the proposed anesthesia with the patient or authorized representative who has indicated his/her understanding and acceptance.     Plan Discussed with:   Anesthesia Plan Comments:         Anesthesia Quick Evaluation

## 2015-04-28 NOTE — Interval H&P Note (Signed)
History and Physical Interval Note:  04/28/2015 8:40 AM  Shannon Gallegos  has presented today for surgery, with the diagnosis of left knee meniscal tear  The various methods of treatment have been discussed with the patient and family. After consideration of risks, benefits and other options for treatment, the patient has consented to  Procedure(s): LEFT KNEE ARTHROSCOPY WITH MENISECTOMY (Left) as a surgical intervention .  The patient's history has been reviewed, patient examined, no change in status, stable for surgery.  I have reviewed the patient's chart and labs.  Questions were answered to the patient's satisfaction.     Arther Abbott

## 2015-04-28 NOTE — Anesthesia Procedure Notes (Signed)
Procedure Name: LMA Insertion Date/Time: 04/28/2015 9:06 AM Performed by: Andree Elk, Roscoe Witts A Pre-anesthesia Checklist: Patient identified, Timeout performed, Emergency Drugs available, Suction available and Patient being monitored Patient Re-evaluated:Patient Re-evaluated prior to inductionOxygen Delivery Method: Circle system utilized Preoxygenation: Pre-oxygenation with 100% oxygen Intubation Type: IV induction Ventilation: Mask ventilation without difficulty LMA: LMA inserted LMA Size: 4.0 Number of attempts: 1 Placement Confirmation: positive ETCO2 Tube secured with: Tape Dental Injury: Teeth and Oropharynx as per pre-operative assessment

## 2015-04-28 NOTE — Addendum Note (Signed)
Addendum  created 04/28/15 1054 by Vista Deck, CRNA   Modules edited: Notes Section   Notes Section:  File: [id number]

## 2015-04-28 NOTE — Discharge Instructions (Signed)
Your knee had mild arthritis overall and  a new tear in the medial meniscus. You will not need knee replacement surgery for several years. We will treat youmedically for your knee arthritis.  Friday changed the bandages and applied 2 Band-Aids and reapply the Ace wrap loosely Use the Cryo/Cuff until you're seen in the office. Leave it on for 30 minutes every 2 hours while you are awake you do not have to use it while you're sleeping and you should take it off when you're walking  You have a walker you can apply as much weight on the leg that was operated on as you can stand. You are encouraged to apply as much is possible he can as it was agreed recovery.  On Saturday start bending your knee 253 times a day  Any questions please call [phone] or call the hospital and they will get in contact with me

## 2015-04-28 NOTE — Brief Op Note (Addendum)
04/28/2015  10:01 AM  PATIENT:  [name]  65 y.o. female  PRE-OPERATIVE DIAGNOSIS:  left knee meniscal tear  POST-OPERATIVE DIAGNOSIS:  left knee medial meniscal tear; osteoarthritis left knee  PROCEDURE:  Procedure(s): LEFT KNEE ARTHROSCOPY WITH PARTIAL MEDIAL MENISECTOMY (Left)   Operative findings  Medial compartment previous meniscectomy with small partial tear of the posterior horn. Grade 2 arthritis. Notch area anterior cruciate ligament PCL intact with degeneration of the anterior cruciate ligament but no tear Lateral compartment free edge degeneration lateral meniscus grade 1-2 arthritis Patellar compartment grade 2 chondromalacia medial facet grade 4 chondral loss of the lateral trochlea and femur  SURGEON:  Surgeon(s) and Role:    * Carole Civil, MD - Primary  PHYSICIAN ASSISTANT:   ASSISTANTS: none   ANESTHESIA:   general  EBL:  Total I/O In: 500 [I.V.:500] Out: 5 [Blood:5]  BLOOD ADMINISTERED:none  DRAINS: none   LOCAL MEDICATIONS USED:  MARCAINE     SPECIMEN:  No Specimen  DISPOSITION OF SPECIMEN:  N/A  COUNTS:  YES  TOURNIQUET:    DICTATION: .Dragon Dictation  PLAN OF CARE: Discharge to home after PACU  PATIENT DISPOSITION:  PACU - hemodynamically stable.   Delay start of Pharmacological VTE agent (>24hrs) due to surgical blood loss or risk of bleeding: yes

## 2015-04-28 NOTE — Anesthesia Postprocedure Evaluation (Signed)
Anesthesia Post Note  Patient: Shannon Gallegos  Procedure(s) Performed: Procedure(s) (LRB): LEFT KNEE ARTHROSCOPY WITH PARTIAL MEDIAL MENISECTOMY (Left)  Patient location during evaluation: PACU Anesthesia Type: General Level of consciousness: awake and oriented Pain management: pain level controlled Vital Signs Assessment: post-procedure vital signs reviewed and stable Respiratory status: spontaneous breathing and respiratory function stable Cardiovascular status: stable Postop Assessment: no signs of nausea or vomiting Anesthetic complications: no    Last Vitals:  Filed Vitals:   04/28/15 1008 04/28/15 1015  BP: 115/87 112/84  Pulse: 79 73  Temp: 36.7 C   Resp: 11 11    Last Pain: There were no vitals filed for this visit.               Theresa Wedel A

## 2015-04-28 NOTE — Transfer of Care (Signed)
Immediate Anesthesia Transfer of Care Note  Patient: Shannon Gallegos  Procedure(s) Performed: Procedure(s): LEFT KNEE ARTHROSCOPY WITH PARTIAL MEDIAL MENISECTOMY (Left)  Patient Location: PACU  Anesthesia Type:General  Level of Consciousness: awake, oriented and patient cooperative  Airway & Oxygen Therapy: Patient Spontanous Breathing and Patient connected to face mask oxygen  Post-op Assessment: Report given to RN and Post -op Vital signs reviewed and stable  Post vital signs: Reviewed and stable  Last Vitals:  Filed Vitals:   04/28/15 0850 04/28/15 0855  BP: 110/75 115/74  Pulse:    Temp:    Resp: 15 31    Complications: No apparent anesthesia complications

## 2015-04-28 NOTE — Anesthesia Postprocedure Evaluation (Signed)
Anesthesia Post Note  Patient: Shannon Gallegos  Procedure(s) Performed: Procedure(s) (LRB): LEFT KNEE ARTHROSCOPY WITH PARTIAL MEDIAL MENISECTOMY (Left)  Patient location during evaluation: PACU Anesthesia Type: General Level of consciousness: awake and alert Pain management: pain level controlled Vital Signs Assessment: post-procedure vital signs reviewed and stable Respiratory status: spontaneous breathing Cardiovascular status: stable Anesthetic complications: no    Last Vitals:  Filed Vitals:   04/28/15 1030 04/28/15 1045  BP: 115/79 106/75  Pulse: 60 55  Temp:    Resp: 11 16    Last Pain: There were no vitals filed for this visit.               Drucie Opitz

## 2015-04-28 NOTE — Op Note (Signed)
Surgical procedure dictation for 04/28/2015  Primary indication for procedure left knee pain unresponsive to nonoperative treatment  Preop diagnosis medial meniscal tear left knee  Postoperative diagnosis medial meniscus tear left knee with osteoarthritis  Procedure arthroscopy partial medial meniscectomy  Surgeon Aline Brochure  Anesthesia Gen.  No assistants  Surgical findings Operative findings  Medial compartment previous meniscectomy with small partial tear of the posterior horn. Grade 2 arthritis. Notch area anterior cruciate ligament PCL intact with degeneration of the anterior cruciate ligament but no tear Lateral compartment free edge degeneration lateral meniscus grade 1-2 arthritis Patellar compartment grade 2 chondromalacia medial facet grade 4 chondral loss of the lateral trochlea and femur   Details of procedure  First we identified the patient in the preop area we can from the left knee as a surgical site marked as such. Chart review was completed.  The patient was taken to the operating room for general anesthesia in supine position. After successful anesthesia the left leg was placed in arthroscopic leg holder the right leg was padded and sterile prep and drape was done on left lower extremity  Timeout completed  Portal was established laterally scope was placed in the joint and a diagnostic arthroscopy was performed findings are noted above  Overall the patient had mild arthritis of the knee she had a previous meniscectomy and a new tear in the medial meniscus  Through a medial portal a 50 ArthroCare wand was used to trim the posterior horn the medial meniscus and debride the notch area of some degenerative anterior cruciate ligament tissue. We used a straight shaver to perform a chondroplasty of the medial facet of the patella and generalized debridement  The knee was irrigated copious amounts of saline and thoroughly. We injected 60 mL of Marcaine with  epinephrine into the joint and closed the portals with 3-0 nylon suture  We applied a sterile dressing and Cryo/Cuff and she was extubated and taken to recovery room in stable condition  Postop plan is for full weightbearing as tolerated followed by physical therapy for range of motion and strengthening exercises  The patient does not need knee replacement at this time. She should be treated medically for her knee pain and further sources of pain for her or lumbar as well as just generalized chronic pain.

## 2015-04-29 ENCOUNTER — Encounter (HOSPITAL_COMMUNITY): Payer: Self-pay | Admitting: Orthopedic Surgery

## 2015-05-02 ENCOUNTER — Encounter: Payer: Self-pay | Admitting: Orthopedic Surgery

## 2015-05-02 ENCOUNTER — Ambulatory Visit (INDEPENDENT_AMBULATORY_CARE_PROVIDER_SITE_OTHER): Payer: Self-pay | Admitting: Orthopedic Surgery

## 2015-05-02 VITALS — BP 147/91 | Ht 62.0 in | Wt 214.0 lb

## 2015-05-02 DIAGNOSIS — Z9889 Other specified postprocedural states: Secondary | ICD-10-CM

## 2015-05-02 NOTE — Patient Instructions (Signed)
Call [facility] therapy dept to schedule therapy visits 

## 2015-05-02 NOTE — Progress Notes (Signed)
Patient ID: Shannon Gallegos, female   DOB: May 12, 1950, 65 y.o.   MRN: [id number]   POST OP VISIT  S/P KNEE ARTHROSCOPY  Chief Complaint  Patient presents with  . Follow-up    post op 1, SALK PARTIAL MM,  DOS 2/2/7     BP 147/91 mmHg  Ht 5\' 2"  (1.575 m)  Wt 214 lb (97.07 kg)  BMI 39.13 kg/m2  The surgical sites look clean dry and intact.    ASSESSMENT AND PLAN   Start physical therapy and arrange follow-up visit

## 2015-05-07 NOTE — Progress Notes (Signed)
REVIEWED-NO ADDITIONAL RECOMMENDATIONS. 

## 2015-05-18 ENCOUNTER — Ambulatory Visit (HOSPITAL_COMMUNITY): Payer: No Typology Code available for payment source | Attending: Orthopedic Surgery | Admitting: Physical Therapy

## 2015-05-18 DIAGNOSIS — M6281 Muscle weakness (generalized): Secondary | ICD-10-CM

## 2015-05-18 DIAGNOSIS — M25662 Stiffness of left knee, not elsewhere classified: Secondary | ICD-10-CM

## 2015-05-18 DIAGNOSIS — Z9889 Other specified postprocedural states: Secondary | ICD-10-CM

## 2015-05-18 DIAGNOSIS — R262 Difficulty in walking, not elsewhere classified: Secondary | ICD-10-CM

## 2015-05-18 DIAGNOSIS — R600 Localized edema: Secondary | ICD-10-CM

## 2015-05-18 DIAGNOSIS — Z7409 Other reduced mobility: Secondary | ICD-10-CM

## 2015-05-18 NOTE — Therapy (Signed)
[facility] [address] [place], [place], [address] Phone: [phone]   Fax:  [phone]  Physical Therapy Evaluation  Patient Details  Name: Shannon Gallegos MRN: [id number] Date of Birth: November 26, 1950 Referring Provider: Arther Abbott   Encounter Date: 05/18/2015      PT End of Session - 05/18/15 1035    Visit Number 1   Number of Visits 18   Date for PT Re-Evaluation 06/15/15   Authorization Type GCCN 100%    Authorization Time Period 05/18/15 to 06/29/15   PT Start Time 0849   PT Stop Time 0931   PT Time Calculation (min) 42 min   Activity Tolerance Patient tolerated treatment well   Behavior During Therapy [facility] for tasks assessed/performed      Past Medical History  Diagnosis Date  . Borderline diabetes   . Hypertension   . Hypercholesterolemia   . GERD (gastroesophageal reflux disease)   . Vertigo   . Arthritis     Past Surgical History  Procedure Laterality Date  . Knee surgery Bilateral     arthroscopies  . Tubal ligation    . Colonoscopy  March 2014    Dr. Ellender Hose: sigmoid and descending colon diverticula, ADENOMATOUS polyp. Due for surveillance 2019  . Esophagogastroduodenoscopy (egd) with esophageal dilation N/A 12/09/2012    Dr. Oneida Alar: small hiatal hernia, Schatzki's ring at GE junction s/p Savary dilation, benign sessile polyps, moderate gastritis  . Cataract extraction Bilateral   . Knee arthroscopy with medial menisectomy Left 04/28/2015    Procedure: LEFT KNEE ARTHROSCOPY WITH PARTIAL MEDIAL MENISECTOMY;  Surgeon: Carole Civil, MD;  Location: [facility];  Service: Orthopedics;  Laterality: Left;    There were no vitals filed for this visit.  Visit Diagnosis:  S/P left knee arthroscopy - Plan: PT plan of care cert/re-cert  Knee stiffness, left - Plan: PT plan of care cert/re-cert  Localized edema - Plan: PT plan of care cert/re-cert  Muscle weakness - Plan: PT plan of care cert/re-cert  Difficulty  walking - Plan: PT plan of care cert/re-cert  Impaired functional mobility and activity tolerance - Plan: PT plan of care cert/re-cert      Subjective Assessment - 05/18/15 0851    Subjective Patient had L knee arthroscopy on 04/28/15 with Dr. Aline Brochure; she has been doing some exercises on her own- bending it mostly- but it is quite difficult for her to go up stairs right now. Feels quite weak in general. Has been using ice on it as well. States that the knee feels like it tries to give sometimes when she is walking. She does report that her R knee has been giving her problems too and she may have surgery on it in the future; she has also been icing her R knee as well. Almost had a fall yesterday; she was walking out of a building and stepping onto a lower spot but was able to catch herself, does report she notices she gets off balance sometimes.    Pertinent History R knee pain, GERD, hx of vertigo, borderline diabetes, back pain    How long can you sit comfortably? 2/22- 10 minutes in regular chair    How long can you stand comfortably? 2/22- immediate discomfort    How long can you walk comfortably? 2/22- uses walking stick, maybe house hold distances    Patient Stated Goals be able to walk without any device for extended distances, get back to PLOF    Currently in  Pain? No/denies            [facility] PT Assessment - 05/18/15 0001    Assessment   Medical Diagnosis L knee arthroscopy    Referring Provider Arther Abbott    Onset Date/Surgical Date 04/28/15   Next MD Visit March 6th with Dr. Aline Brochure    Precautions   Precautions None   Restrictions   Weight Bearing Restrictions No   Balance Screen   Has the patient fallen in the past 6 months No   Has the patient had a decrease in activity level because of a fear of falling?  No   Is the patient reluctant to leave their home because of a fear of falling?  No   Prior Function   Level of Independence Independent   Vocation Retired    Leisure go to [employer] sports events   Observation/Other Assessments   Focus on Therapeutic Outcomes (FOTO)  58% limited    AROM   Left Knee Extension 4   Left Knee Flexion 114   Strength   Right Hip Flexion 2+/5   Right Hip Extension 2/5   Right Hip ABduction 3-/5   Left Hip Flexion 3/5   Left Hip Extension 2/5   Left Hip ABduction 2+/5   Right Knee Flexion 3/5   Right Knee Extension 2+/5  pain limited    Left Knee Flexion 3/5   Left Knee Extension 3-/5   Right Ankle Dorsiflexion 4-/5   Left Ankle Dorsiflexion 3/5   Transfers   Five time sit to stand comments  77.34 seconds with B UEs    Ambulation/Gait   Gait Comments reduced gait speed, reduced stance time L, reduced L TKE, lacking heel-toe pattern    6 minute walk test results    Aerobic Endurance Distance Walked 452   Endurance additional comments 3MWT no device    High Level Balance   High Level Balance Comments TUG 14.15 no device                            PT Education - 05/18/15 1035    Education provided Yes   Education Details prognosis, plan of care, HEP    Person(s) Educated Patient   Methods Explanation;Handout   Comprehension Verbalized understanding;Returned demonstration;Need further instruction          PT Short Term Goals - 05/18/15 1041    PT SHORT TERM GOAL #1   Title Patient will demonstrate L knee range 0-120 to reduce pain and enhance overall mobility and gait mechanics    Time 3   Period Weeks   Status New   PT SHORT TERM GOAL #2   Title Patient will be able to ambulate for at least 15-20 minutes with equal step lengths/stance time, full L knee TKE, minimal unsteadiness, and pain no more than 2/10 in order to assist in improving overall mobiltiy    Time 3   Period Weeks   Status New   PT SHORT TERM GOAL #3   Title Patient will be able to complete 5x sit to stand in 40 seconds or less, and TUG in 10 seconds or less without assistive device in order to demonstrate  improving mobility and balance    Time 3   Period Weeks   Status New   PT SHORT TERM GOAL #4   Title Patient to be independent in correctly and consistently performing appropriate HEP, to be updated PRN  Time 3   Period Weeks   Status New           PT Long Term Goals - 05/18/15 1043    PT LONG TERM GOAL #1   Title Patient will demonstrate strength 4/5 in all tested muscle groups in order to reduce pain, enhance mobility, and improve performance on stairs    Time 6   Period Weeks   Status New   PT LONG TERM GOAL #2   Title Patient will be able to complete 6 minute walk test and will ambulate approximately 1026ft with no more than 1 rest break and pain no more than 2/10 in order to demonstrate improved mobility    Time 6   Period Weeks   Status New   PT LONG TERM GOAL #3   Title Patient to report that she has been able to attend her grandchildren's sports events with L knee pain no more than 2/10 in order to assist in returning to PLOF    Time 6   Period Weeks   Status New   PT LONG TERM GOAL #4   Title Patient to be able to reciprocally ascend/descend at least 4 standard height steps with one railing, minimal unsteadiness, and good eccentric control in order to reduce fall risk and improve mobilty at home and in community    Time [place] - 05/18/15 1037    Clinical Impression Statement Patient presents approximately 3 weeks status post L knee arthroscopy; she reports that she has been feeling better, no falls, but still has a hard time getting up from chairs, she has also been trying to do some limited exercises at home but not a lot as she was not sure what to do. Upon examination, patient demonstrates significant muscle weakness, impaired gait mechanics and speed, unsteadiness, difficulty with functional transfers due to lacking muscle power, L knee stiffness, and localized edema. Incisions appear to be healing well with one  completely healed and other scabbed but no signs of inflammation or infection; will continue to monitor. At this point patient will benefit from skilled PT services to address functional limitations and assist in reaching optimal level of function.    Pt will benefit from skilled therapeutic intervention in order to improve on the following deficits Abnormal gait;Hypomobility;Decreased activity tolerance;Decreased strength;Pain;Decreased balance;Decreased mobility;Difficulty walking;Improper body mechanics;Decreased coordination;Impaired flexibility   Rehab Potential Good   Clinical Impairments Affecting Rehab Potential may be limited by pre-existing back pain and R knee pain    PT Frequency 3x / week   PT Duration 6 weeks   PT Treatment/Interventions ADLs/Self Care Home Management;Cryotherapy;Electrical Stimulation;Gait training;Stair training;Functional mobility training;Therapeutic activities;Therapeutic exercise;Balance training;Patient/family education;Manual techniques;Passive range of motion;DME Instruction;Neuromuscular re-education;Taping   PT Next Visit Plan review HEP and goals; functional stretching and gait training, balance training; strengthening as appropriate   PT Home Exercise Plan given   Consulted and Agree with Plan of Care Patient         Problem List Patient Active Problem List   Diagnosis Date Noted  . Acute medial meniscus tear of left knee   . Arthritis of knee   . Right flank discomfort 06/04/2013  . Dyspepsia 04/23/2013  . Dry mouth 04/23/2013  . GERD (gastroesophageal reflux disease) 11/20/2012  . Dysphagia, unspecified(787.20) 11/20/2012  . Adenomatous polyp 11/20/2012    Deniece Ree PT, DPT [phone]  [place]  [facility] [address] [place], [place], [address] Phone: [phone]   Fax:  [phone]  Name: Shannon Gallegos MRN: [id number] Date of Birth: May 07, 1950

## 2015-05-18 NOTE — Patient Instructions (Signed)
  QUAD SET WITH TOWEL UNDER HEEL  While lying or sitting with a small towel roll under your ankle, tighten your top thigh muscle to press the back of your knee downward towards the ground. Hold for 3 seconds and relax.   Repeat 10-15 times, 2-3 times per day.    STANDING HAMSTRING CURLS  While standing, bend your knee so that your heel moves towards your buttock. Hold for 2 seconds and relax. You can do this in sitting or standing.  Repeat 10-15 times, twice a day.     STANDING HEEL RAISES  While standing, raise up on your toes as you lift your heels off the ground. Repeat 15 times, twice a day.    TOES RAISES - DORSIFLEXION STANDING  In a standing position with your feet on the ground, raise up your forefoot and toes as you bend at your ankle. Make sure you are not sticking your butt out.  Repeat 15 times, twice a day.    WEIGHT SHIFT - LATERAL  While in a standing position and knees partially bent, slowly shift your body weight side-to-side.   Repeat 20 times, twice a day.

## 2015-05-19 ENCOUNTER — Ambulatory Visit (HOSPITAL_COMMUNITY): Payer: No Typology Code available for payment source

## 2015-05-19 DIAGNOSIS — R600 Localized edema: Secondary | ICD-10-CM

## 2015-05-19 DIAGNOSIS — M25662 Stiffness of left knee, not elsewhere classified: Secondary | ICD-10-CM

## 2015-05-19 DIAGNOSIS — M6281 Muscle weakness (generalized): Secondary | ICD-10-CM

## 2015-05-19 DIAGNOSIS — R262 Difficulty in walking, not elsewhere classified: Secondary | ICD-10-CM

## 2015-05-19 DIAGNOSIS — Z9889 Other specified postprocedural states: Secondary | ICD-10-CM

## 2015-05-19 DIAGNOSIS — Z7409 Other reduced mobility: Secondary | ICD-10-CM

## 2015-05-19 NOTE — Therapy (Signed)
[place], [place], [address] Phone: [phone]   Fax:  [phone]  Physical Therapy Treatment  Patient Details  Name: Shannon Gallegos MRN: [id number] Date of Birth: 1950/05/22 Referring Provider: Arther Abbott   Encounter Date: 05/19/2015      PT End of Session - 05/19/15 0949    Visit Number 2   Number of Visits 18   Date for PT Re-Evaluation 06/15/15   Authorization Type GCCN 100%    Authorization Time Period 05/18/15 to 06/29/15   PT Start Time 0935   PT Stop Time 1015   PT Time Calculation (min) 40 min   Activity Tolerance Patient tolerated treatment well   Behavior During Therapy [facility] for tasks assessed/performed      Past Medical History  Diagnosis Date  . Borderline diabetes   . Hypertension   . Hypercholesterolemia   . GERD (gastroesophageal reflux disease)   . Vertigo   . Arthritis     Past Surgical History  Procedure Laterality Date  . Knee surgery Bilateral     arthroscopies  . Tubal ligation    . Colonoscopy  March 2014    Dr. Ellender Hose: sigmoid and descending colon diverticula, ADENOMATOUS polyp. Due for surveillance 2019  . Esophagogastroduodenoscopy (egd) with esophageal dilation N/A 12/09/2012    Dr. Oneida Alar: small hiatal hernia, Schatzki's ring at GE junction s/p Savary dilation, benign sessile polyps, moderate gastritis  . Cataract extraction Bilateral   . Knee arthroscopy with medial menisectomy Left 04/28/2015    Procedure: LEFT KNEE ARTHROSCOPY WITH PARTIAL MEDIAL MENISECTOMY;  Surgeon: Carole Civil, MD;  Location: [facility];  Service: Orthopedics;  Laterality: Left;    There were no vitals filed for this visit.  Visit Diagnosis:  S/P left knee arthroscopy  Knee stiffness, left  Localized edema  Muscle weakness  Difficulty walking  Impaired functional mobility and activity tolerance      Subjective Assessment - 05/19/15 0937    Subjective Pt noted that her L knee  is minimally sore today, which is "nothing to complain about". No new complaints or changes reported upon arrival.    Pertinent History R knee pain, GERD, hx of vertigo, borderline diabetes, back pain    Patient Stated Goals be able to walk without any device for extended distances, get back to PLOF    Currently in Pain? Yes   Pain Score 1    Pain Location Knee   Pain Orientation Left   Pain Descriptors / Indicators Aching   Pain Type Surgical pain   Pain Radiating Towards None    Pain Onset 1 to 4 weeks ago   Pain Frequency Intermittent   Aggravating Factors  long distance ambulation and stair climbing    Pain Relieving Factors rest    Effect of Pain on Daily Activities limited with stair climbing    Pain Radiating Towards none                          [facility] Adult PT Treatment/Exercise - 05/19/15 0001    Knee/Hip Exercises: Stretches       Passive Hamstring Stretch Left;4 reps;30 seconds   Knee/Hip Exercises: Standing   Heel Raises Both;10 reps;2 sets;Other (comment)  Toe raises completed with same reps and sets as stated above   Heel Raises Limitations with UE support    Hip Flexion Stengthening;Both;1 set;15 reps  alternating marching with UE support    Other  Standing Knee Exercises A<>P and M<>L weight shifting x 2 sets of 1 minute    Knee/Hip Exercises: Supine   Quad Sets Left;2 sets;Strengthening;10 reps   Quad Sets Limitations 3" hold    Short Arc Quad Sets Strengthening;Left;2 sets;10 reps;Other (comment);AAROM  with manual assist and tactile cues to L quad    Heel Slides Left;2 sets;10 reps;AAROM   Manual Therapy   Manual Therapy Passive ROM;Joint mobilization   Joint Mobilization L patellar mobs using grades I-III to decrease pain and improve mobility   Passive ROM L knee PROM into extension and flexion in supine position                 PT Education - 05/19/15 0948    Education provided Yes   Education Details Educated pt on PT eval  findings, initial HEP, and pain management strategies including ice packs x 15-20 min, 3-4x/day    Person(s) Educated Patient   Methods Explanation   Comprehension Verbalized understanding;Returned demonstration          PT Short Term Goals - 05/18/15 1041    PT SHORT TERM GOAL #1   Title Patient will demonstrate L knee range 0-120 to reduce pain and enhance overall mobility and gait mechanics    Time 3   Period Weeks   Status New   PT SHORT TERM GOAL #2   Title Patient will be able to ambulate for at least 15-20 minutes with equal step lengths/stance time, full L knee TKE, minimal unsteadiness, and pain no more than 2/10 in order to assist in improving overall mobiltiy    Time 3   Period Weeks   Status New   PT SHORT TERM GOAL #3   Title Patient will be able to complete 5x sit to stand in 40 seconds or less, and TUG in 10 seconds or less without assistive device in order to demonstrate improving mobility and balance    Time 3   Period Weeks   Status New   PT SHORT TERM GOAL #4   Title Patient to be independent in correctly and consistently performing appropriate HEP, to be updated PRN    Time 3   Period Weeks   Status New           PT Long Term Goals - 05/18/15 1043    PT LONG TERM GOAL #1   Title Patient will demonstrate strength 4/5 in all tested muscle groups in order to reduce pain, enhance mobility, and improve performance on stairs    Time 6   Period Weeks   Status New   PT LONG TERM GOAL #2   Title Patient will be able to complete 6 minute walk test and will ambulate approximately 103ft with no more than 1 rest break and pain no more than 2/10 in order to demonstrate improved mobility    Time 6   Period Weeks   Status New   PT LONG TERM GOAL #3   Title Patient to report that she has been able to attend her grandchildren's sports events with L knee pain no more than 2/10 in order to assist in returning to PLOF    Time 6   Period Weeks   Status New   PT LONG  TERM GOAL #4   Title Patient to be able to reciprocally ascend/descend at least 4 standard height steps with one railing, minimal unsteadiness, and good eccentric control in order to reduce fall risk and improve mobilty at home and in  community    Time 6   Period Weeks   Status New               Plan - 05/19/15 [id number]    Clinical Impression Statement PT tx focused on supine L knee AAROM ther ex, L quad/HS strengthening, stretches, and manual therapy techniques to improve patellar mobility and reduce pain. Fair to good tolerance reported with added ther ex. Pt presented with poor L quad activation during SAQ. Manual assist required with SAQ with improved quad activation assessed with subsequent reps and after tactile cues provided. Pt was unable to complete L SLR without assistance. SLR held this visit. Good tolerance reported with added manual therapy techniques with improved patellar mobility palpated. L knee pain rated a 0/10 on a VAS at the completion of PT visit. Reviewed HEP with good understanding verbalized and demo. Continue with current POC.    Pt will benefit from skilled therapeutic intervention in order to improve on the following deficits Abnormal gait;Hypomobility;Decreased activity tolerance;Decreased strength;Pain;Decreased balance;Decreased mobility;Difficulty walking;Improper body mechanics;Decreased coordination;Impaired flexibility   Rehab Potential Good   Clinical Impairments Affecting Rehab Potential may be limited by pre-existing back pain and R knee pain    PT Frequency 3x / week   PT Duration 6 weeks   PT Treatment/Interventions ADLs/Self Care Home Management;Cryotherapy;Electrical Stimulation;Gait training;Stair training;Functional mobility training;Therapeutic activities;Therapeutic exercise;Balance training;Patient/family education;Manual techniques;Passive range of motion;DME Instruction;Neuromuscular re-education;Taping   PT Next Visit Plan Next visit to focus on  stretching, L knee ROM ther ex, balance training, and quad/HS strengthening as appropriate   PT Home Exercise Plan Reviewed initial HEP    Consulted and Agree with Plan of Care Patient        Problem List Patient Active Problem List   Diagnosis Date Noted  . Acute medial meniscus tear of left knee   . Arthritis of knee   . Right flank discomfort 06/04/2013  . Dyspepsia 04/23/2013  . Dry mouth 04/23/2013  . GERD (gastroesophageal reflux disease) 11/20/2012  . Dysphagia, unspecified(787.20) 11/20/2012  . Adenomatous polyp 11/20/2012    Garen Lah, PT, DPT   05/19/2015, 10:15 AM  [place], [place], [address] Phone: [phone]   Fax:  [phone]  Name: Shannon Gallegos MRN: [id number] Date of Birth: [birth year]

## 2015-05-25 ENCOUNTER — Encounter (HOSPITAL_COMMUNITY): Payer: No Typology Code available for payment source | Admitting: Physical Therapy

## 2015-05-27 ENCOUNTER — Encounter (HOSPITAL_COMMUNITY): Payer: No Typology Code available for payment source | Admitting: Physical Therapy

## 2015-05-30 ENCOUNTER — Ambulatory Visit: Payer: Self-pay | Admitting: Orthopedic Surgery

## 2015-05-30 ENCOUNTER — Ambulatory Visit (HOSPITAL_COMMUNITY): Payer: No Typology Code available for payment source | Admitting: Physical Therapy

## 2015-06-01 ENCOUNTER — Ambulatory Visit (HOSPITAL_COMMUNITY): Payer: No Typology Code available for payment source | Attending: Orthopedic Surgery | Admitting: Physical Therapy

## 2015-06-01 DIAGNOSIS — M25662 Stiffness of left knee, not elsewhere classified: Secondary | ICD-10-CM

## 2015-06-01 DIAGNOSIS — Z9889 Other specified postprocedural states: Secondary | ICD-10-CM

## 2015-06-01 DIAGNOSIS — M25659 Stiffness of unspecified hip, not elsewhere classified: Secondary | ICD-10-CM | POA: Insufficient documentation

## 2015-06-01 DIAGNOSIS — Z7409 Other reduced mobility: Secondary | ICD-10-CM | POA: Insufficient documentation

## 2015-06-01 DIAGNOSIS — M5441 Lumbago with sciatica, right side: Secondary | ICD-10-CM

## 2015-06-01 DIAGNOSIS — M6281 Muscle weakness (generalized): Secondary | ICD-10-CM | POA: Insufficient documentation

## 2015-06-01 DIAGNOSIS — R262 Difficulty in walking, not elsewhere classified: Secondary | ICD-10-CM | POA: Insufficient documentation

## 2015-06-01 DIAGNOSIS — M25562 Pain in left knee: Secondary | ICD-10-CM | POA: Insufficient documentation

## 2015-06-01 DIAGNOSIS — R600 Localized edema: Secondary | ICD-10-CM | POA: Insufficient documentation

## 2015-06-01 DIAGNOSIS — R6889 Other general symptoms and signs: Secondary | ICD-10-CM | POA: Insufficient documentation

## 2015-06-01 DIAGNOSIS — M25561 Pain in right knee: Secondary | ICD-10-CM | POA: Insufficient documentation

## 2015-06-01 NOTE — Therapy (Signed)
[place], [place], [address] Phone: [phone]   Fax:  [phone]  Physical Therapy Treatment  Patient Details  Name: Shannon Gallegos MRN: [id number] Date of Birth: July 22, 1950 Referring Provider: Arther Abbott   Encounter Date: 06/01/2015      PT End of Session - 06/01/15 1114    Visit Number 3   Number of Visits 18   Date for PT Re-Evaluation 06/15/15   Authorization Type GCCN 100%    Authorization Time Period 05/18/15 to 06/29/15   PT Start Time 1017   PT Stop Time 1101   PT Time Calculation (min) 44 min   Activity Tolerance Patient tolerated treatment well   Behavior During Therapy [facility] for tasks assessed/performed      Past Medical History  Diagnosis Date  . Borderline diabetes   . Hypertension   . Hypercholesterolemia   . GERD (gastroesophageal reflux disease)   . Vertigo   . Arthritis     Past Surgical History  Procedure Laterality Date  . Knee surgery Bilateral     arthroscopies  . Tubal ligation    . Colonoscopy  March 2014    Dr. Ellender Hose: sigmoid and descending colon diverticula, ADENOMATOUS polyp. Due for surveillance 2019  . Esophagogastroduodenoscopy (egd) with esophageal dilation N/A 12/09/2012    Dr. Oneida Alar: small hiatal hernia, Schatzki's ring at GE junction s/p Savary dilation, benign sessile polyps, moderate gastritis  . Cataract extraction Bilateral   . Knee arthroscopy with medial menisectomy Left 04/28/2015    Procedure: LEFT KNEE ARTHROSCOPY WITH PARTIAL MEDIAL MENISECTOMY;  Surgeon: Carole Civil, MD;  Location: [facility];  Service: Orthopedics;  Laterality: Left;    There were no vitals filed for this visit.  Visit Diagnosis:  S/P left knee arthroscopy  Knee stiffness, left  Localized edema  Muscle weakness  Difficulty walking  Impaired functional mobility and activity tolerance  Midline low back pain with right-sided sciatica  Bilateral knee pain  Hip  stiffness, unspecified laterality  Decreased functional activity tolerance      Subjective Assessment - 06/01/15 1020    Subjective Pt notes she is doing good today. She is getting over the flu. reports she has been doing her HEP at home. reports 4/10 pain when walking or during other acitivity, however at this moment she is not experiencing any pain.    Pertinent History R knee pain, GERD, hx of vertigo, borderline diabetes, back pain    Currently in Pain? Yes   Pain Score 4   0/10 while standing but increased to a 4/10 when walking    Pain Location Knee   Pain Orientation Left   Pain Descriptors / Indicators Aching   Pain Type Surgical pain   Pain Radiating Towards L quad   Pain Onset More than a month ago   Pain Frequency Intermittent   Aggravating Factors  walking long distances, stairs and getting out of a chair   Pain Relieving Factors rest   Effect of Pain on Daily Activities limited stairs and community distance walking    Multiple Pain Sites Yes  Notes chronic back pain, unrelated to her L knee pain                         [facility] Adult PT Treatment/Exercise - 06/01/15 0001    Lumbar Exercises: Standing   Heel Raises 20 reps   Knee/Hip Exercises: Seated   Sit to Sand 1 set;10  reps;with UE support  reviewing correct technique   Knee/Hip Exercises: Supine   Quad Sets Left;2 sets;Strengthening;10 reps   Quad Sets Limitations 5 sec hold with towel under heel   Short Arc Quad Sets AAROM;Left;1 set;10 reps  Mod to MaxA   Manual Therapy   Manual Therapy Soft tissue mobilization;Joint mobilization   Manual therapy comments grade I-III tibiofemoral jt mobs   Joint Mobilization L patellar mobs using grades I-III to decrease pain and improve mobility   Soft tissue mobilization L quadriceps, trigger point release                PT Education - 06/01/15 1112    Education provided Yes   Education Details Reviewed HEP and encouraged HEP adherence at  home. Discussed and demonstrated proper sit/stand technique. Discussed self massage to L quad for relaxation   Person(s) Educated Patient   Methods Explanation;Demonstration   Comprehension Verbalized understanding;Verbal cues required          PT Short Term Goals - 05/18/15 1041    PT SHORT TERM GOAL #1   Title Patient will demonstrate L knee range 0-120 to reduce pain and enhance overall mobility and gait mechanics    Time 3   Period Weeks   Status New   PT SHORT TERM GOAL #2   Title Patient will be able to ambulate for at least 15-20 minutes with equal step lengths/stance time, full L knee TKE, minimal unsteadiness, and pain no more than 2/10 in order to assist in improving overall mobiltiy    Time 3   Period Weeks   Status New   PT SHORT TERM GOAL #3   Title Patient will be able to complete 5x sit to stand in 40 seconds or less, and TUG in 10 seconds or less without assistive device in order to demonstrate improving mobility and balance    Time 3   Period Weeks   Status New   PT SHORT TERM GOAL #4   Title Patient to be independent in correctly and consistently performing appropriate HEP, to be updated PRN    Time 3   Period Weeks   Status New           PT Long Term Goals - 05/18/15 1043    PT LONG TERM GOAL #1   Title Patient will demonstrate strength 4/5 in all tested muscle groups in order to reduce pain, enhance mobility, and improve performance on stairs    Time 6   Period Weeks   Status New   PT LONG TERM GOAL #2   Title Patient will be able to complete 6 minute walk test and will ambulate approximately 1046ft with no more than 1 rest break and pain no more than 2/10 in order to demonstrate improved mobility    Time 6   Period Weeks   Status New   PT LONG TERM GOAL #3   Title Patient to report that she has been able to attend her grandchildren's sports events with L knee pain no more than 2/10 in order to assist in returning to PLOF    Time 6   Period Weeks    Status New   PT LONG TERM GOAL #4   Title Patient to be able to reciprocally ascend/descend at least 4 standard height steps with one railing, minimal unsteadiness, and good eccentric control in order to reduce fall risk and improve mobilty at home and in community    Time 6   Period Weeks  Status New               Plan - 06/01/15 1115    Clinical Impression Statement Pt continues to report pain with use of her LLE as well as RLE. She demonstrates poor quad activation with quad sets and continues to have difficulty with other LE therex such as SAQ requiring mod to maxA to complete several repetitions. This session focused on quad activation as well as some manual therapy techniques to improve patellar mobility, knee ROM and reduce pain. She had good tolerance to manual treatment without increase in pain. Reviewed importance of HEP adherence and educated on proper sit/stand technique to improve quad activation and decrease back pain. Will continue to address soft tissue restrictions and focus on quad strengthening as appropriate.    Pt will benefit from skilled therapeutic intervention in order to improve on the following deficits Abnormal gait;Hypomobility;Decreased activity tolerance;Decreased strength;Pain;Decreased balance;Decreased mobility;Difficulty walking;Improper body mechanics;Decreased coordination;Impaired flexibility   Rehab Potential Good   Clinical Impairments Affecting Rehab Potential may be limited by pre-existing back pain and R knee pain    PT Frequency 3x / week   PT Duration 6 weeks   PT Treatment/Interventions ADLs/Self Care Home Management;Cryotherapy;Electrical Stimulation;Gait training;Stair training;Functional mobility training;Therapeutic activities;Therapeutic exercise;Balance training;Patient/family education;Manual techniques;Passive range of motion;DME Instruction;Neuromuscular re-education;Taping   PT Next Visit Plan SPC adjustment if necessary;  L knee  ROM; soft tissue/patellar mobility; quad strengthening    PT Home Exercise Plan Reviewed initial HEP    Consulted and Agree with Plan of Care Patient        Problem List Patient Active Problem List   Diagnosis Date Noted  . Acute medial meniscus tear of left knee   . Arthritis of knee   . Right flank discomfort 06/04/2013  . Dyspepsia 04/23/2013  . Dry mouth 04/23/2013  . GERD (gastroesophageal reflux disease) 11/20/2012  . Dysphagia, unspecified(787.20) 11/20/2012  . Adenomatous polyp 11/20/2012    11:27 AM,06/01/2015 Elly Modena PT, DPT [facility] [place] [address] [place], [place], [address] Phone: [phone]   Fax:  [phone]  Name: Shannon Gallegos MRN: [id number] Date of Birth: Aug 05, 1950

## 2015-06-03 ENCOUNTER — Ambulatory Visit (INDEPENDENT_AMBULATORY_CARE_PROVIDER_SITE_OTHER): Payer: No Typology Code available for payment source | Admitting: Orthopedic Surgery

## 2015-06-03 ENCOUNTER — Encounter (HOSPITAL_COMMUNITY): Payer: No Typology Code available for payment source | Admitting: Physical Therapy

## 2015-06-03 ENCOUNTER — Encounter: Payer: Self-pay | Admitting: Orthopedic Surgery

## 2015-06-03 VITALS — BP 123/78 | Ht 62.0 in | Wt 214.0 lb

## 2015-06-03 DIAGNOSIS — Z9889 Other specified postprocedural states: Secondary | ICD-10-CM

## 2015-06-03 NOTE — Progress Notes (Signed)
Patient ID: Shannon Gallegos, female   DOB: 1950/12/21, 65 y.o.   MRN: [id number]  Chief Complaint  Patient presents with  . Follow-up    post op 2, SALK, DOS 04/28/15    BP 123/78 mmHg  Ht 5\' 2"  (1.575 m)  Wt 214 lb (97.07 kg)  BMI 39.13 kg/m2  Second postop visit she is doing fairly well but she is having severe quadriceps weakness in the back is bothering her causing her pain in both legs pain in the lower back giving out symptoms of the left leg      ASSESSMENT AND PLAN   Continue quadriceps strengthening she should use a cane she'll probably need one permanently more for the back than the knee and we will see her in about a month she needs to see a back specialist

## 2015-06-06 ENCOUNTER — Ambulatory Visit (HOSPITAL_COMMUNITY): Payer: No Typology Code available for payment source | Admitting: Physical Therapy

## 2015-06-06 DIAGNOSIS — Z7409 Other reduced mobility: Secondary | ICD-10-CM

## 2015-06-06 DIAGNOSIS — R600 Localized edema: Secondary | ICD-10-CM

## 2015-06-06 DIAGNOSIS — M6281 Muscle weakness (generalized): Secondary | ICD-10-CM

## 2015-06-06 DIAGNOSIS — M25662 Stiffness of left knee, not elsewhere classified: Secondary | ICD-10-CM

## 2015-06-06 DIAGNOSIS — R262 Difficulty in walking, not elsewhere classified: Secondary | ICD-10-CM

## 2015-06-06 DIAGNOSIS — Z9889 Other specified postprocedural states: Secondary | ICD-10-CM

## 2015-06-06 NOTE — Therapy (Signed)
[place], [place], [address] Phone: [phone]   Fax:  [phone]  Physical Therapy Treatment  Patient Details  Name: Shannon Gallegos MRN: [id number] Date of Birth: 1950/09/12 Referring Provider: Arther Abbott   Encounter Date: 06/06/2015      PT End of Session - 06/06/15 1620    Visit Number 5   Number of Visits 18   Date for PT Re-Evaluation 06/15/15   Authorization Type GCCN 100%    Authorization Time Period 05/18/15 to 06/29/15   PT Start Time 1520   PT Stop Time 1608   PT Time Calculation (min) 48 min   Activity Tolerance Patient tolerated treatment well   Behavior During Therapy [facility] for tasks assessed/performed      Past Medical History  Diagnosis Date  . Borderline diabetes   . Hypertension   . Hypercholesterolemia   . GERD (gastroesophageal reflux disease)   . Vertigo   . Arthritis     Past Surgical History  Procedure Laterality Date  . Knee surgery Bilateral     arthroscopies  . Tubal ligation    . Colonoscopy  March 2014    Dr. Ellender Hose: sigmoid and descending colon diverticula, ADENOMATOUS polyp. Due for surveillance 2019  . Esophagogastroduodenoscopy (egd) with esophageal dilation N/A 12/09/2012    Dr. Oneida Alar: small hiatal hernia, Schatzki's ring at GE junction s/p Savary dilation, benign sessile polyps, moderate gastritis  . Cataract extraction Bilateral   . Knee arthroscopy with medial menisectomy Left 04/28/2015    Procedure: LEFT KNEE ARTHROSCOPY WITH PARTIAL MEDIAL MENISECTOMY;  Surgeon: Carole Civil, MD;  Location: [facility];  Service: Orthopedics;  Laterality: Left;    There were no vitals filed for this visit.  Visit Diagnosis:  S/P left knee arthroscopy  Knee stiffness, left  Localized edema  Muscle weakness  Difficulty walking  Impaired functional mobility and activity tolerance      Subjective Assessment - 06/06/15 1537    Subjective Pt states she went to [facility]  last week and states she will prob need a knee replacement in several years and wants her to see a spine specialist.    States he wants her to continue therapy.  Pt comes with SPC today.  Reports no pain at rest but increases "in the bones" and all over achiness.  States her knees give way and buckle and has continued weakness.  Moving pain 5/10 bilaterally.  Reports doing her HEP 2X day.     Currently in Pain? Yes   Pain Location Knee   Pain Orientation Right;Left   Pain Descriptors / Indicators Aching                         [facility] Adult PT Treatment/Exercise - 06/06/15 1543    Knee/Hip Exercises: Seated   Long Arc Quad Left;10 reps   Sit to [employer] 5 reps;2 sets   Knee/Hip Exercises: Supine   Quad Sets Left;2 sets;Strengthening;10 reps   Quad Sets Limitations 5" hold with towel under ankle   Short Arc [employer];Left;1 set;10 reps   Heel Slides Left;10 reps   Straight Leg Raises AAROM;2 sets;5 reps;Left   Manual Therapy   Manual Therapy Joint mobilization;Soft tissue mobilization   Manual therapy comments grade I-III tibiofemoral jt mobs   Soft tissue mobilization L quadriceps and lateral knee at scar, trigger point release  PT Short Term Goals - 05/18/15 1041    PT SHORT TERM GOAL #1   Title Patient will demonstrate L knee range 0-120 to reduce pain and enhance overall mobility and gait mechanics    Time 3   Period Weeks   Status New   PT SHORT TERM GOAL #2   Title Patient will be able to ambulate for at least 15-20 minutes with equal step lengths/stance time, full L knee TKE, minimal unsteadiness, and pain no more than 2/10 in order to assist in improving overall mobiltiy    Time 3   Period Weeks   Status New   PT SHORT TERM GOAL #3   Title Patient will be able to complete 5x sit to stand in 40 seconds or less, and TUG in 10 seconds or less without assistive device in order to demonstrate improving mobility and balance    Time 3    Period Weeks   Status New   PT SHORT TERM GOAL #4   Title Patient to be independent in correctly and consistently performing appropriate HEP, to be updated PRN    Time 3   Period Weeks   Status New           PT Long Term Goals - 05/18/15 1043    PT LONG TERM GOAL #1   Title Patient will demonstrate strength 4/5 in all tested muscle groups in order to reduce pain, enhance mobility, and improve performance on stairs    Time 6   Period Weeks   Status New   PT LONG TERM GOAL #2   Title Patient will be able to complete 6 minute walk test and will ambulate approximately 1022ft with no more than 1 rest break and pain no more than 2/10 in order to demonstrate improved mobility    Time 6   Period Weeks   Status New   PT LONG TERM GOAL #3   Title Patient to report that she has been able to attend her grandchildren's sports events with L knee pain no more than 2/10 in order to assist in returning to PLOF    Time 6   Period Weeks   Status New   PT LONG TERM GOAL #4   Title Patient to be able to reciprocally ascend/descend at least 4 standard height steps with one railing, minimal unsteadiness, and good eccentric control in order to reduce fall risk and improve mobilty at home and in community    Time 6   Period Weeks   Status New               Plan - 06/06/15 1634    Clinical Impression Statement Pt presents with independent gait using SPC, good form and adjusted properly.  continued poor quad activation, however able to complete LAQ in 90% of full ROM without pain.  Most discomfort at lateral  incision and palpable grinding and movement when attempting extension with SAQ.  Pt still unable to complete SAQ without AA and severe pain in this area.  Improved ability to complete when therapist applies pressure in this area to encourage improved tracking.  SLR completed in 1/2 ROM with 50% assist from therapist as well.  Pt reported no change in symptoms at end of session.  Requires  frequent redirection to task as patient very chatty during session.  Pt encouraged to continue her HEP regularly to improve quad strength and overall function.     Pt will benefit from skilled therapeutic intervention in order  to improve on the following deficits Abnormal gait;Hypomobility;Decreased activity tolerance;Decreased strength;Pain;Decreased balance;Decreased mobility;Difficulty walking;Improper body mechanics;Decreased coordination;Impaired flexibility   Rehab Potential Good   Clinical Impairments Affecting Rehab Potential may be limited by pre-existing back pain and R knee pain    PT Frequency 3x / week   PT Duration 6 weeks   PT Treatment/Interventions ADLs/Self Care Home Management;Cryotherapy;Electrical Stimulation;Gait training;Stair training;Functional mobility training;Therapeutic activities;Therapeutic exercise;Balance training;Patient/family education;Manual techniques;Passive range of motion;DME Instruction;Neuromuscular re-education;Taping   PT Next Visit Plan Continue progression toward goals.   L knee ROM; soft tissue/patellar mobility; quad strengthening    PT Home Exercise Plan Reviewed initial HEP    Consulted and Agree with Plan of Care Patient        Problem List Patient Active Problem List   Diagnosis Date Noted  . Acute medial meniscus tear of left knee   . Arthritis of knee   . Right flank discomfort 06/04/2013  . Dyspepsia 04/23/2013  . Dry mouth 04/23/2013  . GERD (gastroesophageal reflux disease) 11/20/2012  . Dysphagia, unspecified(787.20) 11/20/2012  . Adenomatous polyp 11/20/2012    Teena Irani, PTA/CLT [phone]  06/06/2015, 4:42 PM  [facility] [address] [place], [place], [address] Phone: [phone]   Fax:  [phone]  Name: Shannon Gallegos MRN: [id number] Date of Birth: Aug 14, 1950

## 2015-06-08 ENCOUNTER — Ambulatory Visit (HOSPITAL_COMMUNITY): Payer: No Typology Code available for payment source | Admitting: Physical Therapy

## 2015-06-08 DIAGNOSIS — Z7409 Other reduced mobility: Secondary | ICD-10-CM

## 2015-06-08 DIAGNOSIS — Z9889 Other specified postprocedural states: Secondary | ICD-10-CM

## 2015-06-08 DIAGNOSIS — M6281 Muscle weakness (generalized): Secondary | ICD-10-CM

## 2015-06-08 DIAGNOSIS — R262 Difficulty in walking, not elsewhere classified: Secondary | ICD-10-CM

## 2015-06-08 DIAGNOSIS — R600 Localized edema: Secondary | ICD-10-CM

## 2015-06-08 DIAGNOSIS — M25662 Stiffness of left knee, not elsewhere classified: Secondary | ICD-10-CM

## 2015-06-08 NOTE — Therapy (Signed)
[place], [place], [address] Phone: [phone]   Fax:  [phone]  Physical Therapy Treatment  Patient Details  Name: Shannon Gallegos MRN: [id number] Date of Birth: February 24, 1951 Referring Provider: Arther Abbott   Encounter Date: 06/08/2015      PT End of Session - 06/08/15 1108    Visit Number 6   Number of Visits 18   Date for PT Re-Evaluation 06/15/15   Authorization Type GCCN 100%    Authorization Time Period 05/18/15 to 06/29/15   PT Start Time 1018   PT Stop Time 1058   PT Time Calculation (min) 40 min   Activity Tolerance Patient tolerated treatment well   Behavior During Therapy [facility] for tasks assessed/performed      Past Medical History  Diagnosis Date  . Borderline diabetes   . Hypertension   . Hypercholesterolemia   . GERD (gastroesophageal reflux disease)   . Vertigo   . Arthritis     Past Surgical History  Procedure Laterality Date  . Knee surgery Bilateral     arthroscopies  . Tubal ligation    . Colonoscopy  March 2014    Dr. Ellender Hose: sigmoid and descending colon diverticula, ADENOMATOUS polyp. Due for surveillance 2019  . Esophagogastroduodenoscopy (egd) with esophageal dilation N/A 12/09/2012    Dr. Oneida Alar: small hiatal hernia, Schatzki's ring at GE junction s/p Savary dilation, benign sessile polyps, moderate gastritis  . Cataract extraction Bilateral   . Knee arthroscopy with medial menisectomy Left 04/28/2015    Procedure: LEFT KNEE ARTHROSCOPY WITH PARTIAL MEDIAL MENISECTOMY;  Surgeon: Carole Civil, MD;  Location: [facility];  Service: Orthopedics;  Laterality: Left;    There were no vitals filed for this visit.  Visit Diagnosis:  S/P left knee arthroscopy  Knee stiffness, left  Localized edema  Muscle weakness  Difficulty walking  Impaired functional mobility and activity tolerance      Subjective Assessment - 06/08/15 1022    Subjective Patient reports that she  has been trying to stick with her cane more with mobiltiy; no falls or close calls within the past couple of weeks. She reports she does not want a re-assess done today, would like to wait a week.    Pertinent History R knee pain, GERD, hx of vertigo, borderline diabetes, back pain    Patient Stated Goals be able to walk without any device for extended distances, get back to PLOF    Currently in Pain? Yes   Pain Score 8    Pain Location Knee   Pain Orientation Right;Left   Pain Descriptors / Indicators Throbbing   Pain Type Surgical pain;Chronic pain   Pain Radiating Towards radiates down leg    Pain Onset More than a month ago   Pain Frequency Intermittent   Aggravating Factors  walking long distances, stairs, sit to stand    Pain Relieving Factors rest   Effect of Pain on Daily Activities limited stairs and community distance walking                          [facility] Adult PT Treatment/Exercise - 06/08/15 0001    Knee/Hip Exercises: Stretches   Active Hamstring Stretch Both;2 reps;30 seconds   Active Hamstring Stretch Limitations 12 inch box    Gastroc Stretch Both;3 reps;30 seconds   Knee/Hip Exercises: Standing   Heel Raises Both;1 set;10 reps   Heel Raises Limitations B UE support; toe and  heel    Rocker Board 2 minutes   Rocker Board Limitations lateral and AP    Knee/Hip Exercises: Supine   Quad Sets Left;1 set;15 reps   Quad Sets Limitations 3 second hold    Bridges with [employer] Both;1 set;10 reps   Straight Leg Raises AAROM;Left;1 set;10 reps   Straight Leg Raises Limitations LE ER    Manual Therapy   Manual Therapy Joint mobilization   Manual therapy comments grade I-III tibiofemoral jt mobs; all manual perforemd separately from all other skilled interventions    Joint Mobilization attempteed patella mobilzations but stopped due to grinding pain                 PT Education - 06/08/15 1108    Education provided Yes   Education Details due  for re-assess today (patient reqeusted to push it back one week)   Person(s) Educated Patient   Methods Explanation   Comprehension Verbalized understanding          PT Short Term Goals - 05/18/15 1041    PT SHORT TERM GOAL #1   Title Patient will demonstrate L knee range 0-120 to reduce pain and enhance overall mobility and gait mechanics    Time 3   Period Weeks   Status New   PT SHORT TERM GOAL #2   Title Patient will be able to ambulate for at least 15-20 minutes with equal step lengths/stance time, full L knee TKE, minimal unsteadiness, and pain no more than 2/10 in order to assist in improving overall mobiltiy    Time 3   Period Weeks   Status New   PT SHORT TERM GOAL #3   Title Patient will be able to complete 5x sit to stand in 40 seconds or less, and TUG in 10 seconds or less without assistive device in order to demonstrate improving mobility and balance    Time 3   Period Weeks   Status New   PT SHORT TERM GOAL #4   Title Patient to be independent in correctly and consistently performing appropriate HEP, to be updated PRN    Time 3   Period Weeks   Status New           PT Long Term Goals - 05/18/15 1043    PT LONG TERM GOAL #1   Title Patient will demonstrate strength 4/5 in all tested muscle groups in order to reduce pain, enhance mobility, and improve performance on stairs    Time 6   Period Weeks   Status New   PT LONG TERM GOAL #2   Title Patient will be able to complete 6 minute walk test and will ambulate approximately 1015ft with no more than 1 rest break and pain no more than 2/10 in order to demonstrate improved mobility    Time 6   Period Weeks   Status New   PT LONG TERM GOAL #3   Title Patient to report that she has been able to attend her grandchildren's sports events with L knee pain no more than 2/10 in order to assist in returning to PLOF    Time 6   Period Weeks   Status New   PT LONG TERM GOAL #4   Title Patient to be able to  reciprocally ascend/descend at least 4 standard height steps with one railing, minimal unsteadiness, and good eccentric control in order to reduce fall risk and improve mobilty at home and in community    Time 6  Period Weeks   Status New               Plan - 06/08/15 1109    Clinical Impression Statement Patient due for re-assess today however requested that it be pushed back one week. Continued working on general knee mobility and strengthening today. Continue to note poor quad muscle activation and strength during all functional tasks today; appears to be due to combination of weakness and limitation from pain at lateral area of knee with grinding with movement. Introduced VMO exercises to attempt to assist patella in improving position during ROM exercises, required assist to perform SLR today. Cues for form and posture throughout session.    Pt will benefit from skilled therapeutic intervention in order to improve on the following deficits Abnormal gait;Hypomobility;Decreased activity tolerance;Decreased strength;Pain;Decreased balance;Decreased mobility;Difficulty walking;Improper body mechanics;Decreased coordination;Impaired flexibility   Rehab Potential Good   Clinical Impairments Affecting Rehab Potential may be limited by pre-existing back pain and R knee pain    PT Frequency 3x / week   PT Duration 6 weeks   PT Treatment/Interventions ADLs/Self Care Home Management;Cryotherapy;Electrical Stimulation;Gait training;Stair training;Functional mobility training;Therapeutic activities;Therapeutic exercise;Balance training;Patient/family education;Manual techniques;Passive range of motion;DME Instruction;Neuromuscular re-education;Taping   PT Next Visit Plan Continue progression toward goals.   L knee ROM; soft tissue/patellar mobility; quad strengthening. Potentially trial estim for quad strenght.    PT Home Exercise Plan Reviewed initial HEP    Consulted and Agree with Plan of Care  Patient        Problem List Patient Active Problem List   Diagnosis Date Noted  . Acute medial meniscus tear of left knee   . Arthritis of knee   . Right flank discomfort 06/04/2013  . Dyspepsia 04/23/2013  . Dry mouth 04/23/2013  . GERD (gastroesophageal reflux disease) 11/20/2012  . Dysphagia, unspecified(787.20) 11/20/2012  . Adenomatous polyp 11/20/2012    Deniece Ree PT, DPT [facility] [address] [place], [place], [address] Phone: [phone]   Fax:  [phone]  Name: Shannon Gallegos MRN: [id number] Date of Birth: [birth year]

## 2015-06-10 ENCOUNTER — Ambulatory Visit (HOSPITAL_COMMUNITY): Payer: No Typology Code available for payment source

## 2015-06-10 DIAGNOSIS — R262 Difficulty in walking, not elsewhere classified: Secondary | ICD-10-CM

## 2015-06-10 DIAGNOSIS — Z9889 Other specified postprocedural states: Secondary | ICD-10-CM

## 2015-06-10 DIAGNOSIS — R6889 Other general symptoms and signs: Secondary | ICD-10-CM

## 2015-06-10 DIAGNOSIS — Z7409 Other reduced mobility: Secondary | ICD-10-CM

## 2015-06-10 DIAGNOSIS — R600 Localized edema: Secondary | ICD-10-CM

## 2015-06-10 DIAGNOSIS — M6281 Muscle weakness (generalized): Secondary | ICD-10-CM

## 2015-06-10 DIAGNOSIS — M25561 Pain in right knee: Secondary | ICD-10-CM

## 2015-06-10 DIAGNOSIS — M25659 Stiffness of unspecified hip, not elsewhere classified: Secondary | ICD-10-CM

## 2015-06-10 DIAGNOSIS — M25662 Stiffness of left knee, not elsewhere classified: Secondary | ICD-10-CM

## 2015-06-10 DIAGNOSIS — M25562 Pain in left knee: Secondary | ICD-10-CM

## 2015-06-10 NOTE — Therapy (Signed)
[place], [place], [address] Phone: [phone]   Fax:  [phone]  Physical Therapy Treatment  Patient Details  Name: RIFKY LAPRE MRN: [id number] Date of Birth: [birth year] Referring Provider: Arther Abbott  Encounter Date: 06/10/2015      PT End of Session - 06/10/15 1029    Visit Number 7   Number of Visits 18   Date for PT Re-Evaluation 06/15/15   Authorization Type GCCN 100%    Authorization Time Period 05/18/15 to 06/29/15   PT Start Time 1022   PT Stop Time 1105   PT Time Calculation (min) 43 min   Activity Tolerance Patient tolerated treatment well   Behavior During Therapy [facility] for tasks assessed/performed      Past Medical History  Diagnosis Date  . Borderline diabetes   . Hypertension   . Hypercholesterolemia   . GERD (gastroesophageal reflux disease)   . Vertigo   . Arthritis     Past Surgical History  Procedure Laterality Date  . Knee surgery Bilateral     arthroscopies  . Tubal ligation    . Colonoscopy  March 2014    Dr. Ellender Hose: sigmoid and descending colon diverticula, ADENOMATOUS polyp. Due for surveillance 2019  . Esophagogastroduodenoscopy (egd) with esophageal dilation N/A 12/09/2012    Dr. Oneida Alar: small hiatal hernia, Schatzki's ring at GE junction s/p Savary dilation, benign sessile polyps, moderate gastritis  . Cataract extraction Bilateral   . Knee arthroscopy with medial menisectomy Left 04/28/2015    Procedure: LEFT KNEE ARTHROSCOPY WITH PARTIAL MEDIAL MENISECTOMY;  Surgeon: Carole Civil, MD;  Location: [facility];  Service: Orthopedics;  Laterality: Left;    There were no vitals filed for this visit.  Visit Diagnosis:  S/P left knee arthroscopy  Knee stiffness, left  Localized edema  Muscle weakness  Difficulty walking  Impaired functional mobility and activity tolerance  Bilateral knee pain  Hip stiffness, unspecified laterality  Decreased functional  activity tolerance      Subjective Assessment - 06/10/15 1017    Subjective Pt stated knee is feeling good today, c/o painful patella catching    Pertinent History R knee pain, GERD, hx of vertigo, borderline diabetes, back pain    Patient Stated Goals be able to walk without any device for extended distances, get back to PLOF    Currently in Pain? No/denies            [facility] PT Assessment - 06/10/15 0001    Assessment   Medical Diagnosis L knee arthroscopy    Referring Provider Arther Abbott   Onset Date/Surgical Date 04/28/15   Next MD Visit 07/01/2015   AROM   Left Knee Extension 1   Left Knee Flexion 121            [facility] Adult PT Treatment/Exercise - 06/10/15 0001    Knee/Hip Exercises: Supine   Quad Sets Left;1 set;15 reps   [employer] Limitations 3" holds   Short Arc [employer];Left  Russian estim distal quad   Bridges with [employer] Both;15 reps  ball btyeween knees for VMO   Straight Leg Raises Both;Right;AROM;Left;AAROM;10 reps   Patellar Mobs complete   Modalities   Modalities Geographical information systems officer Parameters Russian estim 10/30 intensity set to tolerance   Printmaker Goals Strength  PT Short Term Goals - 05/18/15 1041    PT SHORT TERM GOAL #1   Title Patient will demonstrate L knee range 0-120 to reduce pain and enhance overall mobility and gait mechanics    Time 3   Period Weeks   Status New   PT SHORT TERM GOAL #2   Title Patient will be able to ambulate for at least 15-20 minutes with equal step lengths/stance time, full L knee TKE, minimal unsteadiness, and pain no more than 2/10 in order to assist in improving overall mobiltiy    Time 3   Period Weeks   Status New   PT SHORT TERM GOAL #3   Title Patient will be able to complete 5x sit to stand in 40  seconds or less, and TUG in 10 seconds or less without assistive device in order to demonstrate improving mobility and balance    Time 3   Period Weeks   Status New   PT SHORT TERM GOAL #4   Title Patient to be independent in correctly and consistently performing appropriate HEP, to be updated PRN    Time 3   Period Weeks   Status New           PT Long Term Goals - 05/18/15 1043    PT LONG TERM GOAL #1   Title Patient will demonstrate strength 4/5 in all tested muscle groups in order to reduce pain, enhance mobility, and improve performance on stairs    Time 6   Period Weeks   Status New   PT LONG TERM GOAL #2   Title Patient will be able to complete 6 minute walk test and will ambulate approximately 1028f with no more than 1 rest break and pain no more than 2/10 in order to demonstrate improved mobility    Time 6   Period Weeks   Status New   PT LONG TERM GOAL #3   Title Patient to report that she has been able to attend her grandchildren's sports events with L knee pain no more than 2/10 in order to assist in returning to PLOF    Time 6   Period Weeks   Status New   PT LONG TERM GOAL #4   Title Patient to be able to reciprocally ascend/descend at least 4 standard height steps with one railing, minimal unsteadiness, and good eccentric control in order to reduce fall risk and improve mobilty at home and in community    Time 6   Period Weeks   Status New               Plan - 06/10/15 1034    Clinical Impression Statement Session focus on improving LE strengthening especially quadriceps.  Pt continues to demonstrate extremely weak quad with inability to fully extend knee or complete SLR without assistance and continues to complain of patella "catching" with increased knee pain during SAQ and LA.  Began russian estim to distal quadricep for strengthening with abiltiy to complete SAQ with AROM though unable to fully exten knee and minimal c/o patella "catching".  AROM  improvings 0-121 degrees today.   Pt will benefit from skilled therapeutic intervention in order to improve on the following deficits Abnormal gait;Hypomobility;Decreased activity tolerance;Decreased strength;Pain;Decreased balance;Decreased mobility;Difficulty walking;Improper body mechanics;Decreased coordination;Impaired flexibility   Rehab Potential Good   Clinical Impairments Affecting Rehab Potential may be limited by pre-existing back pain and R knee pain    PT Frequency 3x / week   PT Duration 6 weeks  PT Treatment/Interventions ADLs/Self Care Home Management;Cryotherapy;Electrical Stimulation;Gait training;Stair training;Functional mobility training;Therapeutic activities;Therapeutic exercise;Balance training;Patient/family education;Manual techniques;Passive range of motion;DME Instruction;Neuromuscular re-education;Taping   PT Next Visit Plan Continue session focus on improving quad strengthening, ROM goals have been met.  Continue russian estim for quad strengthening.        Problem List Patient Active Problem List   Diagnosis Date Noted  . Acute medial meniscus tear of left knee   . Arthritis of knee   . Right flank discomfort 06/04/2013  . Dyspepsia 04/23/2013  . Dry mouth 04/23/2013  . GERD (gastroesophageal reflux disease) 11/20/2012  . Dysphagia, unspecified(787.20) 11/20/2012  . Adenomatous polyp 11/20/2012   Ihor Austin, [place]; [place]  Aldona Lento 06/10/2015, 12:25 PM  [facility] [address] [place], [place], [address] Phone: [phone]   Fax:  [phone]  Name: KATE LAROCK MRN: [id number] Date of Birth: 07-28-1950

## 2015-06-13 ENCOUNTER — Ambulatory Visit (HOSPITAL_COMMUNITY): Payer: No Typology Code available for payment source | Admitting: Physical Therapy

## 2015-06-13 DIAGNOSIS — R600 Localized edema: Secondary | ICD-10-CM

## 2015-06-13 DIAGNOSIS — R262 Difficulty in walking, not elsewhere classified: Secondary | ICD-10-CM

## 2015-06-13 DIAGNOSIS — Z7409 Other reduced mobility: Secondary | ICD-10-CM

## 2015-06-13 DIAGNOSIS — Z9889 Other specified postprocedural states: Secondary | ICD-10-CM

## 2015-06-13 DIAGNOSIS — M6281 Muscle weakness (generalized): Secondary | ICD-10-CM

## 2015-06-13 DIAGNOSIS — M25662 Stiffness of left knee, not elsewhere classified: Secondary | ICD-10-CM

## 2015-06-13 NOTE — Therapy (Signed)
[facility] [address] [place], [place], [address] Phone: [phone]   Fax:  [phone]  Physical Therapy Treatment (Re-Assessment)  Patient Details  Name: Shannon Gallegos MRN: [id number] Date of Birth: Oct 29, 1950 Referring Provider: Arther Abbott  Encounter Date: 06/13/2015      PT End of Session - 06/13/15 1605    Visit Number 8   Number of Visits 20   Date for PT Re-Evaluation 07/11/15   Authorization Type GCCN 100%    Authorization Time Period 05/18/15 to 06/29/15   PT Start Time 1525  Patient arrived late    PT Stop Time 1600   PT Time Calculation (min) 35 min   Activity Tolerance Patient tolerated treatment well   Behavior During Therapy [facility] for tasks assessed/performed      Past Medical History  Diagnosis Date  . Borderline diabetes   . Hypertension   . Hypercholesterolemia   . GERD (gastroesophageal reflux disease)   . Vertigo   . Arthritis     Past Surgical History  Procedure Laterality Date  . Knee surgery Bilateral     arthroscopies  . Tubal ligation    . Colonoscopy  March 2014    Dr. Ellender Hose: sigmoid and descending colon diverticula, ADENOMATOUS polyp. Due for surveillance 2019  . Esophagogastroduodenoscopy (egd) with esophageal dilation N/A 12/09/2012    Dr. Oneida Alar: small hiatal hernia, Schatzki's ring at GE junction s/p Savary dilation, benign sessile polyps, moderate gastritis  . Cataract extraction Bilateral   . Knee arthroscopy with medial menisectomy Left 04/28/2015    Procedure: LEFT KNEE ARTHROSCOPY WITH PARTIAL MEDIAL MENISECTOMY;  Surgeon: Carole Civil, MD;  Location: [facility];  Service: Orthopedics;  Laterality: Left;    There were no vitals filed for this visit.  Visit Diagnosis:  S/P left knee arthroscopy  Knee stiffness, left  Localized edema  Muscle weakness  Difficulty walking  Impaired functional mobility and activity tolerance      Subjective Assessment - 06/13/15 1527     Subjective Patient reports that she had a near miss with a fall the other day; she was standing at the counter and her knee just tried to give way due to weakness. She feels that her pain is improving from the surgery, but the knee itself just feels tired and tight, "..feels like by night time I had a rough, long day...". She reports she is a little hesitant in reoprting that things are getting better as she does keep having little setbacks. Stairs are still very difficult and getting up from a chair is hard, getting down to toilet is hard; still can't cook or wash dishes in standing, or perform many household chores.    Pertinent History R knee pain, GERD, hx of vertigo, borderline diabetes, back pain    How long can you sit comfortably? 3/20- getting into and out of chair is harding than sitting; sitting for a long time messes with her back too   How long can you stand comfortably? 3/20- no longer has immediate pain but gets tired quickly, maybe 5 minutes    How long can you walk comfortably? 3/20- with cane, she was able to walk outside just in her yard    Patient Stated Goals be able to walk without any device for extended distances, get back to PLOF    Currently in Pain? Yes   Pain Score 2    Pain Location Knee   Pain Orientation Left   Pain Descriptors /  Indicators Discomfort;Sharp   Pain Type Surgical pain;Chronic pain   Pain Radiating Towards none    Pain Onset More than a month ago   Pain Frequency Intermittent   Aggravating Factors  stairs, walking, getting up and down    Pain Relieving Factors rest    Effect of Pain on Daily Activities limits stairs and PLOF activiteis             [facility] PT Assessment - 06/13/15 0001    AROM   Left Knee Extension 0   Left Knee Flexion 122   Strength   Right Hip Flexion 3-/5   Right Hip Extension 2/5   Right Hip ABduction 3/5   Left Hip Flexion 3/5   Left Hip Extension 2/5   Left Hip ABduction 2+/5   Right Knee Flexion 3/5   Right Knee  Extension 3-/5  pain limited    Left Knee Flexion 3/5   Left Knee Extension 2+/5   Right Ankle Dorsiflexion 4-/5   Left Ankle Dorsiflexion 3+/5   Transfers   Five time sit to stand comments  33.06 seconds, B UEs    6 minute walk test results    Aerobic Endurance Distance Walked 458   Endurance additional comments 3MWT cane    High Level Balance   High Level Balance Comments TUG 16.4 with cane                              PT Education - 06/13/15 1604    Education provided Yes   Education Details progress with skilled PT services, plan of care moving forward, impact of severely weak quads on general function and fall risk    Person(s) Educated Patient   Methods Explanation   Comprehension Verbalized understanding          PT Short Term Goals - 06/13/15 1555    PT SHORT TERM GOAL #1   Title Patient will demonstrate L knee range 0-120 to reduce pain and enhance overall mobility and gait mechanics    Baseline 3/20- 0 to 122    Time 3   Period Weeks   Status Achieved   PT SHORT TERM GOAL #2   Title Patient will be able to ambulate for at least 15-20 minutes with equal step lengths/stance time, full L knee TKE, minimal unsteadiness, and pain no more than 2/10 in order to assist in improving overall mobiltiy    Time 3   Period Weeks   Status On-going   PT SHORT TERM GOAL #3   Title Patient will be able to complete 5x sit to stand in 40 seconds or less, and TUG in 10 seconds or less without assistive device in order to demonstrate improving mobility and balance    Baseline 3/20- 5 time sit to stand 33 seconds, TUG 16 with cane    Time 3   Period Weeks   Status Partially Met   PT SHORT TERM GOAL #4   Title Patient to be independent in correctly and consistently performing appropriate HEP, to be updated PRN    Baseline 3/20- "doing every day, going alright"    Time 3   Period Weeks   Status Achieved           PT Long Term Goals - 06/13/15 1557    PT  LONG TERM GOAL #1   Title Patient will demonstrate strength 4/5 in all tested muscle groups in order to reduce pain,  enhance mobility, and improve performance on stairs    Time 6   Period Weeks   Status On-going   PT LONG TERM GOAL #2   Title Patient will be able to complete 6 minute walk test and will ambulate approximately 1023f with no more than 1 rest break and pain no more than 2/10 in order to demonstrate improved mobility    Time 6   Period Weeks   Status On-going   PT LONG TERM GOAL #3   Title Patient to report that she has been able to attend her grandchildren's sports events with L knee pain no more than 2/10 in order to assist in returning to PLOF    Baseline 3/20- went to a game last week, can't remember pain levels at the time    Time 6   Period Weeks   Status Achieved   PT LONG TERM GOAL #4   Title Patient to be able to reciprocally ascend/descend at least 4 standard height steps with one railing, minimal unsteadiness, and good eccentric control in order to reduce fall risk and improve mobilty at home and in community    Time 6   Period Weeks   Status On-going               Plan - 06/13/15 1606    Clinical Impression Statement Re-assessment performed today. Patient does show some minor improvements in functional testing such as 5x sit to stand and ROM however remains severely limited in terms of quad strength in particular, shows no signficant improvement in balance, general fall risk, or strength in general. However it is important to realize that patient is also limited by her pre-existing back pain, also by pain in bilateral knees with palpable grinding under patellas. Somewhat limited in performing aggressive strengthening to address quadriceps weakness and general function due to patient's pain levels. At this time recommend ongoing skilled PT services in order to continue to attempt to address functional limitations and assist in reaching optimal level of  function.    Pt will benefit from skilled therapeutic intervention in order to improve on the following deficits Abnormal gait;Hypomobility;Decreased activity tolerance;Decreased strength;Pain;Decreased balance;Decreased mobility;Difficulty walking;Improper body mechanics;Decreased coordination;Impaired flexibility   Rehab Potential Good   Clinical Impairments Affecting Rehab Potential may be limited by pre-existing back pain and R knee pain    PT Frequency Other (comment)  2-3 times per week    PT Duration 4 weeks   PT Treatment/Interventions ADLs/Self Care Home Management;Cryotherapy;Electrical Stimulation;Gait training;Stair training;Functional mobility training;Therapeutic activities;Therapeutic exercise;Balance training;Patient/family education;Manual techniques;Passive range of motion;DME Instruction;Neuromuscular re-education;Taping   PT Next Visit Plan Continue session focus on improving quad strengthening, ROM goals have been met.  Continue russian estim for quad strengthening.   PT Home Exercise Plan Reviewed initial HEP    Consulted and Agree with Plan of Care Patient        Problem List Patient Active Problem List   Diagnosis Date Noted  . Acute medial meniscus tear of left knee   . Arthritis of knee   . Right flank discomfort 06/04/2013  . Dyspepsia 04/23/2013  . Dry mouth 04/23/2013  . GERD (gastroesophageal reflux disease) 11/20/2012  . Dysphagia, unspecified(787.20) 11/20/2012  . Adenomatous polyp 11/20/2012    KDeniece ReePT, DPT [id number] [address]Phone: [phone]  Fax:  [phone] Name: Shannon KOMARMRN: [id number] of Birth: [birth year]

## 2015-06-14 ENCOUNTER — Ambulatory Visit: Payer: Self-pay | Admitting: Orthopedic Surgery

## 2015-06-15 ENCOUNTER — Ambulatory Visit (HOSPITAL_COMMUNITY): Payer: No Typology Code available for payment source | Admitting: Physical Therapy

## 2015-06-15 DIAGNOSIS — Z9889 Other specified postprocedural states: Secondary | ICD-10-CM

## 2015-06-15 DIAGNOSIS — M6281 Muscle weakness (generalized): Secondary | ICD-10-CM

## 2015-06-15 DIAGNOSIS — Z7409 Other reduced mobility: Secondary | ICD-10-CM

## 2015-06-15 DIAGNOSIS — R600 Localized edema: Secondary | ICD-10-CM

## 2015-06-15 DIAGNOSIS — M25662 Stiffness of left knee, not elsewhere classified: Secondary | ICD-10-CM

## 2015-06-15 DIAGNOSIS — R262 Difficulty in walking, not elsewhere classified: Secondary | ICD-10-CM

## 2015-06-15 NOTE — Therapy (Signed)
[place], [place], [address] Phone: [phone]   Fax:  [phone]  Physical Therapy Treatment  Patient Details  Name: Shannon Gallegos MRN: [id number] Date of Birth: May 24, 1950 Referring Provider: Arther Abbott  Encounter Date: 06/15/2015      PT End of Session - 06/15/15 1112    Visit Number 9   Number of Visits 20   Date for PT Re-Evaluation 07/11/15   Authorization Type GCCN 100%    Authorization Time Period 05/18/15 to 06/29/15   PT Start Time 0938  late from pervious patient   PT Stop Time 1016   PT Time Calculation (min) 38 min   Activity Tolerance Patient tolerated treatment well   Behavior During Therapy [facility] for tasks assessed/performed      Past Medical History  Diagnosis Date  . Borderline diabetes   . Hypertension   . Hypercholesterolemia   . GERD (gastroesophageal reflux disease)   . Vertigo   . Arthritis     Past Surgical History  Procedure Laterality Date  . Knee surgery Bilateral     arthroscopies  . Tubal ligation    . Colonoscopy  March 2014    Dr. Ellender Hose: sigmoid and descending colon diverticula, ADENOMATOUS polyp. Due for surveillance 2019  . Esophagogastroduodenoscopy (egd) with esophageal dilation N/A 12/09/2012    Dr. Oneida Alar: small hiatal hernia, Schatzki's ring at GE junction s/p Savary dilation, benign sessile polyps, moderate gastritis  . Cataract extraction Bilateral   . Knee arthroscopy with medial menisectomy Left 04/28/2015    Procedure: LEFT KNEE ARTHROSCOPY WITH PARTIAL MEDIAL MENISECTOMY;  Surgeon: Carole Civil, MD;  Location: [facility];  Service: Orthopedics;  Laterality: Left;    There were no vitals filed for this visit.  Visit Diagnosis:  S/P left knee arthroscopy  Knee stiffness, left  Localized edema  Muscle weakness  Difficulty walking  Impaired functional mobility and activity tolerance      Subjective Assessment - 06/15/15 0941    Subjective Patient reports that her back is bothering her more than her knee today, reports that when her back hurts like this it just "saps all of my strength".    Currently in Pain? Yes   Pain Score 8    Pain Location Back  back 8/10, L knee 2-3/10                         [facility] Adult PT Treatment/Exercise - 06/15/15 0001    Knee/Hip Exercises: Stretches   Active Hamstring Stretch Both;2 reps;30 seconds   Active Hamstring Stretch Limitations 12 inch box    Gastroc Stretch Both;3 reps;30 seconds   Knee/Hip Exercises: Standing   Other Standing Knee Exercises sit to stand with core-quad-forward lean- stand sequence 1x5    Knee/Hip Exercises: Supine   Bridges with [employer] Both;2 sets;10 reps   Straight Leg Raises Right;2 sets;10 reps   Straight Leg Raises Limitations specific core-quad-leg lift sequence    Knee/Hip Exercises: Sidelying   Hip ABduction Both;2 sets;10 reps                PT Education - 06/15/15 1111    Education provided Yes   Education Details importance of core activation with all funtional tasks and exercises especially with back pain complicating her rehab    Person(s) Educated Patient   Methods Explanation   Comprehension Verbalized understanding          PT Short  Term Goals - 06/13/15 1555    PT SHORT TERM GOAL #1   Title Patient will demonstrate L knee range 0-120 to reduce pain and enhance overall mobility and gait mechanics    Baseline 3/20- 0 to 122    Time 3   Period Weeks   Status Achieved   PT SHORT TERM GOAL #2   Title Patient will be able to ambulate for at least 15-20 minutes with equal step lengths/stance time, full L knee TKE, minimal unsteadiness, and pain no more than 2/10 in order to assist in improving overall mobiltiy    Time 3   Period Weeks   Status On-going   PT SHORT TERM GOAL #3   Title Patient will be able to complete 5x sit to stand in 40 seconds or less, and TUG in 10 seconds or less without  assistive device in order to demonstrate improving mobility and balance    Baseline 3/20- 5 time sit to stand 33 seconds, TUG 16 with cane    Time 3   Period Weeks   Status Partially Met   PT SHORT TERM GOAL #4   Title Patient to be independent in correctly and consistently performing appropriate HEP, to be updated PRN    Baseline 3/20- "doing every day, going alright"    Time 3   Period Weeks   Status Achieved           PT Long Term Goals - 06/13/15 1557    PT LONG TERM GOAL #1   Title Patient will demonstrate strength 4/5 in all tested muscle groups in order to reduce pain, enhance mobility, and improve performance on stairs    Time 6   Period Weeks   Status On-going   PT LONG TERM GOAL #2   Title Patient will be able to complete 6 minute walk test and will ambulate approximately 1040f with no more than 1 rest break and pain no more than 2/10 in order to demonstrate improved mobility    Time 6   Period Weeks   Status On-going   PT LONG TERM GOAL #3   Title Patient to report that she has been able to attend her grandchildren's sports events with L knee pain no more than 2/10 in order to assist in returning to PLOF    Baseline 3/20- went to a game last week, can't remember pain levels at the time    Time 6   Period Weeks   Status Achieved   PT LONG TERM GOAL #4   Title Patient to be able to reciprocally ascend/descend at least 4 standard height steps with one railing, minimal unsteadiness, and good eccentric control in order to reduce fall risk and improve mobilty at home and in community    Time 6   Period Weeks   Status On-going               Plan - 06/15/15 1114    Clinical Impression Statement Began session with functional stretching, then attempted new approach towards functional strength involving activation of proximal and core muscles before attempting quad activation; progressed to quad activation with specific sequence of core squeeze: quad set: straight  leg raise, and patient able to perform  quad set with improved form and reduced assist from PT. Also follwed core activation:quad squeeze:stand for sit to stands today with imrpvoed muscle activation during transfer. Advised patient to follow this sequence at home for HEP as well. Patient very pleasant but talkative today, somewhat limited  extent of  activity able to be performed in today's session.     Pt will benefit from skilled therapeutic intervention in order to improve on the following deficits Abnormal gait;Hypomobility;Decreased activity tolerance;Decreased strength;Pain;Decreased balance;Decreased mobility;Difficulty walking;Improper body mechanics;Decreased coordination;Impaired flexibility   Rehab Potential Good   Clinical Impairments Affecting Rehab Potential may be limited by pre-existing back pain and R knee pain    PT Frequency Other (comment)   PT Duration 4 weeks   PT Treatment/Interventions ADLs/Self Care Home Management;Cryotherapy;Electrical Stimulation;Gait training;Stair training;Functional mobility training;Therapeutic activities;Therapeutic exercise;Balance training;Patient/family education;Manual techniques;Passive range of motion;DME Instruction;Neuromuscular re-education;Taping   PT Next Visit Plan continue trialing core squeeze:quad squeeze:full exercise to see if this continues to improve quad activation/form/strength    PT Home Exercise Plan Reviewed initial HEP    Consulted and Agree with Plan of Care Patient        Problem List Patient Active Problem List   Diagnosis Date Noted  . Acute medial meniscus tear of left knee   . Arthritis of knee   . Right flank discomfort 06/04/2013  . Dyspepsia 04/23/2013  . Dry mouth 04/23/2013  . GERD (gastroesophageal reflux disease) 11/20/2012  . Dysphagia, unspecified(787.20) 11/20/2012  . Adenomatous polyp 11/20/2012    Deniece Ree PT, DPT [facility] [address] [place], [place], [address] Phone: [phone]   Fax:  [phone]  Name: Shannon Gallegos MRN: [id number] Date of Birth: 27-Nov-1950

## 2015-06-17 ENCOUNTER — Encounter (HOSPITAL_COMMUNITY): Payer: No Typology Code available for payment source

## 2015-06-20 ENCOUNTER — Encounter (HOSPITAL_COMMUNITY): Payer: No Typology Code available for payment source | Admitting: Physical Therapy

## 2015-06-22 ENCOUNTER — Encounter (HOSPITAL_COMMUNITY): Payer: No Typology Code available for payment source | Admitting: Physical Therapy

## 2015-06-24 ENCOUNTER — Telehealth (HOSPITAL_COMMUNITY): Payer: Self-pay | Admitting: Physical Therapy

## 2015-06-24 ENCOUNTER — Ambulatory Visit (HOSPITAL_COMMUNITY): Payer: No Typology Code available for payment source | Admitting: Physical Therapy

## 2015-06-24 NOTE — Telephone Encounter (Signed)
Patient is dizzy from vertigo and needs to be seen for that as well, reschedule re-eval for Knee on 07/05/15. Will put pt on waitling list. NF

## 2015-07-01 ENCOUNTER — Ambulatory Visit (INDEPENDENT_AMBULATORY_CARE_PROVIDER_SITE_OTHER): Payer: No Typology Code available for payment source | Admitting: Orthopedic Surgery

## 2015-07-01 VITALS — BP 142/89 | Ht 62.0 in | Wt 214.0 lb

## 2015-07-01 DIAGNOSIS — M545 Low back pain, unspecified: Secondary | ICD-10-CM

## 2015-07-01 DIAGNOSIS — M25561 Pain in right knee: Secondary | ICD-10-CM

## 2015-07-01 MED ORDER — DICLOFENAC SODIUM 75 MG PO TBEC: 75.0000 mg | DELAYED_RELEASE_TABLET | Freq: Two times a day (BID) | ORAL | Status: DC

## 2015-07-01 MED ORDER — HYDROCODONE-ACETAMINOPHEN 5-325 MG PO TABS: 1.0000 | ORAL_TABLET | Freq: Four times a day (QID) | ORAL | Status: DC | PRN

## 2015-07-01 NOTE — Patient Instructions (Signed)
We will schedule MRIs for you and call you with appointments

## 2015-07-01 NOTE — Progress Notes (Signed)
Postop visit #3 arthroscopy left knee on February 2 this year. Complains of crepitance in the left knee. She also complains of pain and giving way symptoms in the right knee with swelling.  She's having ongoing back pain.  We had discussed prior to her left knee surgery MRI right knee and also lumbar spine as she continued back pain.  At this point we will go ahead and get those MRIs done to image the lumbar spine and the knee.  Her prognosis is guarded.  Follow-up after MRI imaging

## 2015-07-05 ENCOUNTER — Ambulatory Visit (HOSPITAL_COMMUNITY): Payer: No Typology Code available for payment source | Admitting: Physical Therapy

## 2015-07-08 ENCOUNTER — Ambulatory Visit (HOSPITAL_COMMUNITY): Payer: Self-pay

## 2015-07-08 ENCOUNTER — Ambulatory Visit (HOSPITAL_COMMUNITY): Admission: RE | Admit: 2015-07-08 | Payer: Self-pay | Source: Ambulatory Visit

## 2015-07-11 ENCOUNTER — Ambulatory Visit: Payer: No Typology Code available for payment source

## 2015-07-12 ENCOUNTER — Telehealth: Payer: Self-pay | Admitting: Orthopedic Surgery

## 2015-07-12 NOTE — Telephone Encounter (Signed)
Patient called to relay that her MRI, scheduled at [facility] on Friday, 07/08/15, was not done - states was cancelled by radiology department, due to patient indicating she may still have difficulty, even though medication was prescribed.  States [employer] staff suggested that she have at [facility], open MRI.  Also patient has been re-applying for [facility] discount, and I have contacted financial counselor to check status.  Please advise patient: ph#'s: Home [phone] and Cell [phone].

## 2015-07-12 NOTE — Telephone Encounter (Signed)
Does [facility] accept [facility] discount??

## 2015-07-13 NOTE — Telephone Encounter (Signed)
Yes. Currently still awaiting verification if patient's discount has been re-issued.

## 2015-07-18 ENCOUNTER — Encounter: Payer: Self-pay | Admitting: *Deleted

## 2015-07-18 ENCOUNTER — Encounter (INDEPENDENT_AMBULATORY_CARE_PROVIDER_SITE_OTHER): Payer: Self-pay

## 2015-07-18 ENCOUNTER — Ambulatory Visit
Admission: RE | Admit: 2015-07-18 | Discharge: 2015-07-18 | Disposition: A | Discharge status: Home / Self Care | Payer: Self-pay | Source: Ambulatory Visit | Attending: Oncology | Admitting: Oncology

## 2015-07-18 ENCOUNTER — Ambulatory Visit: Payer: No Typology Code available for payment source | Attending: Oncology | Admitting: *Deleted

## 2015-07-18 VITALS — BP 117/72 | HR 84 | Temp 98.2°F | Ht 63.78 in | Wt 213.8 lb

## 2015-07-18 DIAGNOSIS — Z0000 Encounter for general adult medical examination without abnormal findings: Secondary | ICD-10-CM

## 2015-07-18 NOTE — Progress Notes (Signed)
Subjective:     Patient ID: Shannon Gallegos, female   DOB: Jan 16, 1951, 65 y.o.   MRN: [id number]  HPI   Review of Systems     Objective:   Physical Exam  Pulmonary/Chest: Right breast exhibits no inverted nipple, no mass, no nipple discharge, no skin change and no tenderness. Left breast exhibits no inverted nipple, no mass, no nipple discharge, no skin change and no tenderness. Breasts are symmetrical.  Large pendulous breast       Assessment:     65 year old Black female returns to [facility] for annual screening.  Clinical breast exam unremarkable.  Taught self breast awareness.  Patient states she has vertigo and is being followed by her primary care provider for this.  Also states she is due for her 4th knee surgery.  Patient has been screened for eligibility.  She does not have any insurance, Medicare or Medicaid.  She also meets financial eligibility.  Hand-out given on the Affordable Care Act.     Plan:     Screening mammogram ordered.  Will follow-up per protocol.  Next pap due in 3 years.

## 2015-07-18 NOTE — Patient Instructions (Signed)
Gave patient hand-out, Women Staying Healthy, Active and Well from [facility], with education on breast health, pap smears, heart and colon health. 

## 2015-07-19 ENCOUNTER — Ambulatory Visit: Payer: No Typology Code available for payment source | Admitting: Orthopedic Surgery

## 2015-07-20 ENCOUNTER — Encounter: Payer: Self-pay | Admitting: *Deleted

## 2015-07-20 NOTE — Progress Notes (Signed)
Letter mailed from [facility] to inform patient of her normal mammogram results.  Patient is to follow-up with annual screening in one year.  HSIS to Christy. 

## 2015-07-21 ENCOUNTER — Telehealth: Payer: Self-pay | Admitting: Orthopedic Surgery

## 2015-07-21 ENCOUNTER — Other Ambulatory Visit: Payer: Self-pay | Admitting: *Deleted

## 2015-07-21 ENCOUNTER — Telehealth (HOSPITAL_COMMUNITY): Payer: Self-pay

## 2015-07-21 DIAGNOSIS — M545 Low back pain, unspecified: Secondary | ICD-10-CM

## 2015-07-21 DIAGNOSIS — M25561 Pain in right knee: Secondary | ICD-10-CM

## 2015-07-21 NOTE — Telephone Encounter (Signed)
Patient has 100% [facility] Discount and was given the number to call [facility] to schedule MRI's and was advised to call our office back  To schedule follow up appointment.

## 2015-07-21 NOTE — Telephone Encounter (Signed)
07/21/15 spoke to patient and she is going to see an ENT and wants to hold off on therapy.  She said that once she saw that dr then she would call us back if she still needed therapy.

## 2015-07-21 NOTE — Telephone Encounter (Signed)
Per Adline Potter, financial counselor, discount has been approved.  Patient aware.

## 2015-07-21 NOTE — Telephone Encounter (Signed)
Waiting to be rescheduled for MRI in [place] - needs open MRI machine as advised in previous phone call (believes she spoke with Arbie Cookey and someone else).  Patient has 100% [facility] discount through August 2017.  Please call ASAP with appointment date/time.

## 2015-07-29 ENCOUNTER — Ambulatory Visit
Admission: RE | Admit: 2015-07-29 | Discharge: 2015-07-29 | Disposition: A | Discharge status: Home / Self Care | Payer: No Typology Code available for payment source | Source: Ambulatory Visit | Attending: Orthopedic Surgery | Admitting: Orthopedic Surgery

## 2015-07-29 DIAGNOSIS — M25561 Pain in right knee: Secondary | ICD-10-CM

## 2015-07-29 DIAGNOSIS — M545 Low back pain, unspecified: Secondary | ICD-10-CM

## 2015-08-05 ENCOUNTER — Ambulatory Visit (INDEPENDENT_AMBULATORY_CARE_PROVIDER_SITE_OTHER): Payer: No Typology Code available for payment source | Admitting: Orthopedic Surgery

## 2015-08-05 VITALS — BP 136/83 | HR 96 | Ht 62.0 in | Wt 214.0 lb

## 2015-08-05 DIAGNOSIS — M129 Arthropathy, unspecified: Secondary | ICD-10-CM

## 2015-08-05 DIAGNOSIS — M1711 Unilateral primary osteoarthritis, right knee: Secondary | ICD-10-CM

## 2015-08-05 DIAGNOSIS — M545 Low back pain, unspecified: Secondary | ICD-10-CM

## 2015-08-05 MED ORDER — HYDROCODONE-ACETAMINOPHEN 5-325 MG PO TABS
1.0000 | ORAL_TABLET | Freq: Four times a day (QID) | ORAL | Status: DC | PRN
Start: 2015-08-05 — End: 2015-11-07

## 2015-08-05 MED ORDER — DICLOFENAC SODIUM 75 MG PO TBEC: 75.0000 mg | DELAYED_RELEASE_TABLET | Freq: Two times a day (BID) | ORAL | Status: DC

## 2015-08-05 NOTE — Progress Notes (Signed)
Patient ID: Shannon Gallegos, female   DOB: 1950/07/07, 65 y.o.   MRN: [id number]  MRI FOLLOW UP  Chief Complaint  Patient presents with  . Follow-up    Review MRI     HPI Shannon Gallegos is a 65 y.o. female.    MRI OF THE Lumbar spine and right knee   Review of Systems  Constitutional: Negative for fever and chills.  Musculoskeletal: Positive for back pain and joint pain.  Neurological: Positive for tingling and sensory change.    Physical Exam  Constitutional: She is oriented to person, place, and time. She appears well-developed and well-nourished. No distress.  Cardiovascular: Normal rate and intact distal pulses.   Neurological: She is alert and oriented to person, place, and time. She has normal reflexes. She exhibits normal muscle tone. Coordination normal.  Skin: Skin is warm and dry. No rash noted. She is not diaphoretic. No erythema. No pallor.  Psychiatric: She has a normal mood and affect. Her behavior is normal. Judgment and thought content normal.   Gait supported by a cane slight limp favoring right leg No new related findings.  Data  Independent image interpretation the MRI showed spinal stenosis at L4-5 on the right L3-4 on the left  Right knee showed simple arthritis with postop surgical changes from patellofemoral reconstruction  The report was read as follows  Knee  IMPRESSION: 1. Osteoarthritis, most severe laterally in the patellofemoral joint, with lateral patellar tilt and lateral patellar spurring, and with some focal synovitis along the lateral margin of the patella. Note is also made of a graft attaching the inferolateral patella to the medial margin of the tibial tubercle, with the intact graft passing below the native patellar tendon. 2. No meniscal or cruciate ligament tear is identified. 3. Mild edema tracks adjacent to the MCL. This can be incidental but in the appropriate clinical circumstance could represent grade 1 sprain. 4.  Degenerative subcortical cyst formation are geodes along the medial tibial rim. 5. Small knee effusion with small Baker's cyst and mild pes anserine bursitis.     Electronically Signed   By: Van Clines M.D.   On: 07/29/2015 09:18   Back IMPRESSION: Curvature convex to the right with the apex at L3.   Left lateral recess and foraminal narrowing at L3-4 that could be symptomatic. There is bilateral facet arthropathy with facet and ligamentous hypertrophy and 2 mm of anterolisthesis. There is bulging of the disc.   Right lateral recess and foraminal stenosis at L4-5 because of shallow disc herniation more prominent towards the right a combination with facet and ligamentous hypertrophy more prominent on the right.   Grossly non-compressive degenerative changes at L2-3 and L5-S1.     Electronically Signed   By: Nelson Chimes M.D.   On: 07/29/2015 09:02   The plan is to get a consult from neurosurgery for management of her ongoing back complaints  Follow-up 3 months or related to her right knee arthritis and left knee arthroscopy   Meds ordered this encounter  Medications  . HYDROcodone-acetaminophen (NORCO/VICODIN) 5-325 MG tablet    Sig: Take 1 tablet by mouth every 6 (six) hours as needed for moderate pain.    Dispense:  120 tablet    Refill:  0  . diclofenac (VOLTAREN) 75 MG EC tablet    Sig: Take 1 tablet (75 mg total) by mouth 2 (two) times daily.    Dispense:  60 tablet    Refill:  5

## 2015-08-05 NOTE — Patient Instructions (Signed)
Referral to neurosurgery for lumbar spinal stenosis  Follow-up visit for right knee arthritis in 3 months  Handicap sticker temporary

## 2015-08-08 NOTE — Addendum Note (Signed)
Addended by: Christia Reading on: 08/08/2015 08:38 AM   Modules accepted: Orders

## 2015-09-20 ENCOUNTER — Ambulatory Visit (HOSPITAL_COMMUNITY): Payer: No Typology Code available for payment source | Attending: Internal Medicine | Admitting: Physical Therapy

## 2015-09-20 DIAGNOSIS — H8112 Benign paroxysmal vertigo, left ear: Secondary | ICD-10-CM | POA: Insufficient documentation

## 2015-09-20 NOTE — Patient Instructions (Signed)
Movements: Eyes Only (Pictorial Reference)    Therapist: Use this card with Eye Exercises 14 through 17.   Copyright  VHI. All rights reserved.  Compensatory Strategies: Corrective Saccades    1. Holding two stationary targets placed _10___ inches apart, move eyes to target, keep head still. 2. Then move head in direction of target while eyes remain on target. 3/4. Repeat in opposite direction. Perform sitting. Repeat sequence _10___ times per session. Do _3___ sessions per day.  Copyright  VHI. All rights reserved.

## 2015-09-20 NOTE — Therapy (Signed)
[place], [place], [address] Phone: [phone]   Fax:  [phone]  Physical Therapy Evaluation  Patient Details  Name: Shannon Gallegos MRN: [id number] Date of Birth: 1950-07-30 Referring Provider: Abran Richard  Encounter Date: 09/20/2015      PT End of Session - 09/20/15 0957    Visit Number 1   Number of Visits 8   Date for PT Re-Evaluation 10/20/15   Authorization Type no insurance   PT Start Time 0815   PT Stop Time 0902   PT Time Calculation (min) 47 min   Activity Tolerance Treatment limited secondary to medical complications (Comment)   Behavior During Therapy Anxious      Past Medical History  Diagnosis Date  . Borderline diabetes   . Hypertension   . Hypercholesterolemia   . GERD (gastroesophageal reflux disease)   . Vertigo   . Arthritis     Past Surgical History  Procedure Laterality Date  . Knee surgery Bilateral     arthroscopies  . Tubal ligation    . Colonoscopy  March 2014    Dr. Ellender Hose: sigmoid and descending colon diverticula, ADENOMATOUS polyp. Due for surveillance 2019  . Esophagogastroduodenoscopy (egd) with esophageal dilation N/A 12/09/2012    Dr. Oneida Alar: small hiatal hernia, Schatzki's ring at GE junction s/p Savary dilation, benign sessile polyps, moderate gastritis  . Cataract extraction Bilateral   . Knee arthroscopy with medial menisectomy Left 04/28/2015    Procedure: LEFT KNEE ARTHROSCOPY WITH PARTIAL MEDIAL MENISECTOMY;  Surgeon: Carole Civil, MD;  Location: [facility];  Service: Orthopedics;  Laterality: Left;  . Breast biopsy Left 2000    neg    There were no vitals filed for this visit.       Subjective Assessment - 09/20/15 0813    Subjective Shannon Gallegos states that she has been having vertigo for years but in this past bout , which started March 7th, was worst than ever.  She states that after she takes the medication it normally goes away but this last  bout the medication did not help therefore she went to the MD>  The MD gave her a new medication which did not assist her at all.  She was so dizzy that she was throwing up.  She states that it did not matter what movement any movement made her dizzy.  She states that she is doing better but she still can not look up or down very fast.  Getting clothes out of the closet, putting lotion on , putting shoes on all affects her dizziness.   She has to stop for a moment prior to walking to make sure she is stable.  She has significant dizziness when she rolls to her left side.    Pertinent History vertigo, HTN,    How long can you sit comfortably? no problem as long as she is still    How long can you stand comfortably? needs to keep head still    How long can you walk comfortably? slow and keeps head still    Currently in Pain? No/denies  dizziness             [facility] PT Assessment - 09/20/15 0001    Assessment   Medical Diagnosis vertigo   Referring Provider Abran Richard   Onset Date/Surgical Date 05/31/15   Next MD Visit not scheduled    Precautions   Precautions None   Restrictions   Weight Bearing Restrictions  No   Balance Screen   Has the patient fallen in the past 6 months No   Has the patient had a decrease in activity level because of a fear of falling?  Yes   Is the patient reluctant to leave their home because of a fear of falling?  No   Prior Function   Level of Independence Independent   Vocation Unemployed   Leisure go to grandchildren's sports events            Vestibular Assessment - 09/20/15 0001    Vestibular Assessment   General Observation No issues noted    Symptom Behavior   Type of Dizziness Lightheadedness   Frequency of Dizziness 4 times a week    Duration of Dizziness 10-30 seconds    Aggravating Factors Activity in general;Looking up to the ceiling;Rolling to left   Relieving Factors Head stationary   Occulomotor Exam   Occulomotor Alignment Normal    Spontaneous Absent   Gaze-induced Absent   Head shaking Horizontal Absent   Head Shaking Vertical Absent   Smooth Pursuits Saccades   Saccades Intact   Positional Testing   Dix-Hallpike Dix-Hallpike Right;Dix-Hallpike Left   Dix-Hallpike Left   Dix-Hallpike Left Duration --  Pt would not tolerate due to sx becoming severe.    Dix-Hallpike Left Symptoms Left nystagmus   Cognition   Cognition Orientation Level Appropriate for developmental age   Positional Sensitivities   Sit to Supine Severe dizziness   Supine to Left Side Severe dizziness   Supine to Right Side Lightheadedness   Supine to Sitting Moderate dizziness   Right Hallpike Lightheadedness   Up from Right Hallpike Lightheadedness   Up from Left Hallpike Lightheadedness   Head Turning x 5 Moderate dizziness   Head Nodding x 5 Moderate dizziness   Rolling Right Mild dizziness   Rolling Left Severe dizziness                Vestibular Treatment/Exercise - 09/20/15 0001    Vestibular Treatment/Exercise   Vestibular Treatment Provided Canalith Repositioning;Habituation;Gaze   Canalith Repositioning Epley Manuever Left  attempted   Gaze Exercises Eye/Head Exercise Horizontal;Eye/Head Exercise Vertical    EPLEY MANUEVER LEFT   Number of Reps  1   Overall Response  Symptoms Worsened    RESPONSE DETAILS LEFT severe   Eye/Head Exercise Horizontal   Foot Position floor   Reps 5   Eye/Head Exercise Vertical   Reps 5               PT Education - 09/20/15 0956    Education provided Yes   Education Details eye gaze exercise; habituation for sitting to Lt sidelying and long sitting to supine with head turned Lt    Person(s) Educated Patient   Methods Explanation;Demonstration;Verbal cues;Handout   Comprehension Verbalized understanding;Returned demonstration          PT Short Term Goals - 09/20/15 1010    PT SHORT TERM GOAL #1   Title Pt to be able to look up into higher shelves in pantry without  having symptoms of dizziness   Time 2   Period Weeks   Status New   PT SHORT TERM GOAL #2   Title Pt to be able to look down to put lotion on her legs without symptoms of dizziness   Time 2   Period Weeks   Status New   PT SHORT TERM GOAL #3   Title Pt to be able to turn her head while  driving without having any symptoms of dizziness    Time 2   Period Weeks   Status New           PT Long Term Goals - 09/20/15 1012    PT LONG TERM GOAL #1   Title Pt to be able to come sit to stand without having symptoms of dizziness   Time 4   Period Weeks   Status New   PT LONG TERM GOAL #2   Title Pt to be able to come sitting to left side lying without having symptoms of dizziness   Time 4   Period Weeks   Status New   PT LONG TERM GOAL #3   Title Pt to be able to roll to her left side without having sx of dizziness    Time 4   Period Weeks   Status New               Plan - 09/20/15 [id number]    Clinical Impression Statement Ms. Biebel has been  referred for vertigo.  The pt has had vertigo on and off for years but normally medication took care of it.  Her most recent bout started in March and she has been on various medications which has improved but has not abolished her symptoms.  Ms. Kovack examination is postitive for Lt Hal-pike ,(symptoms were so severe that she could not stay in this position long enough  for the therapist to know how long her symptoms were going to last..)  Therapist attempted a canalith repostioning technique but again pt could not tolerate this.  Pt was given habituation homework to try and decrease symptoms so that she may go through [place] next treatment.    Rehab Potential Good   PT Frequency 2x / week   PT Duration 4 weeks   PT Treatment/Interventions ADLs/Self Care Home Management;Patient/family education;Manual techniques   PT Next Visit Plan Attempt canalith repostioning to Left,(start long sitting turning head to the left).  Standing head  turns, tandem stance.  Progress to higher balance activity as able    PT Home Exercise Plan given       Patient will benefit from skilled therapeutic intervention in order to improve the following deficits and impairments:  Decreased activity tolerance, Decreased balance, Dizziness, Decreased mobility  Visit Diagnosis: BPPV (benign paroxysmal positional vertigo), left - Plan: PT plan of care cert/re-cert     Problem List Patient Active Problem List   Diagnosis Date Noted  . Acute medial meniscus tear of left knee   . Arthritis of knee   . Right flank discomfort 06/04/2013  . Dyspepsia 04/23/2013  . Dry mouth 04/23/2013  . GERD (gastroesophageal reflux disease) 11/20/2012  . Dysphagia, unspecified(787.20) 11/20/2012  . Adenomatous polyp 11/20/2012    Rayetta Humphrey, PT CLT [phone] 09/20/2015, 10:20 AM  [facility] [address] [place], [place], [address] Phone: [phone]   Fax:  [phone]  Name: Shannon Gallegos MRN: [id number] Date of Birth: [birth year]

## 2015-09-22 ENCOUNTER — Telehealth (HOSPITAL_COMMUNITY): Payer: Self-pay

## 2015-09-22 NOTE — Telephone Encounter (Signed)
09/22/15 said she was unable to come 6/30

## 2015-09-23 ENCOUNTER — Encounter (HOSPITAL_COMMUNITY): Payer: No Typology Code available for payment source | Admitting: Physical Therapy

## 2015-10-05 ENCOUNTER — Ambulatory Visit (HOSPITAL_COMMUNITY): Payer: No Typology Code available for payment source | Attending: Internal Medicine

## 2015-10-05 DIAGNOSIS — R2681 Unsteadiness on feet: Secondary | ICD-10-CM | POA: Insufficient documentation

## 2015-10-05 DIAGNOSIS — H8112 Benign paroxysmal vertigo, left ear: Secondary | ICD-10-CM

## 2015-10-05 NOTE — Therapy (Signed)
[place], [place], [address] Phone: [phone]   Fax:  [phone]  Physical Therapy Treatment  Patient Details  Name: Shannon Gallegos MRN: [id number] Date of Birth: 1951/02/27 Referring Provider: Abran Richard  Encounter Date: 10/05/2015      PT End of Session - 10/05/15 0912    Visit Number 2   Number of Visits 8   Date for PT Re-Evaluation 10/20/15   Authorization Type no insurance   PT Start Time 0830   PT Stop Time 0908   PT Time Calculation (min) 38 min   Equipment Utilized During Treatment Gait belt   Activity Tolerance Patient tolerated treatment well   Behavior During Therapy [facility] for tasks assessed/performed      Past Medical History  Diagnosis Date  . Borderline diabetes   . Hypertension   . Hypercholesterolemia   . GERD (gastroesophageal reflux disease)   . Vertigo   . Arthritis     Past Surgical History  Procedure Laterality Date  . Knee surgery Bilateral     arthroscopies  . Tubal ligation    . Colonoscopy  March 2014    Dr. Ellender Hose: sigmoid and descending colon diverticula, ADENOMATOUS polyp. Due for surveillance 2019  . Esophagogastroduodenoscopy (egd) with esophageal dilation N/A 12/09/2012    Dr. Oneida Alar: small hiatal hernia, Schatzki's ring at GE junction s/p Savary dilation, benign sessile polyps, moderate gastritis  . Cataract extraction Bilateral   . Knee arthroscopy with medial menisectomy Left 04/28/2015    Procedure: LEFT KNEE ARTHROSCOPY WITH PARTIAL MEDIAL MENISECTOMY;  Surgeon: Carole Civil, MD;  Location: [facility];  Service: Orthopedics;  Laterality: Left;  . Breast biopsy Left 2000    neg    There were no vitals filed for this visit.      Subjective Assessment - 10/05/15 0835    Subjective (p) Pt reports she had to stay in bed following last session for one day.  Stated when she looks up she feels as though the dizziness might start.  Reports complaince with HEP    Pertinent History (p) vertigo, HTN,    Currently in Pain? (p) No/denies  no reports of dizziness            Vestibular Treatment/Exercise - 10/05/15 0001    Vestibular Treatment/Exercise   Vestibular Treatment Provided Habituation;Gaze   Gaze Exercises Eye/Head Exercise Horizontal;Eye/Head Exercise Vertical   Eye/Head Exercise Horizontal   Foot Position NBOS then tandem stance   Reps 10   Eye/Head Exercise Vertical   Foot Position NBOS then tandem stance   Reps 10            PT Short Term Goals - 09/20/15 1010    PT SHORT TERM GOAL #1   Title Pt to be able to look up into higher shelves in pantry without having symptoms of dizziness   Time 2   Period Weeks   Status New   PT SHORT TERM GOAL #2   Title Pt to be able to look down to put lotion on her legs without symptoms of dizziness   Time 2   Period Weeks   Status New   PT SHORT TERM GOAL #3   Title Pt to be able to turn her head while driving without having any symptoms of dizziness    Time 2   Period Weeks   Status New           PT Long Term Goals - 09/20/15  [place] #1   Title Pt to be able to come sit to stand without having symptoms of dizziness   Time 4   Period Weeks   Status New   PT LONG TERM GOAL #2   Title Pt to be able to come sitting to left side lying without having symptoms of dizziness   Time 4   Period Weeks   Status New   PT LONG TERM GOAL #3   Title Pt to be able to roll to her left side without having sx of dizziness    Time 4   Period Weeks   Status New               Plan - 10/05/15 0913    Clinical Impression Statement Pt late for apt today.  Reviewed goals, compolaince and assure correct technique with HEP and copy of eval given to pt.  Session focus on eye gaze habituation exercises with standing balance training.  No nystagmus noted through treatment thouigh pt presented high resistance to Lt eye gazes and increased unsteadiness with eye and head  movements to the Lt.  No reports of dizzy symptoms through session.     Rehab Potential Good   PT Frequency 2x / week   PT Duration 4 weeks   PT Treatment/Interventions ADLs/Self Care Home Management;Patient/family education;Manual techniques   PT Next Visit Plan Attempt canalith repostioning to Left,(start long sitting turning head to the left).  Standing head turns, tandem stance.  Progress to higher balance activity as able       Patient will benefit from skilled therapeutic intervention in order to improve the following deficits and impairments:  Decreased activity tolerance, Decreased balance, Dizziness, Decreased mobility  Visit Diagnosis: BPPV (benign paroxysmal positional vertigo), left     Problem List Patient Active Problem List   Diagnosis Date Noted  . Acute medial meniscus tear of left knee   . Arthritis of knee   . Right flank discomfort 06/04/2013  . Dyspepsia 04/23/2013  . Dry mouth 04/23/2013  . GERD (gastroesophageal reflux disease) 11/20/2012  . Dysphagia, unspecified(787.20) 11/20/2012  . Adenomatous polyp 11/20/2012   Ihor Austin, LPTA; [place]  Aldona Lento 10/05/2015, 9:19 AM  [place], [place], [address] Phone: [phone]   Fax:  [phone]  Name: Shannon Gallegos MRN: [id number] Date of Birth: [birth year]

## 2015-10-07 ENCOUNTER — Ambulatory Visit (HOSPITAL_COMMUNITY): Payer: No Typology Code available for payment source

## 2015-10-07 DIAGNOSIS — H8112 Benign paroxysmal vertigo, left ear: Secondary | ICD-10-CM

## 2015-10-07 NOTE — Therapy (Signed)
[place], [place], [address] Phone: [phone]   Fax:  [phone]  Physical Therapy Treatment  Patient Details  Name: Shannon Gallegos MRN: [id number] Date of Birth: 12-31-1950 Referring Provider: Abran Richard  Encounter Date: 10/07/2015      PT End of Session - 10/07/15 1854    Visit Number 3   Number of Visits 8   Date for PT Re-Evaluation 10/20/15   Authorization Type no insurance   PT Start Time 0910   PT Stop Time 0948   PT Time Calculation (min) 38 min   Equipment Utilized During Treatment Gait belt   Activity Tolerance Patient tolerated treatment well   Behavior During Therapy [facility] for tasks assessed/performed      Past Medical History  Diagnosis Date  . Borderline diabetes   . Hypertension   . Hypercholesterolemia   . GERD (gastroesophageal reflux disease)   . Vertigo   . Arthritis     Past Surgical History  Procedure Laterality Date  . Knee surgery Bilateral     arthroscopies  . Tubal ligation    . Colonoscopy  March 2014    Dr. Ellender Hose: sigmoid and descending colon diverticula, ADENOMATOUS polyp. Due for surveillance 2019  . Esophagogastroduodenoscopy (egd) with esophageal dilation N/A 12/09/2012    Dr. Oneida Alar: small hiatal hernia, Schatzki's ring at GE junction s/p Savary dilation, benign sessile polyps, moderate gastritis  . Cataract extraction Bilateral   . Knee arthroscopy with medial menisectomy Left 04/28/2015    Procedure: LEFT KNEE ARTHROSCOPY WITH PARTIAL MEDIAL MENISECTOMY;  Surgeon: Carole Civil, MD;  Location: [facility];  Service: Orthopedics;  Laterality: Left;  . Breast biopsy Left 2000    neg    There were no vitals filed for this visit.      Subjective Assessment - 10/07/15 0910    Subjective Pt reports she has had minimal dizziness, has been avoiding eye and head movements to the left.     Pertinent History vertigo, HTN,    Currently in Pain? No/denies  dizzy  range 0-2/10              Vestibular Treatment/Exercise - 10/07/15 0001    Vestibular Treatment/Exercise   Vestibular Treatment Provided Canalith Repositioning;Habituation;Gaze   Canalith Repositioning Epley Manuever Left   Habituation Exercises Standing Horizontal Head Turns;Standing Vertical Head Turns   Gaze Exercises Eye/Head Exercise Horizontal;Eye/Head Exercise Vertical    EPLEY MANUEVER LEFT   Number of Reps  1   Overall Response  Improved Symptoms   Eye/Head Exercise Horizontal   Foot Position Tandem stance initially on even surface then airex   Reps 10   Eye/Head Exercise Vertical   Foot Position Tandem stance initially on even surface then airex   Reps 10                 PT Short Term Goals - 09/20/15 1010    PT SHORT TERM GOAL #1   Title Pt to be able to look up into higher shelves in pantry without having symptoms of dizziness   Time 2   Period Weeks   Status New   PT SHORT TERM GOAL #2   Title Pt to be able to look down to put lotion on her legs without symptoms of dizziness   Time 2   Period Weeks   Status New   PT SHORT TERM GOAL #3   Title Pt to be able to turn her head while  driving without having any symptoms of dizziness    Time 2   Period Weeks   Status New           PT Long Term Goals - 09/20/15 1012    PT LONG TERM GOAL #1   Title Pt to be able to come sit to stand without having symptoms of dizziness   Time 4   Period Weeks   Status New   PT LONG TERM GOAL #2   Title Pt to be able to come sitting to left side lying without having symptoms of dizziness   Time 4   Period Weeks   Status New   PT LONG TERM GOAL #3   Title Pt to be able to roll to her left side without having sx of dizziness    Time 4   Period Weeks   Status New               Plan - 10/07/15 1854    Clinical Impression Statement Epley's manever complete for canalith repositioning to Lt with minimal reports of dizziness following 1-2/10 dizzy  level.  Session focus on improving eye gaze habituation exercises with standing balance training.  Pt with improved tolerance to look to the Lt and no noted nystagmus during balance training.  Min A with balance training on foam for safety.   Rehab Potential Good   PT Frequency 2x / week   PT Duration 4 weeks   PT Treatment/Interventions ADLs/Self Care Home Management;Patient/family education;Manual techniques   PT Next Visit Plan Attempt canalith repostioning to Left,(start long sitting turning head to the left).  Standing head turns, tandem stance.  Progress to higher balance activity as able       Patient will benefit from skilled therapeutic intervention in order to improve the following deficits and impairments:  Decreased activity tolerance, Decreased balance, Dizziness, Decreased mobility  Visit Diagnosis: BPPV (benign paroxysmal positional vertigo), left     Problem List Patient Active Problem List   Diagnosis Date Noted  . Acute medial meniscus tear of left knee   . Arthritis of knee   . Right flank discomfort 06/04/2013  . Dyspepsia 04/23/2013  . Dry mouth 04/23/2013  . GERD (gastroesophageal reflux disease) 11/20/2012  . Dysphagia, unspecified(787.20) 11/20/2012  . Adenomatous polyp 11/20/2012   Ihor Austin, LPTA; [place]  Aldona Lento 10/07/2015, 6:58 PM  [facility] [address] [place], [place], [address] Phone: [phone]   Fax:  [phone]  Name: Shannon Gallegos MRN: [id number] Date of Birth: 02-Oct-1950

## 2015-10-11 ENCOUNTER — Ambulatory Visit (HOSPITAL_COMMUNITY): Payer: No Typology Code available for payment source

## 2015-10-11 DIAGNOSIS — H8112 Benign paroxysmal vertigo, left ear: Secondary | ICD-10-CM

## 2015-10-11 NOTE — Therapy (Signed)
[facility] [address] [place], [place], [address] Phone: [phone]   Fax:  [phone]  Physical Therapy Treatment  Patient Details  Name: Shannon Gallegos MRN: [id number] Date of Birth: 12/27/1950 Referring Provider: Alvina Filbert  Encounter Date: 10/11/2015      PT End of Session - 10/11/15 0914    Visit Number 4   Number of Visits 8   Date for PT Re-Evaluation 10/20/15   Authorization Type no insurance   PT Start Time [phone]  pt 10 min late   PT Stop Time 0946   PT Time Calculation (min) 36 min   Equipment Utilized During Treatment Gait belt   Activity Tolerance Patient tolerated treatment well   Behavior During Therapy [facility] for tasks assessed/performed      Past Medical History  Diagnosis Date  . Borderline diabetes   . Hypertension   . Hypercholesterolemia   . GERD (gastroesophageal reflux disease)   . Vertigo   . Arthritis     Past Surgical History  Procedure Laterality Date  . Knee surgery Bilateral     arthroscopies  . Tubal ligation    . Colonoscopy  March 2014    Dr. Erma Heritage: sigmoid and descending colon diverticula, ADENOMATOUS polyp. Due for surveillance 2019  . Esophagogastroduodenoscopy (egd) with esophageal dilation N/A 12/09/2012    Dr. Darrick Penna: small hiatal hernia, Schatzki's ring at GE junction s/p Savary dilation, benign sessile polyps, moderate gastritis  . Cataract extraction Bilateral   . Knee arthroscopy with medial menisectomy Left 04/28/2015    Procedure: LEFT KNEE ARTHROSCOPY WITH PARTIAL MEDIAL MENISECTOMY;  Surgeon: Vickki Hearing, MD;  Location: [facility];  Service: Orthopedics;  Laterality: Left;  . Breast biopsy Left 2000    neg    There were no vitals filed for this visit.      Subjective Assessment - 10/11/15 0913    Subjective Pt reports no dizziness currently, continues to have problems when look up and lay on left.  feels she is making improvements though not totally gone     Pertinent History vertigo, HTN,    Currently in Pain? No/denies  0/10 dizzy scale            Vestibular Treatment/Exercise - 10/11/15 0001    Vestibular Treatment/Exercise   Vestibular Treatment Provided Canalith Repositioning;Habituation;Gaze   Canalith Repositioning Epley Manuever Left   Habituation Exercises Standing Horizontal Head Turns;Standing Vertical Head Turns   Gaze Exercises Eye/Head Exercise Horizontal;Eye/Head Exercise Vertical    EPLEY MANUEVER LEFT   Number of Reps  1   Overall Response  Improved Symptoms    RESPONSE DETAILS LEFT no symptoms following   Eye/Head Exercise Horizontal   Foot Position Tandem stance initially on even surface then airex   Reps 10   Eye/Head Exercise Vertical   Foot Position Tandem stance initially on even surface then airex   Reps 10            Balance Exercises - 10/11/15 0949    Balance Exercises: Standing   Tandem Stance Eyes open;3 reps;30 secs  eye habitation exercises in tandem stance   SLS Eyes open;3 reps;Time  Lt 14" Rt 13"   Tandem Gait Forward;2 reps  2nd with head turns             PT Short Term Goals - 09/20/15 1010    PT SHORT TERM GOAL #1   Title Pt to be able to look up into higher shelves in pantry  without having symptoms of dizziness   Time 2   Period Weeks   Status New   PT SHORT TERM GOAL #2   Title Pt to be able to look down to put lotion on her legs without symptoms of dizziness   Time 2   Period Weeks   Status New   PT SHORT TERM GOAL #3   Title Pt to be able to turn her head while driving without having any symptoms of dizziness    Time 2   Period Weeks   Status New           PT Long Term Goals - 09/20/15 1012    PT LONG TERM GOAL #1   Title Pt to be able to come sit to stand without having symptoms of dizziness   Time 4   Period Weeks   Status New   PT LONG TERM GOAL #2   Title Pt to be able to come sitting to left side lying without having symptoms of dizziness   Time 4    Period Weeks   Status New   PT LONG TERM GOAL #3   Title Pt to be able to roll to her left side without having sx of dizziness    Time 4   Period Weeks   Status New               Plan - 10/11/15 1613    Clinical Impression Statement Pt late for apt today.  Continues Epley's manever for canalith repositioning with Lt with no reports of dizziness following manual.  Pt demonstrated increase ease with ability to look up toward ceiling following.  Progressed standing balance activitries with tandem gait and dynamic surfaces with cueing to improve spatial awareness to assist with balance.     Rehab Potential Good   PT Frequency 2x / week   PT Duration 4 weeks   PT Treatment/Interventions ADLs/Self Care Home Management;Patient/family education;Manual techniques   PT Next Visit Plan Attempt canalith repostioning to Left,(start long sitting turning head to the left).  Standing head turns, tandem stance.  Progress to higher balance activity as able       Patient will benefit from skilled therapeutic intervention in order to improve the following deficits and impairments:  Decreased activity tolerance, Decreased balance, Dizziness, Decreased mobility  Visit Diagnosis: BPPV (benign paroxysmal positional vertigo), left     Problem List Patient Active Problem List   Diagnosis Date Noted  . Acute medial meniscus tear of left knee   . Arthritis of knee   . Right flank discomfort 06/04/2013  . Dyspepsia 04/23/2013  . Dry mouth 04/23/2013  . GERD (gastroesophageal reflux disease) 11/20/2012  . Dysphagia, unspecified(787.20) 11/20/2012  . Adenomatous polyp 11/20/2012   Ihor Austin, LPTA; [place]  Aldona Lento 10/11/2015, 5:24 PM  [place] [address] [place], [place], [address] Phone: [phone]   Fax:  [phone]  Name: JOSHLYNN MOOS MRN: [id number] Date of Birth: 08/05/1950

## 2015-10-13 ENCOUNTER — Ambulatory Visit (HOSPITAL_COMMUNITY): Payer: No Typology Code available for payment source | Admitting: Physical Therapy

## 2015-10-13 DIAGNOSIS — H8112 Benign paroxysmal vertigo, left ear: Secondary | ICD-10-CM

## 2015-10-13 NOTE — Therapy (Signed)
[place], [place], [address] Phone: [phone]   Fax:  [phone]  Physical Therapy Treatment  Patient Details  Name: Shannon Gallegos MRN: [id number] Date of Birth: October 28, 1950 Referring Provider: Abran Richard  Encounter Date: 10/13/2015      PT End of Session - 10/13/15 0903    Visit Number 5   Number of Visits 8   Date for PT Re-Evaluation 10/20/15   Authorization Type no insurance   Authorization - Visit Number 5   Authorization - Number of Visits 8   PT Start Time 0818   PT Stop Time 0859   PT Time Calculation (min) 41 min   Equipment Utilized During Treatment Gait belt   Activity Tolerance Patient tolerated treatment well   Behavior During Therapy [facility] for tasks assessed/performed      Past Medical History  Diagnosis Date  . Borderline diabetes   . Hypertension   . Hypercholesterolemia   . GERD (gastroesophageal reflux disease)   . Vertigo   . Arthritis     Past Surgical History  Procedure Laterality Date  . Knee surgery Bilateral     arthroscopies  . Tubal ligation    . Colonoscopy  March 2014    Dr. Ellender Hose: sigmoid and descending colon diverticula, ADENOMATOUS polyp. Due for surveillance 2019  . Esophagogastroduodenoscopy (egd) with esophageal dilation N/A 12/09/2012    Dr. Oneida Alar: small hiatal hernia, Schatzki's ring at GE junction s/p Savary dilation, benign sessile polyps, moderate gastritis  . Cataract extraction Bilateral   . Knee arthroscopy with medial menisectomy Left 04/28/2015    Procedure: LEFT KNEE ARTHROSCOPY WITH PARTIAL MEDIAL MENISECTOMY;  Surgeon: Carole Civil, MD;  Location: [facility];  Service: Orthopedics;  Laterality: Left;  . Breast biopsy Left 2000    neg    There were no vitals filed for this visit.      Subjective Assessment - 10/13/15 0820    Subjective Pt states that she has not noticed much difference. Pt states that her vertigo is really on the left    Pertinent History vertigo, HTN,    How long can you sit comfortably? no problem as long as she is still    How long can you stand comfortably? needs to keep head still    How long can you walk comfortably? slow and keeps head still    Currently in Pain? No/denies               [facility] Adult PT Treatment/Exercise - 10/13/15 0001    Exercises   Exercises Neck   Neck Exercises: Seated   Neck Retraction 5 reps   Cervical Rotation 5 reps   Lateral Flexion 5 reps   Other Seated Exercise scapular retraction x 5    Manual Therapy   Manual Therapy Soft tissue mobilization   Manual therapy comments all manual done seperate from all other aspects of treatment.    Soft tissue mobilization to decrease spasms in trapezius mm bilaterally         Vestibular Treatment/Exercise - 10/13/15 0001    Vestibular Treatment/Exercise   Vestibular Treatment Provided Canalith Repositioning   Canalith Repositioning Epley Manuever Left    EPLEY MANUEVER LEFT   Number of Reps  2                 PT Short Term Goals - 10/13/15 1027    PT SHORT TERM GOAL #1   Title Pt to be able  to look up into higher shelves in pantry without having symptoms of dizziness   Time 2   Period Weeks   Status On-going   PT SHORT TERM GOAL #2   Title Pt to be able to look down to put lotion on her legs without symptoms of dizziness   Time 2   Period Weeks   Status On-going   PT SHORT TERM GOAL #3   Title Pt to be able to turn her head while driving without having any symptoms of dizziness    Time 2   Period Weeks   Status On-going           PT Long Term Goals - 10/13/15 1027    PT LONG TERM GOAL #1   Title Pt to be able to come sit to stand without having symptoms of dizziness   Time 4   Period Weeks   Status On-going   PT LONG TERM GOAL #2   Title Pt to be able to come sitting to left side lying without having symptoms of dizziness   Time 4   Period Weeks   Status On-going   PT LONG TERM GOAL  #3   Title Pt to be able to roll to her left side without having sx of dizziness    Time 4   Period Weeks   Status On-going               Plan - 10/13/15 1024    Clinical Impression Statement Pt feels that at times she is doing better but then she turns her head and the symptoms are back.  Pt is unable to keep eyes open during Hal pike-Dix manuever therefore therapist is taking an educated guess that the posteriior canal is affected.  Pt symptoms were significantly decreased after the first cannalth repositioning.    Rehab Potential Good   PT Frequency 2x / week   PT Duration 4 weeks   PT Treatment/Interventions ADLs/Self Care Home Management;Patient/family education;Manual techniques   PT Next Visit Plan Begin anterior repositioning if posterior did not alleviate symptoms.  Begin balanc activity ie walking on foam balance beam. Continue with manual to cervical musculature.       Patient will benefit from skilled therapeutic intervention in order to improve the following deficits and impairments:  Decreased activity tolerance, Decreased balance, Dizziness, Decreased mobility  Visit Diagnosis: BPPV (benign paroxysmal positional vertigo), left     Problem List Patient Active Problem List   Diagnosis Date Noted  . Acute medial meniscus tear of left knee   . Arthritis of knee   . Right flank discomfort 06/04/2013  . Dyspepsia 04/23/2013  . Dry mouth 04/23/2013  . GERD (gastroesophageal reflux disease) 11/20/2012  . Dysphagia, unspecified(787.20) 11/20/2012  . Adenomatous polyp 11/20/2012   Rayetta Humphrey, PT CLT [phone] 10/13/2015, 10:28 AM  [place] [address] [place], [place], [address] Phone: [phone]   Fax:  [phone]  Name: Shannon Gallegos MRN: [id number] Date of Birth: May 12, 1950

## 2015-10-18 ENCOUNTER — Ambulatory Visit (HOSPITAL_COMMUNITY): Payer: No Typology Code available for payment source | Admitting: Physical Therapy

## 2015-10-18 DIAGNOSIS — H8112 Benign paroxysmal vertigo, left ear: Secondary | ICD-10-CM

## 2015-10-18 DIAGNOSIS — R2681 Unsteadiness on feet: Secondary | ICD-10-CM

## 2015-10-18 NOTE — Therapy (Signed)
[place], [place], [address] Phone: [phone]   Fax:  [phone]  Physical Therapy Treatment  Patient Details  Name: Shannon Gallegos MRN: [id number] Date of Birth: 06-09-1950 Referring Provider: Abran Richard  Encounter Date: 10/18/2015      PT End of Session - 10/18/15 0910    Visit Number 6   Number of Visits 8   Date for PT Re-Evaluation 10/20/15   Authorization Type no insurance   Authorization - Visit Number 6   Authorization - Number of Visits 8   Equipment Utilized During Treatment Gait belt   Activity Tolerance Patient tolerated treatment well   Behavior During Therapy [facility] for tasks assessed/performed      Past Medical History:  Diagnosis Date  . Arthritis   . Borderline diabetes   . GERD (gastroesophageal reflux disease)   . Hypercholesterolemia   . Hypertension   . Vertigo     Past Surgical History:  Procedure Laterality Date  . BREAST BIOPSY Left 2000   neg  . CATARACT EXTRACTION Bilateral   . COLONOSCOPY  March 2014   Dr. Ellender Hose: sigmoid and descending colon diverticula, ADENOMATOUS polyp. Due for surveillance 2019  . ESOPHAGOGASTRODUODENOSCOPY (EGD) WITH ESOPHAGEAL DILATION N/A 12/09/2012   Dr. Oneida Alar: small hiatal hernia, Schatzki's ring at GE junction s/p Savary dilation, benign sessile polyps, moderate gastritis  . KNEE ARTHROSCOPY WITH MEDIAL MENISECTOMY Left 04/28/2015   Procedure: LEFT KNEE ARTHROSCOPY WITH PARTIAL MEDIAL MENISECTOMY;  Surgeon: Carole Civil, MD;  Location: [facility];  Service: Orthopedics;  Laterality: Left;  . KNEE SURGERY Bilateral    arthroscopies  . TUBAL LIGATION      There were no vitals filed for this visit.      Subjective Assessment - 10/18/15 0831    Subjective Pt states that she can tell that she is slowly improving but she is still very cautious.    Pertinent History vertigo, HTN,    How long can you sit comfortably? no problem as long as  she is still    How long can you stand comfortably? needs to keep head still    How long can you walk comfortably? slow and keeps head still                 Vestibular Assessment - 10/18/15 0001      Positional Testing   Dix-Hallpike Dix-Hallpike Left     Dix-Hallpike Left   Dix-Hallpike Left Duration --  no sx except upon return to sitting position then extreme sx                 [facility] Adult PT Treatment/Exercise - 10/18/15 0001      Manual Therapy   Manual Therapy Soft tissue mobilization   Manual therapy comments all manual done seperate from all other aspects of treatment.    Soft tissue mobilization to decrease spasms in trapezius mm bilaterally         Vestibular Treatment/Exercise - 10/18/15 0001      Vestibular Treatment/Exercise   Vestibular Treatment Provided Habituation  supine to sit:  Pt became nauseated and vomitted   Canalith Repositioning --      EPLEY MANUEVER LEFT   Number of Reps  1            Balance Exercises - 10/18/15 0829      Balance Exercises: Standing   Standing Eyes Closed Narrow base of support (BOS);Foam/compliant surface;5 reps  head turns  x 5; no head turns hold x 1 minute    Balance Beam x2  with head turns   Marching Limitations on foam x 10              PT Short Term Goals - 10/18/15 0913      PT SHORT TERM GOAL #1   Title Pt to be able to look up into higher shelves in pantry without having symptoms of dizziness   Time 2   Period Weeks   Status On-going     PT SHORT TERM GOAL #2   Title Pt to be able to look down to put lotion on her legs without symptoms of dizziness   Time 2   Period Weeks   Status On-going     PT SHORT TERM GOAL #3   Title Pt to be able to turn her head while driving without having any symptoms of dizziness    Time 2   Period Weeks   Status On-going           PT Long Term Goals - 10/18/15 0913      PT LONG TERM GOAL #1   Title Pt to be able to come sit to  stand without having symptoms of dizziness   Time 4   Period Weeks   Status On-going     PT LONG TERM GOAL #2   Title Pt to be able to come sitting to left side lying without having symptoms of dizziness   Time 4   Period Weeks   Status On-going     PT LONG TERM GOAL #3   Title Pt to be able to roll to her left side without having sx of dizziness    Time 4   Period Weeks   Status On-going               Plan - 10/18/15 0910    Clinical Impression Statement Pt felt she was doing better.  Therapist completed a Lt hal pike-Dix manuever with no response sitting to supine but severe response coming from supine back to long sitting.   Pt did not want to attempt cannalith repositioning.  Pt given ice pack to neck; water and time to reduce sx.    Rehab Potential Good   PT Frequency 2x / week   PT Duration 4 weeks   PT Treatment/Interventions ADLs/Self Care Home Management;Patient/family education;Manual techniques   PT Next Visit Plan Begin anterior repositioning       Patient will benefit from skilled therapeutic intervention in order to improve the following deficits and impairments:  Decreased activity tolerance, Decreased balance, Dizziness, Decreased mobility  Visit Diagnosis: Unsteadiness on feet  BPPV (benign paroxysmal positional vertigo), left     Problem List Patient Active Problem List   Diagnosis Date Noted  . Acute medial meniscus tear of left knee   . Arthritis of knee   . Right flank discomfort 06/04/2013  . Dyspepsia 04/23/2013  . Dry mouth 04/23/2013  . GERD (gastroesophageal reflux disease) 11/20/2012  . Dysphagia, unspecified(787.20) 11/20/2012  . Adenomatous polyp 11/20/2012   Rayetta Humphrey, PT CLT [phone] 10/18/2015, 9:16 AM  [place] [address] [place], [place], [address] Phone: [phone]   Fax:  [phone]  Name: Shannon Gallegos MRN: [id number] Date of Birth: [birth year]

## 2015-10-20 ENCOUNTER — Telehealth (HOSPITAL_COMMUNITY): Payer: Self-pay

## 2015-10-20 ENCOUNTER — Ambulatory Visit (HOSPITAL_COMMUNITY): Payer: No Typology Code available for payment source | Admitting: Physical Therapy

## 2015-10-25 ENCOUNTER — Ambulatory Visit (HOSPITAL_COMMUNITY): Payer: Self-pay | Attending: Internal Medicine | Admitting: Physical Therapy

## 2015-10-25 DIAGNOSIS — H8112 Benign paroxysmal vertigo, left ear: Secondary | ICD-10-CM

## 2015-10-25 DIAGNOSIS — R2681 Unsteadiness on feet: Secondary | ICD-10-CM

## 2015-10-25 NOTE — Therapy (Addendum)
[place], [place], [address] Phone: [phone]   Fax:  [phone]  Physical Therapy Treatment  Patient Details  Name: Shannon Gallegos MRN: [id number] Date of Birth: [birth year] Referring Provider: Abran Richard   Encounter Date: 10/25/2015      PT End of Session - 10/25/15 1019    Visit Number 7   Number of Visits 7   Date for PT Re-Evaluation 10/20/15   Authorization Type no insurance   Authorization - Visit Number 7   Authorization - Number of Visits 7   PT Start Time 0945   PT Stop Time 1030   PT Time Calculation (min) 45 min   Activity Tolerance Patient tolerated treatment well   Behavior During Therapy [facility] for tasks assessed/performed      Past Medical History:  Diagnosis Date  . Arthritis   . Borderline diabetes   . GERD (gastroesophageal reflux disease)   . Hypercholesterolemia   . Hypertension   . Vertigo     Past Surgical History:  Procedure Laterality Date  . BREAST BIOPSY Left 2000   neg  . CATARACT EXTRACTION Bilateral   . COLONOSCOPY  March 2014   Dr. Ellender Hose: sigmoid and descending colon diverticula, ADENOMATOUS polyp. Due for surveillance 2019  . ESOPHAGOGASTRODUODENOSCOPY (EGD) WITH ESOPHAGEAL DILATION N/A 12/09/2012   Dr. Oneida Alar: small hiatal hernia, Schatzki's ring at GE junction s/p Savary dilation, benign sessile polyps, moderate gastritis  . KNEE ARTHROSCOPY WITH MEDIAL MENISECTOMY Left 04/28/2015   Procedure: LEFT KNEE ARTHROSCOPY WITH PARTIAL MEDIAL MENISECTOMY;  Surgeon: Carole Civil, MD;  Location: [facility];  Service: Orthopedics;  Laterality: Left;  . KNEE SURGERY Bilateral    arthroscopies  . TUBAL LIGATION      There were no vitals filed for this visit.      Subjective Assessment - 10/25/15 0954    Subjective Pt states that after last treatment she was in bed for two days.  States that she is about back to square one.   When she goes to look for her spices she  has problems.  If she lies on the Left side she immediately wakes up.     Pertinent History vertigo, HTN,    How long can you sit comfortably? no problem as long as she is still    How long can you stand comfortably? needs to keep head still    How long can you walk comfortably? slow and keeps head still    Currently in Pain? No/denies  only dizziness             [facility] PT Assessment - 10/25/15 0001      Assessment   Medical Diagnosis vertigo   Referring Provider Abran Richard    Onset Date/Surgical Date 05/31/15   Next MD Visit not scheduled      Precautions   Precautions None     Restrictions   Weight Bearing Restrictions No     Balance Screen   Has the patient fallen in the past 6 months No   Has the patient had a decrease in activity level because of a fear of falling?  Yes   Is the patient reluctant to leave their home because of a fear of falling?  No     Prior Function   Level of Independence Independent   Vocation Unemployed   Leisure go to grandchildren's sports events     Cognition   Overall Cognitive Status Within Functional Limits  for tasks assessed            Vestibular Assessment - 10/25/15 0001      Vestibular Assessment   General Observation No issues noted      Symptom Behavior   Type of Dizziness Spinning   Frequency of Dizziness 4 times a week    Duration of Dizziness 10-30 seconds    Aggravating Factors Activity in general;Looking up to the ceiling;Rolling to left   Relieving Factors Head stationary     Occulomotor Exam   Occulomotor Alignment Normal   Spontaneous Absent   Gaze-induced Left beating nystagmus with L gaze   Head shaking Horizontal Absent   Head Shaking Vertical Absent   Smooth Pursuits Saccades   Saccades Intact     Vestibulo-Occular Reflex   Comment horizontal and vertical eye motion Lt eye nystagmus minimal :  lightheadedness      Positional Testing   Dix-Hallpike Dix-Hallpike Right;Dix-Hallpike Left      Dix-Hallpike Left   Dix-Hallpike Left Duration --  Pt would not tolerate due to sx becoming severe.    Dix-Hallpike Left Symptoms Left nystagmus     Cognition   Cognition Orientation Level Appropriate for developmental age     Positional Sensitivities   Sit to Supine Severe dizziness   Supine to Left Side Severe dizziness   Supine to Right Side Lightheadedness   Supine to Sitting Moderate dizziness   Right Hallpike Lightheadedness   Up from Right Hallpike Lightheadedness   Up from Left Hallpike Lightheadedness   Head Turning x 5 Moderate dizziness   Head Nodding x 5 Moderate dizziness   Rolling Right Mild dizziness   Rolling Left Severe dizziness                  Vestibular Treatment/Exercise - 10/25/15 0001      Vestibular Treatment/Exercise   Vestibular Treatment Provided Habituation   Habituation Exercises Horizontal Roll   Gaze Exercises X1 Viewing Horizontal;X1 Viewing Vertical     X1 Viewing Horizontal   Foot Position flat on floor   Reps 10     X1 Viewing Vertical   Foot Position flat of floor   Comments 10     Eye/Head Exercise Horizontal   Foot Position sitting flat on floor   Time --   Reps 10     Eye/Head Exercise Vertical   Foot Position sitting flat on floor    Time --   Reps 10               PT Education - 10/25/15 1018    Education provided Yes   Education Details New vestibular exercises   Person(s) Educated Patient   Methods Explanation;Verbal cues;Tactile cues;Demonstration;Handout   Comprehension Verbalized understanding          PT Short Term Goals - 10/25/15 1022      PT SHORT TERM GOAL #1   Title Pt to be able to look up into higher shelves in pantry without having symptoms of dizziness   Time 2   Period Weeks   Status Not Met     PT SHORT TERM GOAL #2   Title Pt to be able to look down to put lotion on her legs without symptoms of dizziness   Time 2   Period Weeks   Status Not Met     PT SHORT TERM GOAL  #3   Title Pt to be able to turn her head while driving without having any symptoms of dizziness  Time 2   Period Weeks   Status Partially Met           PT Long Term Goals - 10/25/15 1022      PT LONG TERM GOAL #1   Title Pt to be able to come sit to stand without having symptoms of dizziness   Time 4   Period Weeks   Status Not Met     PT LONG TERM GOAL #2   Title Pt to be able to come sitting to left side lying without having symptoms of dizziness   Time 4   Period Weeks   Status Not Met     PT LONG TERM GOAL #3   Title Pt to be able to roll to her left side without having sx of dizziness    Time 4   Period Weeks   Status Not Met               Plan - 10/25/15 1020    Clinical Impression Statement Pt hesitant to have cannalith repostioning after last treatment.  Pt worked on eye and head motion while sitting with mild sx.  Pt has self cannalith repostioning and was given a HEP as pt feels she needs to return to MD and does not feel sx have improved after all.    Rehab Potential Good   PT Frequency 2x / week   PT Duration 4 weeks   PT Treatment/Interventions ADLs/Self Care Home Management;Patient/family education;Manual techniques   PT Next Visit Plan Discharge pt to HEP       Patient will benefit from skilled therapeutic intervention in order to improve the following deficits and impairments:  Decreased activity tolerance, Decreased balance, Dizziness, Decreased mobility  Visit Diagnosis: Unsteadiness on feet  BPPV (benign paroxysmal positional vertigo), left     Problem List Patient Active Problem List   Diagnosis Date Noted  . Acute medial meniscus tear of left knee   . Arthritis of knee   . Right flank discomfort 06/04/2013  . Dyspepsia 04/23/2013  . Dry mouth 04/23/2013  . GERD (gastroesophageal reflux disease) 11/20/2012  . Dysphagia, unspecified(787.20) 11/20/2012  . Adenomatous polyp 11/20/2012    Rayetta Humphrey, PT  CLT [phone] 10/25/2015, 10:31 AM  [place], [place], [address] Phone: [phone]   Fax:  [phone]  Name: Shannon Gallegos MRN: [id number] Date of Birth: 27-Nov-1950   PHYSICAL THERAPY DISCHARGE SUMMARY  Visits from Start of Care: 7  Current functional level related to goals / functional outcomes: same   Remaining deficits: Continued dizziness with head motion especially to the left   Education / Equipment: HEP for habituation as well as canalith repositioning  Plan: Patient agrees to discharge.  Patient goals were not met. Patient is being discharged due to lack of progress.  ?????  Rayetta Humphrey, [place] CLT [phone]

## 2015-10-25 NOTE — Patient Instructions (Addendum)
Gaze Stabilization: Sitting    Keeping eyes on target on wall   feet away, tilt head down 15-30 and move head side to side for _30___ seconds. Repeat while moving head up and down for _30___ seconds. Do ___4_ sessions per day. Repeat using target on pattern background.  Copyright  VHI. All rights reserved.  Movements: Head / Eyes (Pictorial Reference)    Tilt head down 15-30. Eyes fixed on target, head moves opposite direction of moving target: ________________. Eyes fixed on target, head moves same direction as moving target. Therapist: Use this card with Eye Exercises 14 and 15.Head Motion Side to Side    With feet flat on floor, hands resting on lap, move head to right and left. Repeat _10___ times per session. Do ___1_ sessions per day. Repeat on compliant surface: ___4_____.  Copyright  VHI. All rights reserved.  Head Motion Up / Down    With feet flat on floor, hands resting on lap, move head up and down. Repeat _10__ times per session. Do ___1_ sessions per day. Repeat on compliant surface: ________. 4 Copyright  VHI. All rights reserved.  Trunk Rotation    Sit with feet flat on floor, hands clasped out in front. Rotate trunk toward right side. Hold __2__ seconds. Repeat _10___ times per session. Do _4___ sessions per day. Repeat to opposite side. Repeat on compliant surface: ________.  Copyright  VHI. All rights reserved.

## 2015-10-28 NOTE — Telephone Encounter (Signed)
cx - just not feeling well and been in bed since last visit

## 2015-11-07 ENCOUNTER — Ambulatory Visit (INDEPENDENT_AMBULATORY_CARE_PROVIDER_SITE_OTHER): Payer: Self-pay | Admitting: Orthopedic Surgery

## 2015-11-07 ENCOUNTER — Encounter: Payer: Self-pay | Admitting: Orthopedic Surgery

## 2015-11-07 VITALS — BP 120/66 | Ht 64.0 in | Wt 210.0 lb

## 2015-11-07 DIAGNOSIS — Z9889 Other specified postprocedural states: Secondary | ICD-10-CM

## 2015-11-07 DIAGNOSIS — M545 Low back pain, unspecified: Secondary | ICD-10-CM

## 2015-11-07 DIAGNOSIS — M129 Arthropathy, unspecified: Secondary | ICD-10-CM

## 2015-11-07 DIAGNOSIS — M1711 Unilateral primary osteoarthritis, right knee: Secondary | ICD-10-CM

## 2015-11-07 MED ORDER — DICLOFENAC SODIUM 75 MG PO TBEC: 75.0000 mg | DELAYED_RELEASE_TABLET | Freq: Two times a day (BID) | ORAL | 5 refills | Status: DC

## 2015-11-07 MED ORDER — HYDROCODONE-ACETAMINOPHEN 5-325 MG PO TABS: 1.0000 | ORAL_TABLET | Freq: Four times a day (QID) | ORAL | 0 refills | Status: DC | PRN

## 2015-11-07 NOTE — Progress Notes (Signed)
Subjective:     Patient ID: Shannon Gallegos, female   DOB: 1950-08-22, 65 y.o.   MRN: [id number]  HPI  status post arthroscopy left knee patient has osteoarthritis both knees has bilateral back pain.  She is doing well with hydrocodone 5 mg, diclofenac and gabapentin to control her symptoms.  Review of Systems  Constitutional: Negative for appetite change, chills and unexpected weight change.  Musculoskeletal: Positive for back pain.       Objective:   Physical Exam  Constitutional: She is oriented to person, place, and time. She appears well-developed and well-nourished. No distress.  Neurological: She is alert and oriented to person, place, and time. She has normal reflexes. She exhibits normal muscle tone. Coordination normal.  Skin: Skin is warm. No rash noted. She is diaphoretic. No erythema. No pallor.  Psychiatric: She has a normal mood and affect. Her behavior is normal. Judgment and thought content normal.   Left knee no swelling no tenderness flexion 120 strength normal  Right knee weakness in the right quad crepitance in the patellofemoral joint mild swelling mild effusion    Assessment:     Encounter Diagnoses  Name Primary?  . Bilateral low back pain without sciatica   . Arthritis of knee, right Yes  . S/P left knee arthroscopy        Plan:     Stable continue current medication opioid warnings have been given follow-up 6 months

## 2015-11-07 NOTE — Patient Instructions (Signed)
Please be advised. You are on a medication which is classified as an "opiod". The CDC the [facility]  has recently advised all providers to advise patient's that these medications have certain risks which include but are not limited to:    drug intolerance  drug addiction  respiratory depression   respiratory failure  Death  Please keep these medications locked away. If you feel that you are becoming addicted to these medicines or you are having difficulties with these medications please alert your provider.

## 2015-12-15 ENCOUNTER — Ambulatory Visit: Payer: Self-pay | Admitting: Gastroenterology

## 2016-01-19 ENCOUNTER — Ambulatory Visit: Payer: Self-pay | Admitting: Gastroenterology

## 2016-01-26 ENCOUNTER — Ambulatory Visit (INDEPENDENT_AMBULATORY_CARE_PROVIDER_SITE_OTHER): Payer: Self-pay | Admitting: Otolaryngology

## 2016-02-10 IMAGING — MR MR KNEE*L* W/O CM
4 of 6 series · 12 of 40 positions shown · non-contrast
Comparison: 03/24/2015

CLINICAL DATA: Left knee pain for 6 months. Prior arthroscopy 4999.

EXAM:
MRI OF THE LEFT KNEE WITHOUT CONTRAST
TECHNIQUE: Multiplanar, multisequence MR imaging of the knee was performed. No
intravenous contrast was administered.

[Series 4: pdfs axial · axial · 3.0mm · 0.22mm/px · z∈[-72,-0]mm · 3 of 30 slices shown]
[im 5/30]
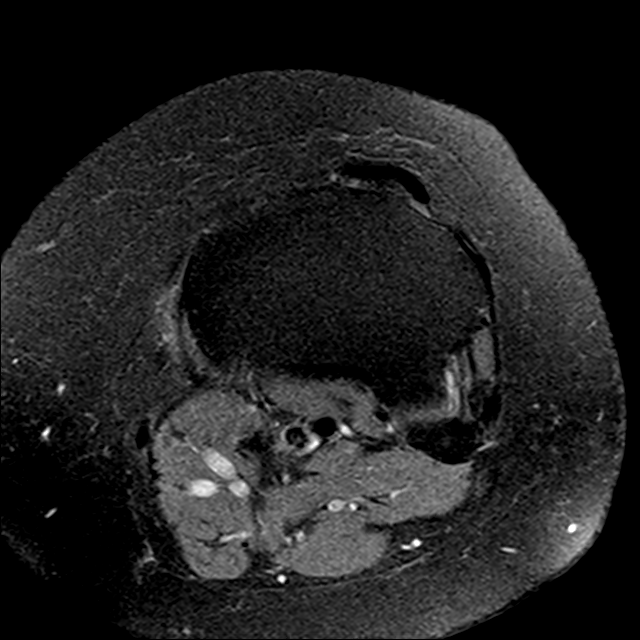
[im 15/30]
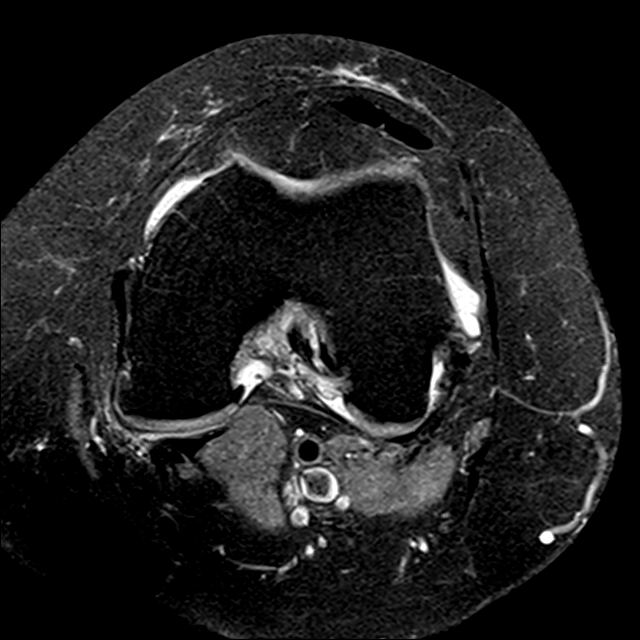
[im 25/30]
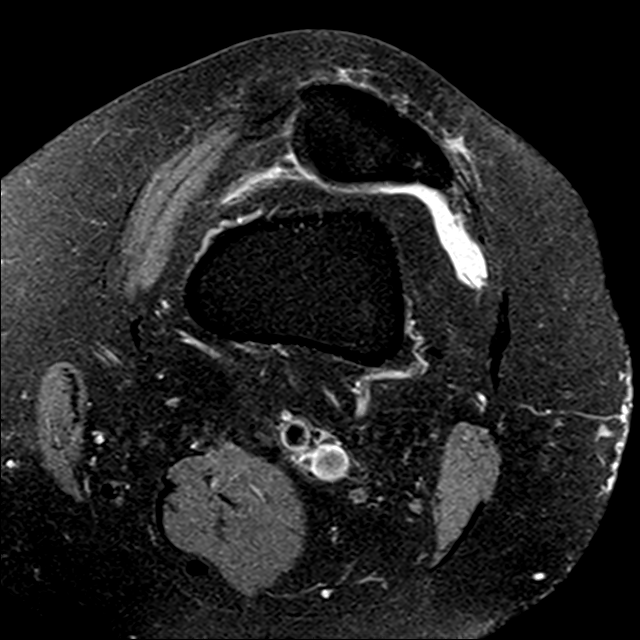

[Series 5: T1 · coronal · 3.0mm · 0.23mm/px · 3 of 24 slices shown]
[im 4/24]
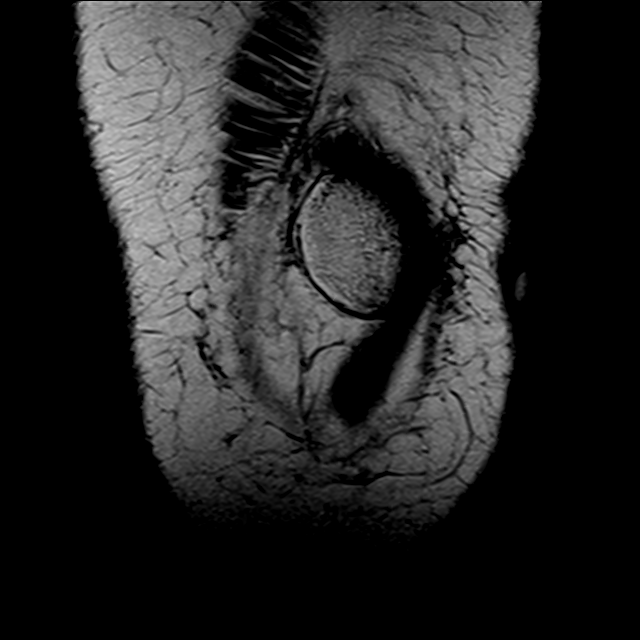
[im 12/24]
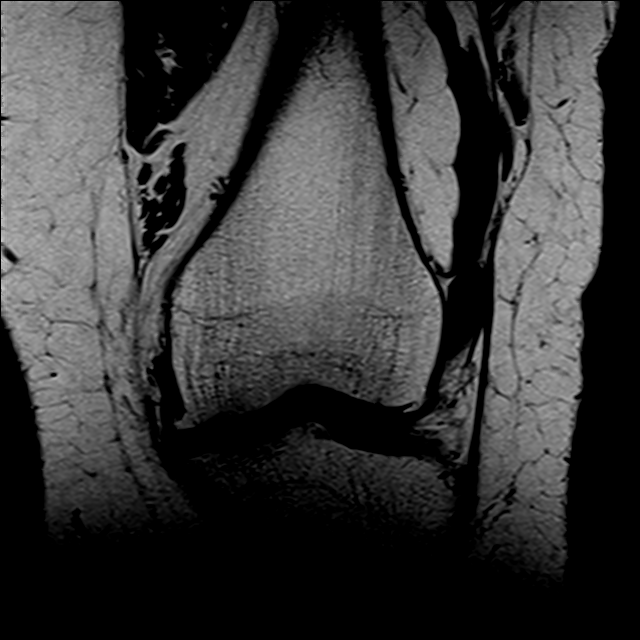
[im 20/24]
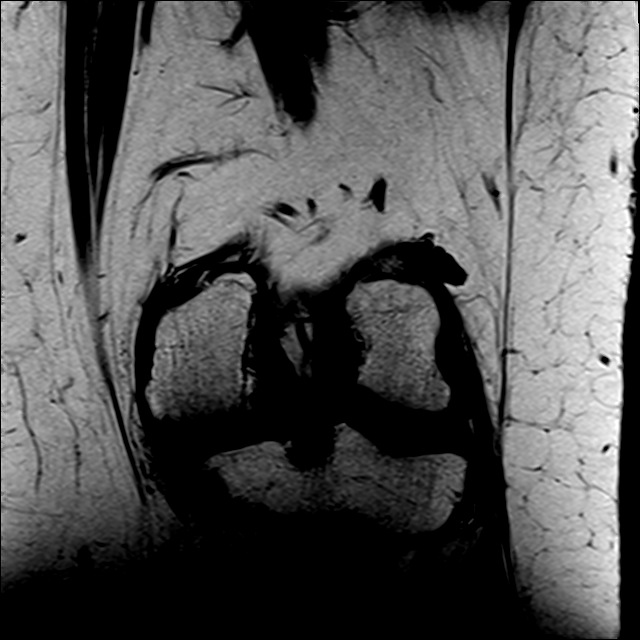

[Series 6: pdfs sag · sagittal · 3.0mm · 0.23mm/px · 3 of 29 slices shown]
[im 5/29]
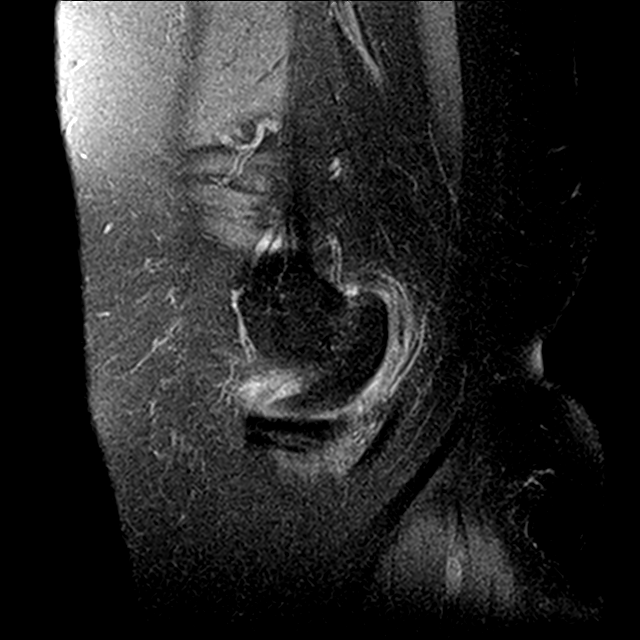
[im 17/29]
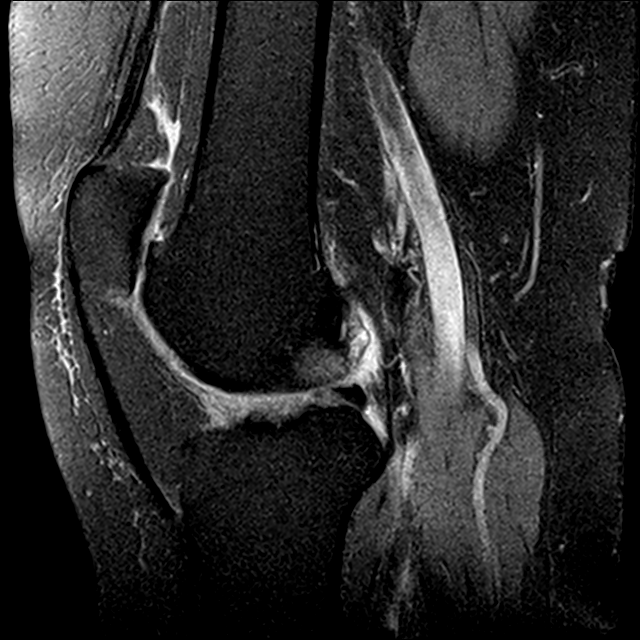
[im 25/29]
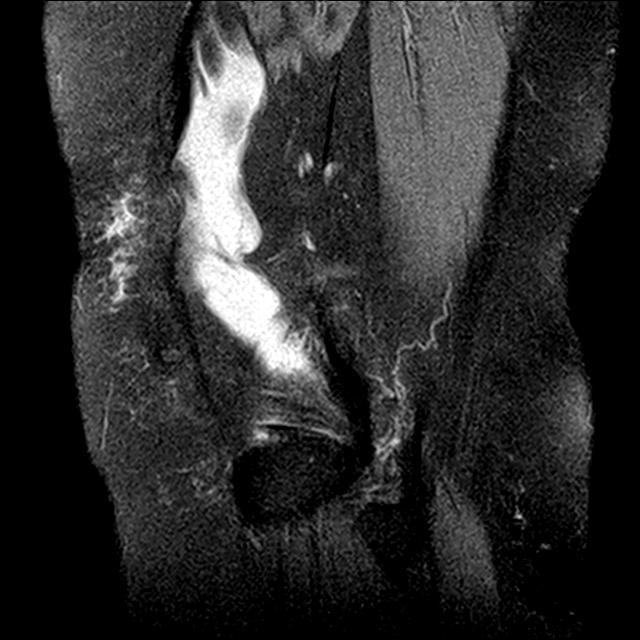

[Series 7: t2fs cor · coronal · 3.0mm · 0.22mm/px · 3 of 24 slices shown]
[im 4/24]
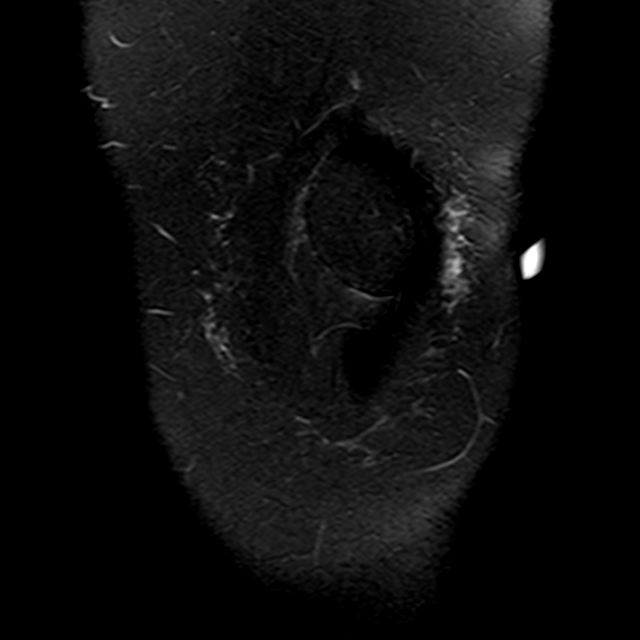
[im 12/24]
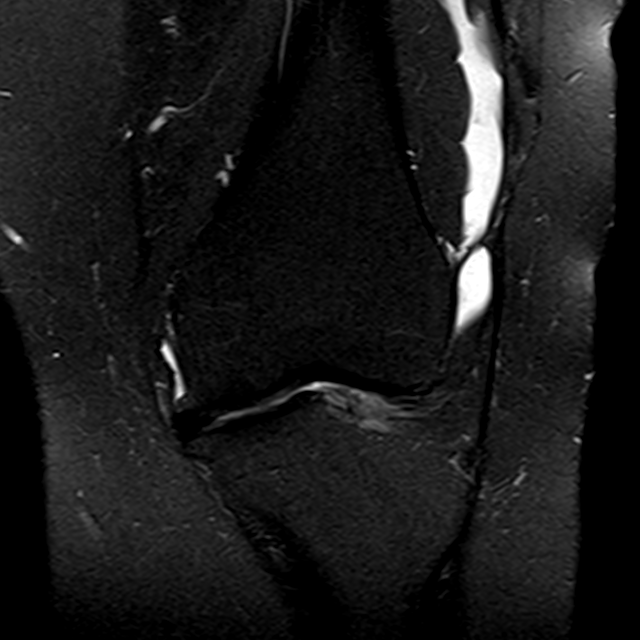
[im 20/24]
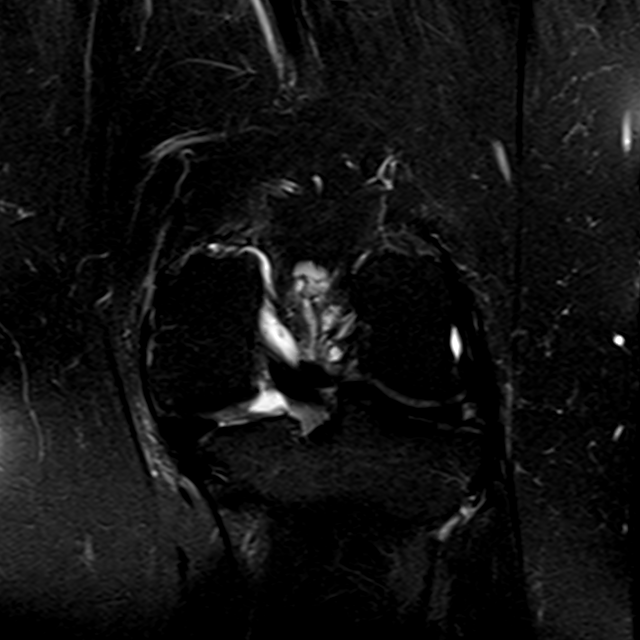

[12 of 40 positions shown; findings below may reference images not displayed]

FINDINGS: MENISCI

Medial meniscus: Large radial tear of the posterior horn, causing a
significant gap extending all the way to the meniscal root.

Lateral meniscus: Degenerative signal in the anterior horn appears
to likely extend to the inferior surface on image 20 of series 6.

LIGAMENTS

Cruciates: Fluid signal centrally within the ACL for example on
image 7 series 7 with mild expansion of the ACL suggesting small ACL
cyst or degeneration. The ACL is not completely torn.

Collaterals: Small ossification noted posteriorly along the MCL,
image 11 series 5, likely from a remote injury. The

CARTILAGE

Patellofemoral: Severe chondral thinning along the lateral facet and
lateral femoral trochlear groove with small degenerative subcortical
foci of edema along both. Moderate chondral thinning in the rest of
the femoral trochlear groove. Mild chondral thinning along the
medial facet. Marginal spurring.

Medial: Moderate to prominent degenerative chondral thinning.
Marginal spurring.

Lateral:  Mild degenerative chondral thinning.  Marginal spurring.

Joint:  Small knee effusion.

Popliteal Fossa:  Unremarkable

Extensor Mechanism: Lateral patellar tilt. If tibial tubercle
-trochlear groove distance 1.8 cm, upper normal. There is some
irregularity of the lateral patellar retinaculum adjacent to the
patella, query prior release. Vertical band of fibrosis in this
vicinity.

Bones: No significant extra-articular osseous abnormalities
identified.
IMPRESSION: 1. Large radial tear posterior horn medial meniscus, with a
significant gap in the meniscus.
2. Degenerative grade 3 signal in the anterior horn lateral meniscus
contacting the inferior meniscal surface.
3. Fluid signal and slight expansion of the ACL suggesting
degeneration or small ACL cyst.
4. Osteoarthritis, severe in the patellofemoral joint and moderate
to prominent in the medial compartment. Lateral patellar tilt and
mild lateral patellar subluxation.
5. Thickened and indistinct lateral patellar retinaculum, query
prior release. There is mild fibrosis tracking in a vertical band
below the lateral patellar retinaculum.
6. Small knee effusion.

## 2016-03-23 ENCOUNTER — Telehealth: Payer: Self-pay | Admitting: Orthopedic Surgery

## 2016-03-23 ENCOUNTER — Other Ambulatory Visit: Payer: Self-pay | Admitting: *Deleted

## 2016-03-23 DIAGNOSIS — M1711 Unilateral primary osteoarthritis, right knee: Secondary | ICD-10-CM

## 2016-03-23 DIAGNOSIS — M545 Low back pain, unspecified: Secondary | ICD-10-CM

## 2016-03-23 DIAGNOSIS — G8929 Other chronic pain: Secondary | ICD-10-CM

## 2016-03-23 MED ORDER — DICLOFENAC SODIUM 75 MG PO TBEC: 75.0000 mg | DELAYED_RELEASE_TABLET | Freq: Two times a day (BID) | ORAL | 5 refills | Status: DC

## 2016-03-23 NOTE — Telephone Encounter (Signed)
Patient requests refill on Hydrocodone/Acetaminophen  5-325  Mgs.   Qty  120   Sig: Take 1 tablet by mouth every 6 (six) hours as needed for moderate pain.

## 2016-03-23 NOTE — Telephone Encounter (Signed)
Patient requests a refill on Diclofenac (Voltaren)   75 mgs.   Qty 60  Sig: Take 1 tablet (75 mg total) by mouth 2 (two) times daily

## 2016-03-23 NOTE — Telephone Encounter (Signed)
Routing to Dr Aline Brochure for approval  Also FYI, I went ahead and refilled her diclofenac per her request

## 2016-03-23 NOTE — Telephone Encounter (Signed)
Refilled as requested  

## 2016-03-27 MED ORDER — HYDROCODONE-ACETAMINOPHEN 5-325 MG PO TABS: 1.0000 | ORAL_TABLET | Freq: Four times a day (QID) | ORAL | 0 refills | Status: DC | PRN

## 2016-04-24 ENCOUNTER — Telehealth: Payer: Self-pay | Admitting: Orthopedic Surgery

## 2016-04-24 NOTE — Telephone Encounter (Signed)
Routing to Dr Harrison for approval 

## 2016-04-24 NOTE — Telephone Encounter (Signed)
Patient requests refill:  °HYDROcodone-acetaminophen (NORCO/VICODIN) 5-325 MG tablet 28 tablet  ° ° °

## 2016-04-27 ENCOUNTER — Other Ambulatory Visit: Payer: Self-pay | Admitting: Orthopedic Surgery

## 2016-04-27 DIAGNOSIS — M545 Low back pain, unspecified: Secondary | ICD-10-CM

## 2016-04-27 DIAGNOSIS — M1711 Unilateral primary osteoarthritis, right knee: Secondary | ICD-10-CM

## 2016-04-27 DIAGNOSIS — G8929 Other chronic pain: Secondary | ICD-10-CM

## 2016-04-27 MED ORDER — HYDROCODONE-ACETAMINOPHEN 5-325 MG PO TABS: 1.0000 | ORAL_TABLET | Freq: Four times a day (QID) | ORAL | 0 refills | Status: DC | PRN

## 2016-04-27 NOTE — Progress Notes (Signed)
[place] controlled substance reporting system reviewed  

## 2016-05-14 ENCOUNTER — Ambulatory Visit (INDEPENDENT_AMBULATORY_CARE_PROVIDER_SITE_OTHER): Payer: Medicare Other | Admitting: Orthopedic Surgery

## 2016-05-14 ENCOUNTER — Ambulatory Visit: Payer: Self-pay | Admitting: Orthopedic Surgery

## 2016-05-14 DIAGNOSIS — M1711 Unilateral primary osteoarthritis, right knee: Secondary | ICD-10-CM

## 2016-05-14 DIAGNOSIS — M25562 Pain in left knee: Secondary | ICD-10-CM | POA: Diagnosis not present

## 2016-05-14 DIAGNOSIS — M545 Low back pain, unspecified: Secondary | ICD-10-CM

## 2016-05-14 DIAGNOSIS — Z9889 Other specified postprocedural states: Secondary | ICD-10-CM

## 2016-05-14 DIAGNOSIS — G8929 Other chronic pain: Secondary | ICD-10-CM

## 2016-05-14 MED ORDER — DICLOFENAC SODIUM 75 MG PO TBEC: 75.0000 mg | DELAYED_RELEASE_TABLET | Freq: Two times a day (BID) | ORAL | 5 refills | Status: DC

## 2016-05-14 MED ORDER — HYDROCODONE-ACETAMINOPHEN 5-325 MG PO TABS: 1.0000 | ORAL_TABLET | Freq: Four times a day (QID) | ORAL | 0 refills | Status: DC | PRN

## 2016-05-14 NOTE — Progress Notes (Signed)
Progress Note for Follow up visit  Patient ID: Shannon Gallegos, female   DOB: 03-Apr-1950, 66 y.o.   MRN: [id number]  Chief Complaint  Patient presents with  . Follow-up    SALK, DOS 04/28/15    HPI Shannon Gallegos is a 66 y.o. female.   66 year old female had left knee arthroscopy and has improved significantly although she has some mild discomfort  She also has chronic right knee arthritis status post reconstructive surgery right knee many years ago prior to seeing [place]. She is also suffering from lower back pain with bilateral lower leg discomfort      Review of Systems Review of Systems Normal neuro  Denies fever  Examination There were no vitals taken for this visit.  Gen. appearance the patient's appearance is normal with normal grooming and  hygiene The patient is oriented to person place and time Mood and affect are normal   Ortho Exam Stability tests are normal  Motor exam 5/5 manual muscle testing , no atrophy  Skin is normal (no rash or erythema)   Left knee has regained full range of motion comes to full extension mild joint line tenderness no effusion  The right knee has a right angle scar starting proximal medial and then coming down and going across the knee. Swelling is noted decreased range of motion   Medical decision-making Diagnosis, Data, Plan (risk)  Encounter Diagnoses  Name Primary?  Marland Kitchen Arthritis of knee, right Yes  . Chronic bilateral low back pain without sciatica   . S/P left knee arthroscopy   . Chronic pain of left knee    Refill medications and follow-up in 6 months  Meds ordered this encounter  Medications  . diclofenac (VOLTAREN) 75 MG EC tablet    Sig: Take 1 tablet (75 mg total) by mouth 2 (two) times daily.    Dispense:  60 tablet    Refill:  5  . HYDROcodone-acetaminophen (NORCO/VICODIN) 5-325 MG tablet    Sig: Take 1 tablet by mouth every 6 (six) hours as needed for moderate pain.    Dispense:  28 tablet    Refill:  [place] controlled substance reporting system reviewed   Arther Abbott, MD 05/14/2016 1:44 PM

## 2016-06-04 ENCOUNTER — Other Ambulatory Visit: Payer: Self-pay | Admitting: Physician Assistant

## 2016-06-04 DIAGNOSIS — Z1231 Encounter for screening mammogram for malignant neoplasm of breast: Secondary | ICD-10-CM

## 2016-07-03 ENCOUNTER — Ambulatory Visit
Admission: RE | Admit: 2016-07-03 | Discharge: 2016-07-03 | Disposition: A | Discharge status: Home / Self Care | Payer: Medicare Other | Source: Ambulatory Visit | Attending: Physician Assistant | Admitting: Physician Assistant

## 2016-07-03 DIAGNOSIS — Z1231 Encounter for screening mammogram for malignant neoplasm of breast: Secondary | ICD-10-CM | POA: Insufficient documentation

## 2016-07-25 ENCOUNTER — Telehealth: Payer: Self-pay | Admitting: Orthopedic Surgery

## 2016-07-25 NOTE — Telephone Encounter (Signed)
Hydrocodone-Acetaminophen  5/325 mg Qty  28 Tablets ° °Take 1 tablet by mouth every 6(six) hours as needed for moderate pain. °

## 2016-07-25 NOTE — Telephone Encounter (Signed)
Routing to Dr. Harrison to approve 

## 2016-07-27 ENCOUNTER — Other Ambulatory Visit: Payer: Self-pay | Admitting: Orthopedic Surgery

## 2016-07-27 DIAGNOSIS — M545 Low back pain: Principal | ICD-10-CM

## 2016-07-27 DIAGNOSIS — G8929 Other chronic pain: Secondary | ICD-10-CM

## 2016-07-27 DIAGNOSIS — M1711 Unilateral primary osteoarthritis, right knee: Secondary | ICD-10-CM

## 2016-07-27 MED ORDER — HYDROCODONE-ACETAMINOPHEN 5-325 MG PO TABS: 1.0000 | ORAL_TABLET | Freq: Four times a day (QID) | ORAL | 0 refills | Status: DC | PRN

## 2016-07-27 NOTE — Progress Notes (Unsigned)
[place] controlled substance reporting system reviewed clean report

## 2016-09-19 ENCOUNTER — Telehealth: Payer: Self-pay | Admitting: Orthopedic Surgery

## 2016-09-19 ENCOUNTER — Other Ambulatory Visit (HOSPITAL_COMMUNITY): Payer: Self-pay | Admitting: Orthopedic Surgery

## 2016-09-19 DIAGNOSIS — M545 Low back pain: Principal | ICD-10-CM

## 2016-09-19 DIAGNOSIS — M1711 Unilateral primary osteoarthritis, right knee: Secondary | ICD-10-CM

## 2016-09-19 DIAGNOSIS — G8929 Other chronic pain: Secondary | ICD-10-CM

## 2016-09-19 MED ORDER — HYDROCODONE-ACETAMINOPHEN 5-325 MG PO TABS: 1.0000 | ORAL_TABLET | Freq: Four times a day (QID) | ORAL | 0 refills | Status: DC | PRN

## 2016-09-19 NOTE — Telephone Encounter (Signed)
Hydrocodone-Acetaminophen  5/325 mg Qty  28 Tablets ° °Take 1 tablet by mouth every 6(six) hours as needed for moderate pain. °

## 2016-09-19 NOTE — Telephone Encounter (Signed)
ROUTING TO DR HARRISON FOR APPROVAL 

## 2016-10-22 ENCOUNTER — Telehealth: Payer: Self-pay | Admitting: Orthopedic Surgery

## 2016-10-22 DIAGNOSIS — M1711 Unilateral primary osteoarthritis, right knee: Secondary | ICD-10-CM

## 2016-10-22 DIAGNOSIS — M545 Low back pain: Principal | ICD-10-CM

## 2016-10-22 DIAGNOSIS — G8929 Other chronic pain: Secondary | ICD-10-CM

## 2016-10-22 NOTE — Telephone Encounter (Signed)
Hydrocodone-Acetaminophen  5/325 mg Qty  28 Tablets ° °Take 1 tablet by mouth every 6(six) hours as needed for moderate pain. °

## 2016-10-23 MED ORDER — HYDROCODONE-ACETAMINOPHEN 5-325 MG PO TABS: 1.0000 | ORAL_TABLET | Freq: Four times a day (QID) | ORAL | 0 refills | Status: DC | PRN

## 2016-11-05 ENCOUNTER — Ambulatory Visit: Payer: Medicare Other | Admitting: Orthopedic Surgery

## 2016-11-05 ENCOUNTER — Ambulatory Visit (INDEPENDENT_AMBULATORY_CARE_PROVIDER_SITE_OTHER): Payer: Medicare Other | Admitting: Orthopedic Surgery

## 2016-11-05 DIAGNOSIS — M25511 Pain in right shoulder: Secondary | ICD-10-CM | POA: Diagnosis not present

## 2016-11-05 DIAGNOSIS — M7521 Bicipital tendinitis, right shoulder: Secondary | ICD-10-CM | POA: Diagnosis not present

## 2016-11-05 DIAGNOSIS — S46811A Strain of other muscles, fascia and tendons at shoulder and upper arm level, right arm, initial encounter: Secondary | ICD-10-CM

## 2016-11-05 NOTE — Patient Instructions (Addendum)
CODMANS EXERCISES   RIGHT SHOULDER APPLY HEAT 20 MIN A DAY

## 2016-11-05 NOTE — Progress Notes (Signed)
NEW PROBLEM // OFFICE VISIT    Chief Complaint  Patient presents with  . Shoulder Injury    RIGHT SHOULDER INJ APPROX JUNE 18     SUBJECTIVE: Shannon Gallegos is a 66 y.o. female who sustained a right shoulder injury 2 month(s) ago. Mechanism of injury: ABDUCTION . Immediate symptoms: immediate pain, inability to use arm directly after injury, was able to use hand directly after injury, no deformity was noted by the patient. Symptoms have been paroxysmal since that time. Prior history of related problems: no prior problems with this area in the past.     Review of Systems  Constitutional: Negative for chills, fever, malaise/fatigue and weight loss.  Musculoskeletal: Positive for back pain, joint pain and myalgias.  Skin: Negative for itching and rash.  Neurological: Negative for tingling and sensory change.    Past Medical History:  Diagnosis Date  . Arthritis   . Borderline diabetes   . GERD (gastroesophageal reflux disease)   . Hypercholesterolemia   . Hypertension   . Vertigo     Past Surgical History:  Procedure Laterality Date  . BREAST BIOPSY Left 2000   neg  . CATARACT EXTRACTION Bilateral   . COLONOSCOPY  March 2014   Dr. Ellender Hose: sigmoid and descending colon diverticula, ADENOMATOUS polyp. Due for surveillance 2019  . ESOPHAGOGASTRODUODENOSCOPY (EGD) WITH ESOPHAGEAL DILATION N/A 12/09/2012   Dr. Oneida Alar: small hiatal hernia, Schatzki's ring at GE junction s/p Savary dilation, benign sessile polyps, moderate gastritis  . KNEE ARTHROSCOPY WITH MEDIAL MENISECTOMY Left 04/28/2015   Procedure: LEFT KNEE ARTHROSCOPY WITH PARTIAL MEDIAL MENISECTOMY;  Surgeon: Carole Civil, MD;  Location: [facility];  Service: Orthopedics;  Laterality: Left;  . KNEE SURGERY Bilateral    arthroscopies  . TUBAL LIGATION      Family History  Problem Relation Age of Onset  . Cirrhosis Father        alcoholic  . Colon cancer Neg Hx    Social History  Substance Use Topics  . Smoking  status: Never Smoker  . Smokeless tobacco: Not on file  . Alcohol use No      Physical Exam  Constitutional: She is oriented to person, place, and time. She appears well-developed and well-nourished.  Neurological: She is alert and oriented to person, place, and time.  Psychiatric: She has a normal mood and affect.  Vitals reviewed.   Left Shoulder Exam   Tenderness  None  Range of Motion  Normal left shoulder ROM  Muscle Strength  Normal left shoulder strength  Tests  Apprehension: Negative Sulcus:            Negative  Comments:  NEUROVASCULAR EXAM IS NORMAL   Right Shoulder Exam   Tenderness  The patient is experiencing tenderness in the biceps tendon, DELTOID INSERTION .  Range of Motion  Normal right shoulder ROM Active Abduction:                       90 Passive Abduction:                    90 Extension:                                  50 Forward Flexion:                        150 External Rotation:  50 Internal Rotation 0 degrees:      Mid Thoracic  Muscle Strength  Normal right shoulder strength  Tests  Impingement:   Negative Hawkins:          Negative Cross Arm:      Negative Drop Arm:        Negative Apprehension: Negative Sulcus:            Negative  Comments:  NEURO-VASCULAR EXAM INTACT     Encounter Diagnoses  Name Primary?  . Strain of right deltoid muscle, initial encounter Yes  . Acute pain of right shoulder   . Biceps tendinitis of right upper extremity      PLAN:   SHE DECLINED FORMAL PT SO I GAVE HER CODMAN EXERCISES   FU 6 WEEK S

## 2016-11-12 ENCOUNTER — Ambulatory Visit: Payer: Medicare Other | Admitting: Orthopedic Surgery

## 2016-11-19 ENCOUNTER — Ambulatory Visit: Payer: Medicare Other | Admitting: Orthopedic Surgery

## 2016-12-05 ENCOUNTER — Ambulatory Visit (INDEPENDENT_AMBULATORY_CARE_PROVIDER_SITE_OTHER): Payer: Medicare Other | Admitting: Orthopedic Surgery

## 2016-12-05 DIAGNOSIS — M1712 Unilateral primary osteoarthritis, left knee: Secondary | ICD-10-CM

## 2016-12-05 DIAGNOSIS — M75101 Unspecified rotator cuff tear or rupture of right shoulder, not specified as traumatic: Secondary | ICD-10-CM | POA: Diagnosis not present

## 2016-12-05 DIAGNOSIS — M25511 Pain in right shoulder: Secondary | ICD-10-CM

## 2016-12-05 DIAGNOSIS — M545 Low back pain, unspecified: Secondary | ICD-10-CM

## 2016-12-05 DIAGNOSIS — Z9889 Other specified postprocedural states: Secondary | ICD-10-CM | POA: Diagnosis not present

## 2016-12-05 DIAGNOSIS — S46811A Strain of other muscles, fascia and tendons at shoulder and upper arm level, right arm, initial encounter: Secondary | ICD-10-CM | POA: Diagnosis not present

## 2016-12-05 DIAGNOSIS — M7521 Bicipital tendinitis, right shoulder: Secondary | ICD-10-CM | POA: Diagnosis not present

## 2016-12-05 DIAGNOSIS — G8929 Other chronic pain: Secondary | ICD-10-CM | POA: Diagnosis not present

## 2016-12-05 DIAGNOSIS — M1711 Unilateral primary osteoarthritis, right knee: Secondary | ICD-10-CM

## 2016-12-05 MED ORDER — HYDROCODONE-ACETAMINOPHEN 5-325 MG PO TABS: 1.0000 | ORAL_TABLET | Freq: Four times a day (QID) | ORAL | 0 refills | Status: DC | PRN

## 2016-12-05 MED ORDER — DICLOFENAC SODIUM 75 MG PO TBEC: 75.0000 mg | DELAYED_RELEASE_TABLET | Freq: Two times a day (BID) | ORAL | 5 refills | Status: DC

## 2016-12-05 NOTE — Patient Instructions (Addendum)
Opioid Overdose Opioids are substances that relieve pain by binding to pain receptors in your brain and spinal cord. Opioids include illegal drugs, such as heroin, as well as prescription pain medicines.An opioid overdose happens when you take too much of an opioid substance. This can happen with any type of opioid, including:  Heroin.  Morphine.  Codeine.  Methadone.  Oxycodone.  Hydrocodone.  Fentanyl.  Hydromorphone.  Buprenorphine.  The effects of an overdose can be mild, dangerous, or even deadly. Opioid overdose is a medical emergency. What are the causes? This condition may be caused by:  Taking too much of an opioid by accident.  Taking too much of an opioid on purpose.  An error made by a health care provider who prescribes a medicine.  An error made by the pharmacist who fills the prescription order.  Using more than one substance that contains opioids at the same time.  Mixing an opioid with a substance that affects your heart, breathing, or blood pressure. These include alcohol, tranquilizers, sleeping pills, illegal drugs, and some over-the-counter medicines.  What increases the risk? This condition is more likely in:  Children. They may be attracted to colorful pills. Because of a child's small size, even a small amount of a drug can be dangerous.  Elderly people. They may be taking many different drugs. Elderly people may have difficulty reading labels or remembering when they last took their medicine.  People who take an opioid on a long-term basis.  People who use: ? Illegal drugs. ? Other substances, including alcohol, while using an opioid.  People who have: ? A history of drug or alcohol abuse. ? Certain mental health conditions.  People who take opioids that are not prescribed for them.  What are the signs or symptoms? Symptoms of this condition depend on the type of opioid and the amount that was taken. Common symptoms  include:  Sleepiness or difficulty waking from sleep.  Confusion.  Slurred speech.  Slowed breathing and a slow pulse.  Nausea and vomiting.  Abnormally small pupils.  Signs and symptoms that require emergency treatment include:  Cold, clammy, and pale skin.  Blue lips and fingernails.  Vomiting.  Gurgling sounds in the throat.  A pulse that is very slow or difficult to detect.  Breathing that is very slow, noisy, or difficult to detect.  Limp body.  Inability to respond to speech or be awakened from sleep (stupor).  How is this diagnosed? This condition is diagnosed based on your symptoms. It is important to tell your health care provider:  All of the opioidsthat you took.  When you took the opioids.  Whether you were drinking alcohol or using other substances.  Your health care provider will do a physical exam. This exam may include:  Checking and monitoring your heart rate and rhythm, your breathing rate and depth, your temperature, and your blood pressure (vital signs).  Checking for abnormally small pupils.  Measuring oxygen levels in your blood.  You may also have blood tests or urine tests. How is this treated? Supporting your vital signs and your breathing is the first step in treating an opioid overdose. Treatment may also include:  Giving fluids and minerals (electrolytes) through an IV tube.  Inserting a breathing tube (endotracheal tube) in your airway to help you breathe.  Giving oxygen.  Passing a tube through your nose and into your stomach (NG tube, or nasogastric tube) to wash out your stomach.  Giving medicines that: ? Increase your   blood pressure. ? Absorb any opioid that is in your digestive system. ? Reverse the effects of the opioid (naloxone).  Ongoing counseling and mental health support if you intentionally overdosed or used an illegal drug.  Follow these instructions at home:  Take over-the-counter and prescription  medicines only as told by your health care provider. Always ask your health care provider about possible side effects and interactions of any new medicine that you start taking.  Keep a list of all of the medicines that you take, including over-the-counter medicines. Bring this list with you to all of your medical visits.  Drink enough fluid to keep your urine clear or pale yellow.  Keep all follow-up visits as told by your health care provider. This is important. How is this prevented?  Get help if you are struggling with: ? Alcohol or drug use. ? Depression or another mental health problem.  Keep the phone number of your local poison control center near your phone or on your cell phone.  Store all medicines in safety containers that are out of the reach of children.  Read the drug inserts that come with your medicines.  Do not drink alcohol when taking opioids.  Do not use illegal drugs.  Do not take opioid medicines that are not prescribed for you. Contact a health care provider if:  Your symptoms return.  You develop new symptoms or side effects when you are taking medicines. Get help right away if:  You think that you or someone else may have taken too much of an opioid. The hotline of the [facility] is 539-542-4962.  You or someone else is having symptoms of an opioid overdose.  You have serious thoughts about hurting yourself or others.  You have: ? Chest pain. ? Difficulty breathing. ? A loss of consciousness. Opioid overdose is an emergency. Do not wait to see if the symptoms will go away. Get medical help right away. Call your local emergency services (911 in the [place] Do not drive yourself to the hospital. This information is not intended to replace advice given to you by your health care provider. Make sure you discuss any questions you have with your health care provider. Document Released: 04/19/2004 Document Revised: 08/18/2015 Document  Reviewed: 08/26/2014 Elsevier Interactive Patient Education  2018 [place] Need to Know About Prescription Opioid Pain Medicine Opioids are powerful medicines that are used to treat moderate to severe pain. Opioids should be taken with the supervision of a trained health care provider. They should be taken for the shortest period of time as possible. This is because opioids can be addictive and the longer you take opioids, the greater your risk of addiction (opioid use disorder). What do opioids do? Opioids help reduce or eliminate pain. When used for short periods of time, they can help you:  Sleep better.  Do better in physical or occupational therapy.  Feel better in the first few days after an injury.  Recover from surgery.  What kind of problems can opioids cause? Opioids can cause side effects, such as:  Constipation.  Nausea.  Vomiting.  Drowsiness.  Confusion.  Opioid use disorder.  Breathing difficulties (respiratory depression).  Using opioid pain medicines for longer than 3 days increases your risk of these side effects. Taking opioid pain medicine for a long period of time can affect your ability to do daily tasks. It also puts you at risk for:  Car accidents.  Heart attack.  Overdose, which can sometimes lead to death.  What can increase my risk for developing problems while taking opioids? You may be at an especially high risk for problems while taking opioids if you:  Are over the age of 74.  Are pregnant.  Have kidney or liver disease.  Have certain mental health conditions, such as depression or anxiety.  Have a history of substance use disorder.  Have had an opioid overdose in the past.  How do I stop taking opioids if I have been taking them for a long time? If you have been taking opioid medicine for more than a few weeks, you may need to slowly stop taking them (taper). Tapering your use of opioids can decrease your  chances of experiencing withdrawal symptoms, such as:  Abdominal pain and cramping.  Nausea.  Sweating.  Sleepiness.  Restlessness.  Uncontrollable shaking (tremors).  Cravings for the medicine.  Do not attempt to taper your use of opioids on your own. Talk with your health care provider about how to do this. Your health care provider may prescribe a step-down schedule based on how much medicine you are taking and how long you have been taking it. What are the benefits of stopping the use of opioids? By switching from opioid pain medicine to non-opioid pain management options, you will decrease your risk of accidents and injuries associated with long-term opioid use. You will also be able to:  Monitor your pain more accurately and know when to seek medical care if it is not improving.  Decrease risk to others around you. Having opioids in the home increases the risk for accidental or intentional use or overdose by others.  How can I treat pain without opioids? Pain can be managed with many types of alternative treatments. Ask your health care provider to refer you to one or more specialists who can help you manage pain through:  Physical or occupational therapy.  Counseling (cognitive-behavioral therapy).  Good nutrition.  Biofeedback.  Massage.  Meditation.  Non-opioid medicine.  Following a gentle exercise program.  Where can I get support? If you have been taking opioids for a long time, you may benefit from receiving support for quitting from a local support group or counselor. Ask your health care provider for a referral to these resources in your area. When should I seek medical care? Seek medical care right away if you are taking opioids and you experience any of the following:  Difficulty breathing.  Breathing that is more shallow or slower than normal.  A very slow heartbeat (pulse).  Severe confusion.  Unconsciousness.  Sleepiness.  Difficulty  waking from sleep.  Slurred speech.  Nausea and vomiting.  Cold, clammy skin.  Blue lips or fingernails.  Limpness.  Abnormally small pupils.  If you think that you or someone else may have taken too much of an opioid medicine, get medical help right away. Do not wait to see if the symptoms go away on their own. Call your local emergency services (911 in the [place] or call the hotline of the [facility] 226 393 8908 in the [place].). Where can I get more information? To learn more about opioid medicines, visit the Centers for Disease Control and Prevention web site Opioid Basics at https://keller-santana.com/. Summary  Opioid medicines can help you manage moderate-to-severe pain for a short period of time.  Taking opioid pain medicine for a long period of time puts you at risk for unintentional accidents, injury, and even death.  If you  think that you or someone else may have taken too much of an opioid, get medical help right away. This information is not intended to replace advice given to you by your health care provider. Make sure you discuss any questions you have with your health care provider. Document Released: 04/08/2015 Document Revised: 11/04/2015 Document Reviewed: 10/22/2014 Elsevier Interactive Patient Education  2017 [employer].

## 2016-12-05 NOTE — Progress Notes (Signed)
Chief Complaint  Patient presents with  . Shoulder Injury      RIGHT SHOULDER INJ APPROX JUNE 18        Prior to visit history and review of systems SUBJECTIVE: Shannon Gallegos is a 66 y.o. female who sustained a right shoulder injury 2 month(s) ago. Mechanism of injury: ABDUCTION . Immediate symptoms: immediate pain, inability to use arm directly after injury, was able to use hand directly after injury, no deformity was noted by the patient. Symptoms have been paroxysmal since that time. Prior history of related problems: no prior problems with this area in the past.       Review of Systems  Constitutional: Negative for chills, fever, malaise/fatigue and weight loss.  Musculoskeletal: Positive for back pain, joint pain and myalgias.  Skin: Negative for itching and rash.  Neurological: Negative for tingling and sensory change.   Today she is Not any better after home exercises right shoulder still complains of pain pain at night pain with overhead activity  She is also here for recheck on her left knee and right knee She complains of mild dull aching pain intermittently worse with activity right and left knee which is chronic   General appearance she is well-developed and well-nourished grooming and hygiene are normal As always she is oriented 3 mood and affect pleasant and normal  Right shoulder Inspection tenderness around the deltoid Stability stable Tal tests weakness  Strength tests normal infraspinatus and subscapularis weakness right rotator cuff supraspinatus Range of motion decreased flexion normal external rotation decreased internal rotation Neuro no sensory deficits right upper extremity Vascular normal radial pulse right arm   Tenderness diffuse in both knees flexion slightly better on the left than the right at 120 versus 115. Minimal flexion contractures in each knee  Both knee stable  Both quadriceps have normal strength  Skin shows surgical incisions on  the right knee from prior surgery left knee area of mild peripheral edema   Encounter Diagnoses  Name Primary?  . Strain of right deltoid muscle, initial encounter Yes  . Acute pain of right shoulder   . Biceps tendinitis of right upper extremity   . Chronic bilateral low back pain without sciatica   . S/P left knee arthroscopy   . Arthritis of knee, right   . Primary osteoarthritis of left knee   . Tear of right rotator cuff, unspecified tear extent     Procedure note left knee injection verbal consent was obtained to inject left knee joint  Timeout was completed to confirm the site of injection  The medications used were 40 mg of Depo-Medrol and 1% lidocaine 3 cc  Anesthesia was provided by ethyl chloride and the skin was prepped with alcohol.  After cleaning the skin with alcohol a 20-gauge needle was used to inject the left knee joint. There were no complications. A sterile bandage was applied.   Procedure note right knee injection verbal consent was obtained to inject right knee joint  Timeout was completed to confirm the site of injection  The medications used were 40 mg of Depo-Medrol and 1% lidocaine 3 cc  Anesthesia was provided by ethyl chloride and the skin was prepped with alcohol.  After cleaning the skin with alcohol a 20-gauge needle was used to inject the right knee joint. There were no complications. A sterile bandage was applied.   MRI right shoulder rotator cuff tear surgery planned for rotator cuff repair

## 2016-12-17 ENCOUNTER — Ambulatory Visit: Payer: Medicare Other | Admitting: Orthopedic Surgery

## 2016-12-25 ENCOUNTER — Other Ambulatory Visit: Payer: Medicare Other

## 2016-12-27 ENCOUNTER — Telehealth: Payer: Self-pay | Admitting: Orthopedic Surgery

## 2016-12-27 NOTE — Telephone Encounter (Signed)
Hydrocodone-Acetaminophen  5/325 mg Qty  28 Tablets  Take 1 tablet by mouth every 6(six) hours as needed for moderate pain.

## 2016-12-28 ENCOUNTER — Other Ambulatory Visit: Payer: Self-pay | Admitting: Orthopedic Surgery

## 2016-12-28 ENCOUNTER — Ambulatory Visit: Payer: Medicare Other | Admitting: Orthopedic Surgery

## 2016-12-28 DIAGNOSIS — M545 Low back pain: Principal | ICD-10-CM

## 2016-12-28 DIAGNOSIS — M1711 Unilateral primary osteoarthritis, right knee: Secondary | ICD-10-CM

## 2016-12-28 DIAGNOSIS — G8929 Other chronic pain: Secondary | ICD-10-CM

## 2016-12-28 MED ORDER — HYDROCODONE-ACETAMINOPHEN 5-325 MG PO TABS: 1.0000 | ORAL_TABLET | Freq: Three times a day (TID) | ORAL | 0 refills | Status: DC | PRN

## 2017-01-08 ENCOUNTER — Telehealth: Payer: Self-pay | Admitting: Radiology

## 2017-01-08 NOTE — Telephone Encounter (Signed)
Patient cancelled MRI scan of her Right shoulder/ you ordered this for her on 12/05/16. To you FYI

## 2017-01-31 ENCOUNTER — Telehealth: Payer: Self-pay | Admitting: Orthopedic Surgery

## 2017-01-31 NOTE — Telephone Encounter (Signed)
Patient called today stating that she did not go for MRI because her shoulder is feeling better. She does not have a follow up appointment in the office. However, she states that her knees and her back is still hurting therefore she is requesting a refill on   Hydrocodone/Acetaminophen 5-325  Mgs.  Qty 42  Sig: Take 1 tablet by mouth every 8 (eight) hours as needed for moderate pain. Must last 2 weeks

## 2017-02-01 ENCOUNTER — Other Ambulatory Visit: Payer: Self-pay | Admitting: Orthopedic Surgery

## 2017-02-01 DIAGNOSIS — M1711 Unilateral primary osteoarthritis, right knee: Secondary | ICD-10-CM

## 2017-02-01 DIAGNOSIS — M545 Low back pain: Principal | ICD-10-CM

## 2017-02-01 DIAGNOSIS — G8929 Other chronic pain: Secondary | ICD-10-CM

## 2017-02-01 MED ORDER — HYDROCODONE-ACETAMINOPHEN 5-325 MG PO TABS
1.0000 | ORAL_TABLET | Freq: Three times a day (TID) | ORAL | 0 refills | Status: DC | PRN
Start: 2017-02-01 — End: 2017-03-12

## 2017-03-11 ENCOUNTER — Ambulatory Visit: Payer: Medicare Other | Admitting: Orthopedic Surgery

## 2017-03-12 ENCOUNTER — Encounter: Payer: Self-pay | Admitting: Orthopedic Surgery

## 2017-03-12 ENCOUNTER — Ambulatory Visit (INDEPENDENT_AMBULATORY_CARE_PROVIDER_SITE_OTHER): Payer: Medicare Other | Admitting: Orthopedic Surgery

## 2017-03-12 VITALS — BP 151/101 | HR 90 | Ht 64.0 in | Wt 215.0 lb

## 2017-03-12 DIAGNOSIS — M545 Low back pain: Secondary | ICD-10-CM

## 2017-03-12 DIAGNOSIS — M1712 Unilateral primary osteoarthritis, left knee: Secondary | ICD-10-CM

## 2017-03-12 DIAGNOSIS — G8929 Other chronic pain: Secondary | ICD-10-CM | POA: Diagnosis not present

## 2017-03-12 DIAGNOSIS — M1711 Unilateral primary osteoarthritis, right knee: Secondary | ICD-10-CM | POA: Diagnosis not present

## 2017-03-12 MED ORDER — HYDROCODONE-ACETAMINOPHEN 5-325 MG PO TABS: 1.0000 | ORAL_TABLET | Freq: Three times a day (TID) | ORAL | 0 refills | Status: DC | PRN

## 2017-03-12 MED ORDER — DICLOFENAC SODIUM 75 MG PO TBEC: 75.0000 mg | DELAYED_RELEASE_TABLET | Freq: Two times a day (BID) | ORAL | 5 refills | Status: DC

## 2017-03-12 NOTE — Progress Notes (Signed)
Progress Note   Patient ID: Shannon Gallegos, female   DOB: 10-26-1950, 66 y.o.   MRN: [id number]  Chief Complaint  Patient presents with  . Knee Pain    bilateral     66 year old comes in for routine follow-up for chronic medication management as well as reevaluation of persistent ongoing pain in both knees as well as pain all over and including all joints back and varus myalgias  Currently on diclofenac and hydrocodone     Review of Systems  Constitutional: Negative for fever.  Musculoskeletal: Positive for back pain, joint pain and myalgias.  Neurological: Negative for tingling and weakness.   Current Meds  Medication Sig  . atorvastatin (LIPITOR) 40 MG tablet Take 40 mg by mouth daily.  . diclofenac (VOLTAREN) 75 MG EC tablet Take 1 tablet (75 mg total) by mouth 2 (two) times daily.  Marland Kitchen esomeprazole (NEXIUM) 40 MG capsule Take 40 mg by mouth daily at 12 noon.  . fluticasone (FLONASE) 50 MCG/ACT nasal spray Place 2 sprays into the nose daily.  Marland Kitchen gabapentin (NEURONTIN) 300 MG capsule Take 300 mg by mouth 3 (three) times daily.  . hydrochlorothiazide (HYDRODIURIL) 25 MG tablet Take 25 mg by mouth daily.  Marland Kitchen HYDROcodone-acetaminophen (NORCO/VICODIN) 5-325 MG tablet Take 1 tablet every 8 (eight) hours as needed by mouth for moderate pain. Must last 2 weeks  . meclizine (ANTIVERT) 25 MG tablet Take 25 mg by mouth 3 (three) times daily as needed for dizziness.   . potassium chloride SA (K-DUR,KLOR-CON) 20 MEQ tablet Take 1 tablet (20 mEq total) by mouth daily.  . traZODone (DESYREL) 50 MG tablet Take 50 mg by mouth at bedtime.  . triamcinolone (KENALOG) 0.025 % cream Apply 1 application topically 3 (three) times daily.    No Known Allergies   BP (!) 151/101   Pulse 90   Ht 5\' 4"  (1.626 m)   Wt 215 lb (97.5 kg)   BMI 36.90 kg/m   Physical Exam  Constitutional: She is oriented to person, place, and time. She appears well-developed and well-nourished. No distress.    Musculoskeletal:       Back:       Legs: Neurological: She is alert and oriented to person, place, and time.  Skin: Skin is warm and dry. No rash noted. She is not diaphoretic. No erythema. No pallor.  Psychiatric: She has a normal mood and affect. Her behavior is normal. Judgment and thought content normal.     Medical decision-making Encounter Diagnoses  Name Primary?  . Chronic bilateral low back pain without sciatica   . Arthritis of knee, right   . Arthritis of left knee Yes    Recommend continue diclofenac and hydrocodone.  Also recommend hyaluronic acid.  I asked her to have her doctor check her vitamin D levels   Arther Abbott, MD 03/12/2017 10:33 AM

## 2017-03-12 NOTE — Patient Instructions (Signed)
Hyaluronic Acid for joints   Krill oil   Have your doctor check your Vit D

## 2017-04-10 ENCOUNTER — Telehealth: Payer: Self-pay | Admitting: Orthopedic Surgery

## 2017-04-10 NOTE — Telephone Encounter (Signed)
Patient requests refill on Hydrocodone/Acetaminophen 5-325  Mgs.   Qty  42   Sig: Take 1 tablet by mouth every 8 (eight) hours as needed for moderate pain. Must last 2 weeks  Patient uses [facility]

## 2017-04-11 ENCOUNTER — Other Ambulatory Visit: Payer: Self-pay | Admitting: Orthopedic Surgery

## 2017-04-11 DIAGNOSIS — M1711 Unilateral primary osteoarthritis, right knee: Secondary | ICD-10-CM

## 2017-04-11 DIAGNOSIS — G8929 Other chronic pain: Secondary | ICD-10-CM

## 2017-04-11 DIAGNOSIS — M545 Low back pain: Principal | ICD-10-CM

## 2017-04-11 MED ORDER — HYDROCODONE-ACETAMINOPHEN 5-325 MG PO TABS: 1.0000 | ORAL_TABLET | Freq: Three times a day (TID) | ORAL | 0 refills | Status: DC | PRN

## 2017-04-11 NOTE — Telephone Encounter (Signed)
  NARX SCORES    Narcotic  300   Sedative  160   Stimulant  done  Explanation and Guidance   OVERDOSE RISK SCORE    250   (Range 000-999)  Explanation and Guidance   ADDITIONAL RISK INDICATORS (0)    Explanation and Guidance

## 2017-05-10 ENCOUNTER — Other Ambulatory Visit: Payer: Self-pay | Admitting: Orthopedic Surgery

## 2017-05-10 DIAGNOSIS — M1711 Unilateral primary osteoarthritis, right knee: Secondary | ICD-10-CM

## 2017-05-10 DIAGNOSIS — M545 Low back pain: Principal | ICD-10-CM

## 2017-05-10 DIAGNOSIS — G8929 Other chronic pain: Secondary | ICD-10-CM

## 2017-05-10 NOTE — Telephone Encounter (Signed)
Patient requests refill on Hydrocodone/Acetaminophen 5-325  Mgs.  Qty  42  Sig: Take 1 tablet by mouth every 8 (eight) hours as needed for moderate pain. Must last 2 weeks  Patient states she uses [facility] [facility]

## 2017-05-14 MED ORDER — HYDROCODONE-ACETAMINOPHEN 5-325 MG PO TABS: 1.0000 | ORAL_TABLET | Freq: Three times a day (TID) | ORAL | 0 refills | Status: DC | PRN

## 2017-06-10 ENCOUNTER — Ambulatory Visit (INDEPENDENT_AMBULATORY_CARE_PROVIDER_SITE_OTHER): Payer: Medicare Other | Admitting: Orthopedic Surgery

## 2017-06-10 ENCOUNTER — Encounter: Payer: Self-pay | Admitting: Orthopedic Surgery

## 2017-06-10 VITALS — BP 117/73 | HR 84 | Ht 64.0 in | Wt 213.0 lb

## 2017-06-10 DIAGNOSIS — G8929 Other chronic pain: Secondary | ICD-10-CM

## 2017-06-10 DIAGNOSIS — Z9889 Other specified postprocedural states: Secondary | ICD-10-CM

## 2017-06-10 DIAGNOSIS — M545 Low back pain, unspecified: Secondary | ICD-10-CM

## 2017-06-10 DIAGNOSIS — M1712 Unilateral primary osteoarthritis, left knee: Secondary | ICD-10-CM | POA: Diagnosis not present

## 2017-06-10 DIAGNOSIS — M1711 Unilateral primary osteoarthritis, right knee: Secondary | ICD-10-CM

## 2017-06-10 MED ORDER — DICLOFENAC SODIUM 75 MG PO TBEC: 75.0000 mg | DELAYED_RELEASE_TABLET | Freq: Two times a day (BID) | ORAL | 5 refills | Status: DC

## 2017-06-10 MED ORDER — HYDROCODONE-ACETAMINOPHEN 5-325 MG PO TABS: 1.0000 | ORAL_TABLET | Freq: Three times a day (TID) | ORAL | 0 refills | Status: DC | PRN

## 2017-06-10 NOTE — Progress Notes (Signed)
Progress Note   Patient ID: Shannon Gallegos, female   DOB: [birth year], 67 y.o.   MRN: [id number]  Chief Complaint  Patient presents with  . Follow-up    Recheck on bilateral knees.    66 year old comes in for routine follow-up  She is here today to check on both knees.  She says the left one is doing great the right one gets stiff occasionally  This is managed with Voltaren and hydrocodone for chronic pain  Quality of the pain is dull occasional aching the mild to moderate  Duration chronic  Timing appears to be exercise and activity related with increased pain with more activity  Modifying factors seem to be excessive activity     Past Medical History:  Diagnosis Date  . Arthritis   . Borderline diabetes   . GERD (gastroesophageal reflux disease)   . Hypercholesterolemia   . Hypertension   . Vertigo      Review of Systems  Constitutional: Negative.   Musculoskeletal: Positive for back pain and joint pain.  Skin: Negative.    No outpatient medications have been marked as taking for the 06/10/17 encounter (Office Visit) with Carole Civil, MD.    No Known Allergies   BP 117/73   Pulse 84   Ht 5\' 4"  (1.626 m)   Wt 213 lb (96.6 kg)   BMI 36.56 kg/m   Physical Exam  Constitutional: She is oriented to person, place, and time. She appears well-developed and well-nourished.  Neurological: She is alert and oriented to person, place, and time.  Psychiatric: She has a normal mood and affect. Judgment normal.  Vitals reviewed.  Right Knee Exam   Tenderness  The patient is experiencing tenderness in the lateral joint line and medial joint line.  Range of Motion  Extension: 0  Right knee flexion: 115.   Tests  [place]:  Medial - negative  Drawer:  Anterior - negative    Posterior - negative   Left Knee Exam   Tenderness  The patient is experiencing tenderness in the medial joint line.  Range of Motion  Extension: 0  Left knee flexion: 125.    Tests  Mc[place]ray:  Medial - negative  Drawer:  Anterior - negative     Posterior - negative      Medical decision-making Encounter Diagnoses  Name Primary?  . Chronic bilateral low back pain without sciatica Yes  . Arthritis of knee, right   . Arthritis of left knee   . S/P left knee arthroscopy    Back pain mild and infrequent Left knee stable no pian  Right knee mild pain occasional stiffness    Data bank checked   Meds ordered this encounter  Medications  . diclofenac (VOLTAREN) 75 MG EC tablet    Sig: Take 1 tablet (75 mg total) by mouth 2 (two) times daily.    Dispense:  60 tablet    Refill:  5  . HYDROcodone-acetaminophen (NORCO/VICODIN) 5-325 MG tablet    Sig: Take 1 tablet by mouth every 8 (eight) hours as needed for moderate pain.    Dispense:  42 tablet    Refill:  0    Return 3 months    Arther Abbott, MD 06/10/2017 10:41 AM

## 2017-06-20 ENCOUNTER — Other Ambulatory Visit: Payer: Self-pay | Admitting: Family Medicine

## 2017-06-20 DIAGNOSIS — Z1231 Encounter for screening mammogram for malignant neoplasm of breast: Secondary | ICD-10-CM

## 2017-07-16 ENCOUNTER — Ambulatory Visit
Admission: RE | Admit: 2017-07-16 | Discharge: 2017-07-16 | Disposition: A | Discharge status: Home / Self Care | Payer: Medicare Other | Source: Ambulatory Visit | Attending: Family Medicine | Admitting: Family Medicine

## 2017-07-16 DIAGNOSIS — Z1231 Encounter for screening mammogram for malignant neoplasm of breast: Secondary | ICD-10-CM

## 2017-07-22 ENCOUNTER — Telehealth: Payer: Self-pay | Admitting: Orthopedic Surgery

## 2017-07-22 NOTE — Telephone Encounter (Signed)
Patient requests a refill on Hydrocodone/Acetaminophen 5-325 mgs.   Qty  42  Sig: Take 1 tablet by mouth every 8 (eight) hours as needed for moderate pain.  Patient states she uses [facility]

## 2017-07-23 ENCOUNTER — Other Ambulatory Visit: Payer: Self-pay | Admitting: Orthopedic Surgery

## 2017-07-23 DIAGNOSIS — M545 Low back pain: Principal | ICD-10-CM

## 2017-07-23 DIAGNOSIS — M1711 Unilateral primary osteoarthritis, right knee: Secondary | ICD-10-CM

## 2017-07-23 DIAGNOSIS — G8929 Other chronic pain: Secondary | ICD-10-CM

## 2017-07-23 MED ORDER — HYDROCODONE-ACETAMINOPHEN 5-325 MG PO TABS: 1.0000 | ORAL_TABLET | Freq: Three times a day (TID) | ORAL | 0 refills | Status: DC | PRN

## 2017-09-03 ENCOUNTER — Other Ambulatory Visit: Payer: Self-pay | Admitting: Orthopedic Surgery

## 2017-09-03 DIAGNOSIS — G8929 Other chronic pain: Secondary | ICD-10-CM

## 2017-09-03 DIAGNOSIS — M1711 Unilateral primary osteoarthritis, right knee: Secondary | ICD-10-CM

## 2017-09-03 DIAGNOSIS — M545 Low back pain: Principal | ICD-10-CM

## 2017-09-03 MED ORDER — HYDROCODONE-ACETAMINOPHEN 5-325 MG PO TABS: 1.0000 | ORAL_TABLET | Freq: Three times a day (TID) | ORAL | 0 refills | Status: DC | PRN

## 2017-09-03 NOTE — Telephone Encounter (Signed)
Patient requests refill: HYDROcodone-acetaminophen (NORCO/VICODIN) 5-325 MG tablet 42 tablet  - [place]

## 2017-09-17 ENCOUNTER — Ambulatory Visit (INDEPENDENT_AMBULATORY_CARE_PROVIDER_SITE_OTHER): Payer: Medicare Other | Admitting: Orthopedic Surgery

## 2017-09-17 ENCOUNTER — Encounter: Payer: Self-pay | Admitting: Orthopedic Surgery

## 2017-09-17 VITALS — BP 115/82 | HR 90 | Ht 64.0 in | Wt 208.0 lb

## 2017-09-17 DIAGNOSIS — G8929 Other chronic pain: Secondary | ICD-10-CM | POA: Diagnosis not present

## 2017-09-17 DIAGNOSIS — M1711 Unilateral primary osteoarthritis, right knee: Secondary | ICD-10-CM

## 2017-09-17 DIAGNOSIS — M1712 Unilateral primary osteoarthritis, left knee: Secondary | ICD-10-CM

## 2017-09-17 DIAGNOSIS — M545 Low back pain: Secondary | ICD-10-CM | POA: Diagnosis not present

## 2017-09-17 MED ORDER — DICLOFENAC SODIUM 75 MG PO TBEC: 75.0000 mg | DELAYED_RELEASE_TABLET | Freq: Two times a day (BID) | ORAL | 5 refills | Status: DC

## 2017-09-17 MED ORDER — HYDROCODONE-ACETAMINOPHEN 5-325 MG PO TABS: 1.0000 | ORAL_TABLET | Freq: Three times a day (TID) | ORAL | 0 refills | Status: DC | PRN

## 2017-09-17 NOTE — Progress Notes (Signed)
Progress Note   Patient ID: QUETZALLI CLOS, female   DOB: 1950-09-10, 67 y.o.   MRN: [id number]   Chief Complaint  Patient presents with  . Knee Pain    bilateral     67 year old female with bilateral knee arthritis chronic lower back pain presents for reevaluation and checkup regarding bilateral knee arthritis.  Currently her left knee is feeling pretty good her right knee is a little bit sore and painful with extension with pain around the patellofemoral area.  She is status post patellofemoral surgery many years ago not sure of the exact procedure she has 2 scars over the front of the knee    Review of Systems  Musculoskeletal: Positive for back pain.  Neurological: Negative for tingling, tremors, sensory change, focal weakness and weakness.     No Known Allergies   BP 115/82   Pulse 90   Ht 5\' 4"  (1.626 m)   Wt 208 lb (94.3 kg)   BMI 35.70 kg/m   Physical Exam  Constitutional: She is oriented to person, place, and time. She appears well-developed and well-nourished.  Musculoskeletal:       Legs: Neurological: She is alert and oriented to person, place, and time. Gait normal.  Psychiatric: She has a normal mood and affect. Judgment normal.  Vitals reviewed.    Medical decisions:   Data  Imaging:   NO  Encounter Diagnoses  Name Primary?  . Chronic bilateral low back pain without sciatica   . Arthritis of left knee   . Arthritis of knee, right Yes    PLAN:   Right knee injection Procedure note left knee injection verbal consent was obtained to inject left knee joint  Timeout was completed to confirm the site of injection  The medications used were 40 mg of Depo-Medrol and 1% lidocaine 3 cc  Anesthesia was provided by ethyl chloride and the skin was prepped with alcohol.  After cleaning the skin with alcohol a 20-gauge needle was used to inject the left knee joint. There were no complications. A sterile bandage was applied.  Continue diclofenac  and hydrocodone for pain  Patient is stable on these medications.  Her functional daily activities have improved on this medication.  She can perform activities of daily living and has minimal discomfort.  Bilateral lower back pain is also stable  Follow-up 3 months x-ray both knees  09/03/2017 1 09/03/2017 Hydrocodone-Acetamin 5-325 Mg  42 [address] [id number] [facility] ([id number]) 0 15.00 MME Private Pay [place] 07/23/2017 1 07/23/2017 Hydrocodone-Acetamin 5-325 Mg  42 [address] [id number] Nor ([id number]) 0 15.00 MME Private Pay [place] 06/10/2017 1 06/10/2017 Hydrocodone-Acetamin 5-325 Mg  42 [address] [id number] Nor ([id number]) 0 15.00 MME Private Pay [place]   Arther Abbott, MD 09/17/2017 11:21 AM

## 2017-11-04 ENCOUNTER — Other Ambulatory Visit: Payer: Self-pay | Admitting: Orthopedic Surgery

## 2017-11-04 DIAGNOSIS — G8929 Other chronic pain: Secondary | ICD-10-CM

## 2017-11-04 DIAGNOSIS — M545 Low back pain: Principal | ICD-10-CM

## 2017-11-04 DIAGNOSIS — M1711 Unilateral primary osteoarthritis, right knee: Secondary | ICD-10-CM

## 2017-11-04 NOTE — Telephone Encounter (Signed)
Hydrocodone-Acetaminophen  5/325 mg  Qty 42 Tablets  Take 1 tablet by mouth every 8 (eight) hours as needed for moderate pain.  PATIENT USES [facility]

## 2017-11-05 ENCOUNTER — Other Ambulatory Visit: Payer: Self-pay | Admitting: Orthopedic Surgery

## 2017-11-05 DIAGNOSIS — G8929 Other chronic pain: Secondary | ICD-10-CM

## 2017-11-05 DIAGNOSIS — M545 Low back pain: Principal | ICD-10-CM

## 2017-11-05 DIAGNOSIS — M1711 Unilateral primary osteoarthritis, right knee: Secondary | ICD-10-CM

## 2017-11-05 MED ORDER — HYDROCODONE-ACETAMINOPHEN 5-325 MG PO TABS: 1.0000 | ORAL_TABLET | Freq: Three times a day (TID) | ORAL | 0 refills | Status: DC | PRN

## 2017-11-07 ENCOUNTER — Other Ambulatory Visit: Payer: Self-pay | Admitting: Orthopedic Surgery

## 2017-11-07 DIAGNOSIS — G8929 Other chronic pain: Secondary | ICD-10-CM

## 2017-11-07 DIAGNOSIS — M545 Low back pain: Principal | ICD-10-CM

## 2017-11-07 DIAGNOSIS — M1711 Unilateral primary osteoarthritis, right knee: Secondary | ICD-10-CM

## 2017-11-07 MED ORDER — HYDROCODONE-ACETAMINOPHEN 5-325 MG PO TABS: 1.0000 | ORAL_TABLET | Freq: Three times a day (TID) | ORAL | 0 refills | Status: DC | PRN

## 2017-11-28 ENCOUNTER — Telehealth: Payer: Self-pay | Admitting: Orthopedic Surgery

## 2017-11-28 NOTE — Telephone Encounter (Signed)
Hydrocodone-Acetaminophen  5/325 mg  Qty  42 Tablets  Take 1 tablet by mouth every 8 (eight) hours as needed for moderate pain.  PATIENT USES [facility]

## 2017-11-29 ENCOUNTER — Other Ambulatory Visit: Payer: Self-pay | Admitting: Orthopedic Surgery

## 2017-11-29 DIAGNOSIS — M1711 Unilateral primary osteoarthritis, right knee: Secondary | ICD-10-CM

## 2017-11-29 DIAGNOSIS — M545 Low back pain, unspecified: Secondary | ICD-10-CM

## 2017-11-29 DIAGNOSIS — G8929 Other chronic pain: Secondary | ICD-10-CM

## 2017-11-29 MED ORDER — HYDROCODONE-ACETAMINOPHEN 5-325 MG PO TABS: 1.0000 | ORAL_TABLET | Freq: Three times a day (TID) | ORAL | 0 refills | Status: DC | PRN

## 2017-12-18 ENCOUNTER — Ambulatory Visit: Payer: Medicare Other | Admitting: Orthopedic Surgery

## 2017-12-30 ENCOUNTER — Ambulatory Visit: Payer: Medicare Other | Admitting: Orthopedic Surgery

## 2018-01-13 ENCOUNTER — Encounter: Payer: Self-pay | Admitting: Orthopedic Surgery

## 2018-01-13 ENCOUNTER — Ambulatory Visit (INDEPENDENT_AMBULATORY_CARE_PROVIDER_SITE_OTHER): Payer: Medicare Other | Admitting: Orthopedic Surgery

## 2018-01-13 ENCOUNTER — Ambulatory Visit (INDEPENDENT_AMBULATORY_CARE_PROVIDER_SITE_OTHER): Payer: Medicare Other

## 2018-01-13 VITALS — BP 114/72 | HR 92 | Ht 64.0 in | Wt 212.0 lb

## 2018-01-13 DIAGNOSIS — M170 Bilateral primary osteoarthritis of knee: Secondary | ICD-10-CM | POA: Diagnosis not present

## 2018-01-13 DIAGNOSIS — M1710 Unilateral primary osteoarthritis, unspecified knee: Secondary | ICD-10-CM | POA: Diagnosis not present

## 2018-01-13 DIAGNOSIS — M1711 Unilateral primary osteoarthritis, right knee: Secondary | ICD-10-CM

## 2018-01-13 DIAGNOSIS — M545 Low back pain, unspecified: Secondary | ICD-10-CM

## 2018-01-13 DIAGNOSIS — G8929 Other chronic pain: Secondary | ICD-10-CM

## 2018-01-13 MED ORDER — HYDROCODONE-ACETAMINOPHEN 5-325 MG PO TABS: 1.0000 | ORAL_TABLET | Freq: Three times a day (TID) | ORAL | 0 refills | Status: DC | PRN

## 2018-01-13 NOTE — Progress Notes (Signed)
Progress Note   Patient ID: Shannon Gallegos, female   DOB: Aug 19, 1950, 67 y.o.   MRN: [id number]   Chief Complaint  Patient presents with  . Knee Pain    Bilat     HPI Follow-up visit regarding both knees  The right knee had a patellofemoral realignment procedure many years ago developed arthritis the left knee has had arthroscopy the patient has bilateral knee arthritis currently on hydrocodone and is in our chronic pain management protocol  She is doing reasonably well in both knees with bilateral aching knee pain which is intermittent ongoing for long period of time dull in quality moderate to severe depending on activity associated with intermittent stiffness and swelling  Review of Systems  Constitutional: Negative for fever.  Cardiovascular: Positive for leg swelling.  Musculoskeletal: Positive for back pain.  Skin: Negative for rash.  Neurological: Negative for tingling.   Current Meds  Medication Sig  . atorvastatin (LIPITOR) 40 MG tablet Take 40 mg by mouth daily.  . diclofenac (VOLTAREN) 75 MG EC tablet Take 1 tablet (75 mg total) by mouth 2 (two) times daily.  Marland Kitchen esomeprazole (NEXIUM) 40 MG capsule Take 40 mg by mouth daily at 12 noon.  . fluticasone (FLONASE) 50 MCG/ACT nasal spray Place 2 sprays into the nose daily.  . furosemide (LASIX) 20 MG tablet   . gabapentin (NEURONTIN) 300 MG capsule Take 300 mg by mouth 3 (three) times daily.  . hydrochlorothiazide (HYDRODIURIL) 25 MG tablet Take 25 mg by mouth daily.  Marland Kitchen HYDROcodone-acetaminophen (NORCO/VICODIN) 5-325 MG tablet Take 1 tablet by mouth every 8 (eight) hours as needed for moderate pain.  . meclizine (ANTIVERT) 25 MG tablet Take 25 mg by mouth 3 (three) times daily as needed for dizziness.   Marland Kitchen omeprazole (PRILOSEC) 40 MG capsule   . potassium chloride SA (K-DUR,KLOR-CON) 20 MEQ tablet Take 1 tablet (20 mEq total) by mouth daily.  . traZODone (DESYREL) 50 MG tablet Take 50 mg by mouth at bedtime.  . triamcinolone  (KENALOG) 0.025 % cream Apply 1 application topically 3 (three) times daily.  . [DISCONTINUED] HYDROcodone-acetaminophen (NORCO/VICODIN) 5-325 MG tablet Take 1 tablet by mouth every 8 (eight) hours as needed for moderate pain.    Past Medical History:  Diagnosis Date  . Arthritis   . Borderline diabetes   . GERD (gastroesophageal reflux disease)   . Hypercholesterolemia   . Hypertension   . Vertigo      No Known Allergies   BP 114/72   Pulse 92   Ht 5\' 4"  (1.626 m)   Wt 212 lb (96.2 kg)   BMI 36.39 kg/m    Physical Exam General appearance normal Oriented x3 normal Mood pleasant affect normal Gait normal   Right Knee Exam   Muscle Strength  The patient has normal right knee strength.  Tenderness  The patient is experiencing tenderness in the medial joint line and patella.  Range of Motion  Extension: normal  Flexion: abnormal   Tests  McMurray:  Medial - negative  Lachman:  Anterior - negative    Posterior - negative  Other  Erythema: absent Scars: present Sensation: normal Pulse: present Swelling: none   Left Knee Exam   Muscle Strength  The patient has normal left knee strength.  Tenderness  The patient is experiencing tenderness in the medial joint line.  Range of Motion  Extension: normal  Flexion: abnormal   Tests  McMurray:  Lateral - negative Lachman:  Anterior - negative  Posterior - negative  Other  Erythema: absent Scars: present Sensation: normal Pulse: present Swelling: none          MEDICAL DECISION MAKING   Imaging:  Please see dictated reports the right knee severe arthritis the left knee moderate arthritis   Encounter Diagnoses  Name Primary?  . Primary osteoarthritis of both knees Yes  . Chronic bilateral low back pain without sciatica   . Arthritis of knee, right      PLAN: (RX., injection, surgery,frx,mri/ct, XR 2 body ares)  Procedure note left knee injection verbal consent was obtained to  inject left knee joint  Timeout was completed to confirm the site of injection  The medications used were 40 mg of Depo-Medrol and 1% lidocaine 3 cc  Anesthesia was provided by ethyl chloride and the skin was prepped with alcohol.  After cleaning the skin with alcohol a 20-gauge needle was used to inject the left knee joint. There were no complications. A sterile bandage was applied.   Procedure note right knee injection verbal consent was obtained to inject right knee joint  Timeout was completed to confirm the site of injection  The medications used were 40 mg of Depo-Medrol and 1% lidocaine 3 cc  Anesthesia was provided by ethyl chloride and the skin was prepped with alcohol.  After cleaning the skin with alcohol a 20-gauge needle was used to inject the right knee joint. There were no complications. A sterile bandage was applied.   Meds ordered this encounter  Medications  . HYDROcodone-acetaminophen (NORCO/VICODIN) 5-325 MG tablet    Sig: Take 1 tablet by mouth every 8 (eight) hours as needed for moderate pain.    Dispense:  42 tablet    Refill:  0   2:28 PM 01/13/2018

## 2018-02-26 ENCOUNTER — Other Ambulatory Visit: Payer: Self-pay | Admitting: Orthopedic Surgery

## 2018-02-26 DIAGNOSIS — G8929 Other chronic pain: Secondary | ICD-10-CM

## 2018-02-26 DIAGNOSIS — M545 Low back pain, unspecified: Secondary | ICD-10-CM

## 2018-02-26 DIAGNOSIS — M1711 Unilateral primary osteoarthritis, right knee: Secondary | ICD-10-CM

## 2018-02-26 MED ORDER — HYDROCODONE-ACETAMINOPHEN 5-325 MG PO TABS: 1.0000 | ORAL_TABLET | Freq: Three times a day (TID) | ORAL | 0 refills | Status: DC | PRN

## 2018-02-26 NOTE — Telephone Encounter (Signed)
Patient called for refill:  HYDROcodone-acetaminophen (NORCO/VICODIN) 5-325 MG tablet 42 tablet  -[facility]

## 2018-04-15 ENCOUNTER — Other Ambulatory Visit: Payer: Self-pay | Admitting: Orthopedic Surgery

## 2018-04-15 DIAGNOSIS — M545 Low back pain, unspecified: Secondary | ICD-10-CM

## 2018-04-15 DIAGNOSIS — M1711 Unilateral primary osteoarthritis, right knee: Secondary | ICD-10-CM

## 2018-04-15 DIAGNOSIS — G8929 Other chronic pain: Secondary | ICD-10-CM

## 2018-04-15 NOTE — Telephone Encounter (Signed)
Patient called for refill:  HYDROcodone-acetaminophen (NORCO/VICODIN) 5-325 MG tablet 42 tablet 0 02/26/2018   -[facility]

## 2018-04-16 MED ORDER — HYDROCODONE-ACETAMINOPHEN 5-325 MG PO TABS: 1.0000 | ORAL_TABLET | Freq: Three times a day (TID) | ORAL | 0 refills | Status: DC | PRN

## 2018-06-09 ENCOUNTER — Telehealth: Payer: Self-pay | Admitting: Orthopedic Surgery

## 2018-06-09 ENCOUNTER — Other Ambulatory Visit: Payer: Self-pay | Admitting: Orthopedic Surgery

## 2018-06-09 DIAGNOSIS — G8929 Other chronic pain: Secondary | ICD-10-CM

## 2018-06-09 DIAGNOSIS — M545 Low back pain, unspecified: Secondary | ICD-10-CM

## 2018-06-09 DIAGNOSIS — M1711 Unilateral primary osteoarthritis, right knee: Secondary | ICD-10-CM

## 2018-06-09 MED ORDER — HYDROCODONE-ACETAMINOPHEN 5-325 MG PO TABS: 1.0000 | ORAL_TABLET | Freq: Three times a day (TID) | ORAL | 0 refills | Status: DC | PRN

## 2018-06-09 NOTE — Telephone Encounter (Signed)
Patient called for refill:  HYDROcodone-acetaminophen (NORCO/VICODIN) 5-325 MG tablet 42 tablet   - [facility]     - patient aware of upcoming yearly visit in October, however, due to medication, is a 6 month follow up/medication management visit needed in April?

## 2018-07-30 ENCOUNTER — Other Ambulatory Visit: Payer: Self-pay | Admitting: Orthopedic Surgery

## 2018-07-30 DIAGNOSIS — G8929 Other chronic pain: Secondary | ICD-10-CM

## 2018-07-30 DIAGNOSIS — M1711 Unilateral primary osteoarthritis, right knee: Secondary | ICD-10-CM

## 2018-07-30 DIAGNOSIS — M545 Low back pain: Principal | ICD-10-CM

## 2018-07-30 MED ORDER — HYDROCODONE-ACETAMINOPHEN 5-325 MG PO TABS: 1.0000 | ORAL_TABLET | Freq: Three times a day (TID) | ORAL | 0 refills | Status: DC | PRN

## 2018-07-30 NOTE — Telephone Encounter (Signed)
Patient requests refill on Hydrocodone/Acetaminophen 5-325 mgs.   Qty  40        Sig: Take 1 tablet by mouth every 8 (eight) hours as needed for moderate pain.    She states she would like for [place] to fax this in to [facility] (mail order)  Fax # [phone]   If there is a problem doing the mail order, just go ahead and send to [facility] per pt  Thanks

## 2018-09-23 ENCOUNTER — Other Ambulatory Visit: Payer: Self-pay | Admitting: Orthopedic Surgery

## 2018-09-23 DIAGNOSIS — G8929 Other chronic pain: Secondary | ICD-10-CM

## 2018-09-23 DIAGNOSIS — M1711 Unilateral primary osteoarthritis, right knee: Secondary | ICD-10-CM

## 2018-09-23 MED ORDER — HYDROCODONE-ACETAMINOPHEN 5-325 MG PO TABS: 1.0000 | ORAL_TABLET | Freq: Three times a day (TID) | ORAL | 0 refills | Status: DC | PRN

## 2018-09-23 NOTE — Telephone Encounter (Signed)
Patient called for refill: HYDROcodone-acetaminophen (NORCO/VICODIN) 5-325 MG tablet 42 tablet 0  -[facility]   - Patient's last visit was 01/13/18; aware of upcoming appointment 01/12/2019.

## 2018-10-15 ENCOUNTER — Other Ambulatory Visit: Payer: Self-pay | Admitting: Family Medicine

## 2018-10-15 DIAGNOSIS — Z1231 Encounter for screening mammogram for malignant neoplasm of breast: Secondary | ICD-10-CM

## 2018-11-06 ENCOUNTER — Other Ambulatory Visit: Payer: Self-pay | Admitting: Orthopedic Surgery

## 2018-11-06 DIAGNOSIS — M1711 Unilateral primary osteoarthritis, right knee: Secondary | ICD-10-CM

## 2018-11-06 DIAGNOSIS — G8929 Other chronic pain: Secondary | ICD-10-CM

## 2018-11-06 DIAGNOSIS — M545 Low back pain, unspecified: Secondary | ICD-10-CM

## 2018-11-06 NOTE — Telephone Encounter (Signed)
Patient requests refill on Hydrocodone/Acetaminophen 5-325  Mgs.  Qty  42  Sig: Take 1 tablet by mouth every 8 (eight) hours as needed for moderate pain.  Patient states she uses [facility] 

## 2018-11-07 MED ORDER — HYDROCODONE-ACETAMINOPHEN 5-325 MG PO TABS: 1.0000 | ORAL_TABLET | Freq: Three times a day (TID) | ORAL | 0 refills | Status: DC | PRN

## 2018-11-18 DIAGNOSIS — L281 Prurigo nodularis: Secondary | ICD-10-CM | POA: Insufficient documentation

## 2018-11-18 DIAGNOSIS — M170 Bilateral primary osteoarthritis of knee: Secondary | ICD-10-CM | POA: Insufficient documentation

## 2018-11-18 DIAGNOSIS — E6609 Other obesity due to excess calories: Secondary | ICD-10-CM | POA: Insufficient documentation

## 2018-11-18 DIAGNOSIS — I890 Lymphedema, not elsewhere classified: Secondary | ICD-10-CM | POA: Insufficient documentation

## 2018-11-18 DIAGNOSIS — R768 Other specified abnormal immunological findings in serum: Secondary | ICD-10-CM | POA: Insufficient documentation

## 2018-11-18 DIAGNOSIS — M791 Myalgia, unspecified site: Secondary | ICD-10-CM | POA: Insufficient documentation

## 2018-12-03 DIAGNOSIS — E669 Obesity, unspecified: Secondary | ICD-10-CM | POA: Insufficient documentation

## 2018-12-11 ENCOUNTER — Other Ambulatory Visit: Payer: Self-pay | Admitting: Orthopedic Surgery

## 2018-12-11 ENCOUNTER — Telehealth: Payer: Self-pay | Admitting: Orthopedic Surgery

## 2018-12-11 DIAGNOSIS — M1711 Unilateral primary osteoarthritis, right knee: Secondary | ICD-10-CM

## 2018-12-11 DIAGNOSIS — G8929 Other chronic pain: Secondary | ICD-10-CM

## 2018-12-11 MED ORDER — HYDROCODONE-ACETAMINOPHEN 5-325 MG PO TABS: 1.0000 | ORAL_TABLET | Freq: Three times a day (TID) | ORAL | 0 refills | Status: DC | PRN

## 2018-12-11 NOTE — Telephone Encounter (Signed)
Patient requests refill on Hydrocodone/Acetaminophen 5-325  Mgs.  Qty  42  Sig: Take 1 tablet by mouth every 8 (eight) hours as needed for moderate pain.  Patient states she uses [facility] 

## 2018-12-17 ENCOUNTER — Ambulatory Visit
Admission: RE | Admit: 2018-12-17 | Discharge: 2018-12-17 | Disposition: A | Discharge status: Home / Self Care | Payer: Medicare HMO | Source: Ambulatory Visit | Attending: Family Medicine | Admitting: Family Medicine

## 2018-12-17 DIAGNOSIS — Z1231 Encounter for screening mammogram for malignant neoplasm of breast: Secondary | ICD-10-CM | POA: Diagnosis not present

## 2018-12-29 ENCOUNTER — Encounter (INDEPENDENT_AMBULATORY_CARE_PROVIDER_SITE_OTHER): Payer: Medicare HMO | Admitting: Vascular Surgery

## 2019-01-12 ENCOUNTER — Ambulatory Visit: Payer: Self-pay | Admitting: Orthopedic Surgery

## 2019-01-19 ENCOUNTER — Ambulatory Visit: Payer: Medicare HMO

## 2019-01-19 ENCOUNTER — Other Ambulatory Visit: Payer: Self-pay

## 2019-01-19 ENCOUNTER — Ambulatory Visit: Payer: Medicare HMO | Admitting: Orthopedic Surgery

## 2019-01-19 ENCOUNTER — Encounter: Payer: Self-pay | Admitting: Orthopedic Surgery

## 2019-01-19 VITALS — BP 152/78 | HR 79 | Ht 64.0 in | Wt 204.0 lb

## 2019-01-19 DIAGNOSIS — M545 Low back pain, unspecified: Secondary | ICD-10-CM

## 2019-01-19 DIAGNOSIS — M1712 Unilateral primary osteoarthritis, left knee: Secondary | ICD-10-CM | POA: Diagnosis not present

## 2019-01-19 DIAGNOSIS — M1711 Unilateral primary osteoarthritis, right knee: Secondary | ICD-10-CM | POA: Diagnosis not present

## 2019-01-19 DIAGNOSIS — G8929 Other chronic pain: Secondary | ICD-10-CM | POA: Diagnosis not present

## 2019-01-19 DIAGNOSIS — M170 Bilateral primary osteoarthritis of knee: Secondary | ICD-10-CM

## 2019-01-19 MED ORDER — HYDROCODONE-ACETAMINOPHEN 5-325 MG PO TABS: 1.0000 | ORAL_TABLET | Freq: Three times a day (TID) | ORAL | 0 refills | Status: DC | PRN

## 2019-01-19 NOTE — Progress Notes (Signed)
Progress Note   Patient ID: Shannon Gallegos, female   DOB: [birth year], 68 y.o.   MRN: [id number]   Chief Complaint  Patient presents with  . Knee Pain    bilateral     Encounter Diagnoses  Name Primary?  Marland Kitchen Arthritis of knee, right Yes  . Arthritis of left knee     Shannon Gallegos comes in for her yearly follow-up visits with bilateral knee pain right greater than left status post patellofemoral surgery back in the early 90s I believe I did a scope on her left knee as well  She has intermittent pain uses topical Voltaren oral over-the-counter's and occasional hydrocodone with good pain relief  She is very functional with her activities of daily living she is able to do most things that she wants to do with some occasional exacerbations    Review of Systems  Constitutional: Negative for chills and fever.  Musculoskeletal: Positive for back pain.  Neurological: Negative for tingling.      BP (!) 152/78   Pulse 79   Ht 5\' 4"  (1.626 m)   Wt 204 lb (92.5 kg)   BMI 35.02 kg/m   Physical Exam  Vitals reviewed stable  Appearance normal oriented x3 mood and affect normal  Gait and station also normal  Right knee no effusion mild joint line tenderness with direct crepitance range of motion 115 degrees knee stable strength normal skin intact sensation normal  Left knee medial lateral joint line tenderness no effusion range of motion 120 degrees stable ligament strength normal skin excellent condition sensation normal   Medical decisions:  (Established problem worse, x-ray ,physical therapy, over-the-counter medicines, read outside film or summarize x-ray)  Data  Imaging:   See report mild to mod oa both knees   Encounter Diagnoses  Name Primary?  Marland Kitchen Arthritis of knee, right Yes  . Arthritis of left knee   .injb  PLAN:   Shannon Gallegos   Procedure note for bilateral knee injections  Procedure note left knee injection verbal consent was obtained to inject left knee  joint  Timeout was completed to confirm the site of injection  The medications used were 40 mg of Depo-Medrol and 1% lidocaine 3 cc  Anesthesia was provided by ethyl chloride and the skin was prepped with alcohol.  After cleaning the skin with alcohol a 20-gauge needle was used to inject the left knee joint. There were no complications. A sterile bandage was applied.   Procedure note right knee injection verbal consent was obtained to inject right knee joint  Timeout was completed to confirm the site of injection  The medications used were 40 mg of Depo-Medrol and 1% lidocaine 3 cc  Anesthesia was provided by ethyl chloride and the skin was prepped with alcohol.  After cleaning the skin with alcohol a 20-gauge needle was used to inject the right knee joint. There were no complications. A sterile bandage was applied.   Meds ordered this encounter  Medications  . HYDROcodone-acetaminophen (NORCO/VICODIN) 5-325 MG tablet    Sig: Take 1 tablet by mouth every 8 (eight) hours as needed for moderate pain.    Dispense:  42 tablet    Refill:  0    Arther Abbott, MD 01/19/2019 2:55 PM

## 2019-02-18 ENCOUNTER — Other Ambulatory Visit: Payer: Self-pay | Admitting: Orthopedic Surgery

## 2019-02-18 DIAGNOSIS — M1711 Unilateral primary osteoarthritis, right knee: Secondary | ICD-10-CM

## 2019-02-18 DIAGNOSIS — G8929 Other chronic pain: Secondary | ICD-10-CM

## 2019-02-18 MED ORDER — HYDROCODONE-ACETAMINOPHEN 5-325 MG PO TABS: 1.0000 | ORAL_TABLET | Freq: Three times a day (TID) | ORAL | 0 refills | Status: DC | PRN

## 2019-02-18 NOTE — Telephone Encounter (Signed)
Patient requests refill on Hydrocodone/Acetaminophen 5-325  Mgs.  Qty  42  Sig: Take 1 tablet by mouth every 8 (eight) hours as needed for moderate pain.  Patient states she uses [facility] 

## 2019-03-24 ENCOUNTER — Other Ambulatory Visit: Payer: Self-pay | Admitting: Orthopedic Surgery

## 2019-03-24 DIAGNOSIS — M545 Low back pain, unspecified: Secondary | ICD-10-CM

## 2019-03-24 DIAGNOSIS — M1711 Unilateral primary osteoarthritis, right knee: Secondary | ICD-10-CM

## 2019-03-24 DIAGNOSIS — G8929 Other chronic pain: Secondary | ICD-10-CM

## 2019-03-24 NOTE — Telephone Encounter (Signed)
Patient requests refill on Hydrocodone/Acetaminophen 5-325  Mgs.  Qty  42  Sig: Take 1 tablet by mouth every 8 (eight) hours as needed for moderate pain.  Patient states she uses [facility] 

## 2019-03-25 MED ORDER — HYDROCODONE-ACETAMINOPHEN 5-325 MG PO TABS: 1.0000 | ORAL_TABLET | Freq: Three times a day (TID) | ORAL | 0 refills | Status: DC | PRN

## 2019-04-02 DIAGNOSIS — R768 Other specified abnormal immunological findings in serum: Secondary | ICD-10-CM | POA: Insufficient documentation

## 2019-04-30 ENCOUNTER — Telehealth: Payer: Self-pay | Admitting: Orthopedic Surgery

## 2019-04-30 NOTE — Telephone Encounter (Signed)
Patient requests refill on Hydrocodone/Acetaminophen 5-325  Mgs.   Qty  42  Sig: Take 1 tablet by mouth every 8 (eight) hours as needed for moderate pain.  [facility]  

## 2019-05-07 ENCOUNTER — Other Ambulatory Visit: Payer: Self-pay | Admitting: Orthopedic Surgery

## 2019-05-07 DIAGNOSIS — M1711 Unilateral primary osteoarthritis, right knee: Secondary | ICD-10-CM

## 2019-05-07 DIAGNOSIS — G8929 Other chronic pain: Secondary | ICD-10-CM

## 2019-05-07 NOTE — Telephone Encounter (Signed)
Patient requests refill on Hydrocodone/Acetaminophen 5-325  Mgs.  Qty  42  Sig: Take 1 tablet by mouth every 8 (eight) hours as needed for moderate pain.  Patient states she uses [facility] 

## 2019-05-08 MED ORDER — HYDROCODONE-ACETAMINOPHEN 5-325 MG PO TABS: 1.0000 | ORAL_TABLET | Freq: Three times a day (TID) | ORAL | 0 refills | Status: DC | PRN

## 2019-06-24 ENCOUNTER — Other Ambulatory Visit: Payer: Self-pay | Admitting: Radiology

## 2019-06-24 DIAGNOSIS — M545 Low back pain, unspecified: Secondary | ICD-10-CM

## 2019-06-24 DIAGNOSIS — M1711 Unilateral primary osteoarthritis, right knee: Secondary | ICD-10-CM

## 2019-06-24 DIAGNOSIS — G8929 Other chronic pain: Secondary | ICD-10-CM

## 2019-06-24 MED ORDER — HYDROCODONE-ACETAMINOPHEN 5-325 MG PO TABS: 1.0000 | ORAL_TABLET | Freq: Three times a day (TID) | ORAL | 0 refills | Status: DC | PRN

## 2019-07-23 ENCOUNTER — Other Ambulatory Visit: Payer: Self-pay | Admitting: Orthopedic Surgery

## 2019-07-23 DIAGNOSIS — M1711 Unilateral primary osteoarthritis, right knee: Secondary | ICD-10-CM

## 2019-07-23 DIAGNOSIS — G8929 Other chronic pain: Secondary | ICD-10-CM

## 2019-07-23 MED ORDER — HYDROCODONE-ACETAMINOPHEN 5-325 MG PO TABS: 1.0000 | ORAL_TABLET | Freq: Three times a day (TID) | ORAL | 0 refills | Status: DC | PRN

## 2019-07-23 NOTE — Telephone Encounter (Signed)
Patient requests refill on Hydrocodone/Acetaminophen 5-325 mgs.  Qty  42  Sig: Take 1 tablet by mouth every 8 (eight) hours as needed for moderate pain.  Patient states she uses [facility]

## 2019-08-07 ENCOUNTER — Encounter: Payer: Self-pay | Admitting: Orthopedic Surgery

## 2019-08-07 ENCOUNTER — Ambulatory Visit: Payer: Medicare HMO

## 2019-08-07 ENCOUNTER — Other Ambulatory Visit: Payer: Self-pay

## 2019-08-07 ENCOUNTER — Ambulatory Visit: Payer: Medicare HMO | Admitting: Orthopedic Surgery

## 2019-08-07 VITALS — BP 149/96 | HR 90 | Ht 62.0 in | Wt 196.0 lb

## 2019-08-07 DIAGNOSIS — M25511 Pain in right shoulder: Secondary | ICD-10-CM | POA: Diagnosis not present

## 2019-08-07 DIAGNOSIS — G8929 Other chronic pain: Secondary | ICD-10-CM

## 2019-08-07 NOTE — Progress Notes (Signed)
NEW PROBLEM//OFFICE VISIT  Chief Complaint  Patient presents with  . Shoulder Pain    right     I am seeing Ms. Alamillo today for problems she is having with her right shoulder which began when she fell back in 2018.  She comes in with intermittent pain which increases when she is lying on her right shoulder and when present associated with difficulty raising her arm.   ROS  Dizziness excessive drinking of water back and joint pain leg swelling otherwise normal Past Medical History:  Diagnosis Date  . Arthritis   . Borderline diabetes   . GERD (gastroesophageal reflux disease)   . Hypercholesterolemia   . Hypertension   . Vertigo     Past Surgical History:  Procedure Laterality Date  . BREAST BIOPSY Left 2000   neg  . CATARACT EXTRACTION Bilateral   . COLONOSCOPY  March 2014   Dr. Ellender Hose: sigmoid and descending colon diverticula, ADENOMATOUS polyp. Due for surveillance 2019  . ESOPHAGOGASTRODUODENOSCOPY (EGD) WITH ESOPHAGEAL DILATION N/A 12/09/2012   Dr. Oneida Alar: small hiatal hernia, Schatzki's ring at GE junction s/p Savary dilation, benign sessile polyps, moderate gastritis  . KNEE ARTHROSCOPY WITH MEDIAL MENISECTOMY Left 04/28/2015   Procedure: LEFT KNEE ARTHROSCOPY WITH PARTIAL MEDIAL MENISECTOMY;  Surgeon: Carole Civil, MD;  Location: [facility];  Service: Orthopedics;  Laterality: Left;  . KNEE SURGERY Bilateral    arthroscopies  . TUBAL LIGATION      Family History  Problem Relation Age of Onset  . Cirrhosis Father        alcoholic  . Colon cancer Neg Hx   . Breast cancer Neg Hx    Social History   Tobacco Use  . Smoking status: Never Smoker  . Smokeless tobacco: Never Used  Substance Use Topics  . Alcohol use: No  . Drug use: No    No Known Allergies  Current Meds  Medication Sig  . atorvastatin (LIPITOR) 40 MG tablet Take 40 mg by mouth daily.  . Cholecalciferol (VITAMIN D3) 25 MCG (1000 UT) CAPS Take by mouth.  . diclofenac (VOLTAREN) 75 MG EC  tablet Take 1 tablet (75 mg total) by mouth 2 (two) times daily.  Marland Kitchen esomeprazole (NEXIUM) 40 MG capsule Take 40 mg by mouth daily at 12 noon.  . fluticasone (FLONASE) 50 MCG/ACT nasal spray Place 2 sprays into the nose daily.  . furosemide (LASIX) 20 MG tablet   . gabapentin (NEURONTIN) 300 MG capsule Take 300 mg by mouth 3 (three) times daily.  . hydrochlorothiazide (HYDRODIURIL) 25 MG tablet Take 25 mg by mouth daily.  Marland Kitchen HYDROcodone-acetaminophen (NORCO/VICODIN) 5-325 MG tablet Take 1 tablet by mouth every 8 (eight) hours as needed for moderate pain.  . meclizine (ANTIVERT) 25 MG tablet Take 25 mg by mouth 3 (three) times daily as needed for dizziness.   Marland Kitchen omeprazole (PRILOSEC) 40 MG capsule   . potassium chloride SA (K-DUR,KLOR-CON) 20 MEQ tablet Take 1 tablet (20 mEq total) by mouth daily.  . traZODone (DESYREL) 50 MG tablet Take 50 mg by mouth at bedtime.  . triamcinolone (KENALOG) 0.025 % cream Apply 1 application topically 3 (three) times daily.    BP (!) 149/96   Pulse 90   Ht 5\' 2"  (1.575 m)   Wt 196 lb (88.9 kg)   BMI 35.85 kg/m   Physical Exam Constitutional:      General: She is not in acute distress.    Appearance: She is well-developed.  Cardiovascular:  Comments: No peripheral edema Skin:    General: Skin is warm and dry.  Neurological:     Mental Status: She is alert and oriented to person, place, and time.     Sensory: No sensory deficit.     Coordination: Coordination normal.     Gait: Gait normal.     Deep Tendon Reflexes: Reflexes are normal and symmetric.     Right Shoulder Exam   Tenderness  Right shoulder tenderness location: Tenderness at the inferior border of the right scapula.  Range of Motion  The patient has normal right shoulder ROM.  Muscle Strength  The patient has normal right shoulder strength.  Tests  Apprehension: negative Impingement: positive  Other  Erythema: absent Sensation: normal Pulse: present   Left Shoulder  Exam  Left shoulder exam is normal.  Tenderness  The patient is experiencing no tenderness.   Range of Motion  The patient has normal left shoulder ROM.  Muscle Strength  The patient has normal left shoulder strength.  Tests  Apprehension: negative  Other  Erythema: absent Sensation: normal Pulse: present         MEDICAL DECISION MAKING  A.  Encounter Diagnosis  Name Primary?  . Chronic right shoulder pain Yes    B. DATA ANALYSED:    IMAGING: Independent interpretation of images: The x-ray of her right shoulder was actually done in the office it was normal except for a very small inferior bone spur forming at the inferior margin of the glenohumeral joint  Orders:   Outside records reviewed:   C. MANAGEMENT   #1 we are going to inject the medial border of the scapula  Injection for trigger point medial border the scapula site confirmed patient gave permission skin cleaned with alcohol ethyl chloride used for anesthesia 25-gauge needle 40 mg of Depo-Medrol 2 cc 1% lidocaine done without complication  Patient advised to follow-up on June for to evaluate her back pain which is a chronic problem  Acute uncomplicated problem today with injection and into office x-ray  No orders of the defined types were placed in this encounter.     Arther Abbott, MD  08/07/2019 9:48 AM

## 2019-08-17 ENCOUNTER — Encounter (HOSPITAL_COMMUNITY): Payer: Self-pay

## 2019-08-17 ENCOUNTER — Other Ambulatory Visit: Payer: Self-pay

## 2019-08-17 ENCOUNTER — Emergency Department (HOSPITAL_COMMUNITY)
Admission: EM | Admit: 2019-08-17 | Discharge: 2019-08-17 | Disposition: A | Discharge status: Home / Self Care | Payer: Medicare HMO | Attending: Emergency Medicine | Admitting: Emergency Medicine

## 2019-08-17 DIAGNOSIS — M549 Dorsalgia, unspecified: Secondary | ICD-10-CM | POA: Insufficient documentation

## 2019-08-17 DIAGNOSIS — Z5321 Procedure and treatment not carried out due to patient leaving prior to being seen by health care provider: Secondary | ICD-10-CM | POA: Diagnosis not present

## 2019-08-17 NOTE — ED Triage Notes (Signed)
Pt comes in from home with complaints of right sided back pain.  States that it is worse with movement. Started last night worse today . Denies any known injury

## 2019-08-28 ENCOUNTER — Other Ambulatory Visit: Payer: Self-pay

## 2019-08-28 ENCOUNTER — Ambulatory Visit: Payer: Medicare HMO

## 2019-08-28 ENCOUNTER — Encounter: Payer: Self-pay | Admitting: Orthopedic Surgery

## 2019-08-28 ENCOUNTER — Ambulatory Visit: Payer: Medicare HMO | Admitting: Orthopedic Surgery

## 2019-08-28 VITALS — BP 158/95 | HR 58 | Ht 62.0 in | Wt 198.5 lb

## 2019-08-28 DIAGNOSIS — G8929 Other chronic pain: Secondary | ICD-10-CM

## 2019-08-28 DIAGNOSIS — M1711 Unilateral primary osteoarthritis, right knee: Secondary | ICD-10-CM | POA: Diagnosis not present

## 2019-08-28 DIAGNOSIS — M545 Low back pain, unspecified: Secondary | ICD-10-CM | POA: Insufficient documentation

## 2019-08-28 MED ORDER — PREDNISONE 10 MG (48) PO TBPK: ORAL_TABLET | Freq: Every day | ORAL | 0 refills | Status: DC

## 2019-08-28 MED ORDER — DICLOFENAC SODIUM 75 MG PO TBEC: 75.0000 mg | DELAYED_RELEASE_TABLET | Freq: Two times a day (BID) | ORAL | 5 refills | Status: DC

## 2019-08-28 MED ORDER — HYDROCODONE-ACETAMINOPHEN 5-325 MG PO TABS: 1.0000 | ORAL_TABLET | Freq: Three times a day (TID) | ORAL | 0 refills | Status: DC | PRN

## 2019-08-28 MED ORDER — TIZANIDINE HCL 4 MG PO TABS: 4.0000 mg | ORAL_TABLET | Freq: Every day | ORAL | 1 refills | Status: DC

## 2019-08-28 NOTE — Progress Notes (Signed)
Chief Complaint  Patient presents with  . Back Pain    hurting on right side   69 year old female history of lumbar disc disease had an MRI back in 2017 x-rays in 2015  Complains of right-sided lower back pain primarily with occasional radiation into the right hip does not seem to extend below the right knee  No bowel or bladder dysfunction  Recently had a dose of prednisone in the form of a Dosepak which helped all of her joint pain including her back  Past Medical History:  Diagnosis Date  . Arthritis   . Borderline diabetes   . GERD (gastroesophageal reflux disease)   . Hypercholesterolemia   . Hypertension   . Vertigo    Past Surgical History:  Procedure Laterality Date  . BREAST BIOPSY Left 2000   neg  . CATARACT EXTRACTION Bilateral   . COLONOSCOPY  March 2014   Dr. Ellender Hose: sigmoid and descending colon diverticula, ADENOMATOUS polyp. Due for surveillance 2019  . ESOPHAGOGASTRODUODENOSCOPY (EGD) WITH ESOPHAGEAL DILATION N/A 12/09/2012   Dr. Oneida Alar: small hiatal hernia, Schatzki's ring at GE junction s/p Savary dilation, benign sessile polyps, moderate gastritis  . KNEE ARTHROSCOPY WITH MEDIAL MENISECTOMY Left 04/28/2015   Procedure: LEFT KNEE ARTHROSCOPY WITH PARTIAL MEDIAL MENISECTOMY;  Surgeon: Carole Civil, MD;  Location: [facility];  Service: Orthopedics;  Laterality: Left;  . KNEE SURGERY Bilateral    arthroscopies  . TUBAL LIGATION      BP (!) 158/95   Pulse (!) 58   Ht 5\' 2"  (1.575 m)   Wt 198 lb 8 oz (90 kg)   BMI 36.31 kg/m   She is awake alert and oriented x3 mood and affect are normal no specific gait abnormalities are encountered.  Mood affect pleasant  Tenderness in the lumbar spine normal lower extremity neurovascular motor function.  New x-rays were taken today: X-ray shows progressive scoliosis lumbar spine with persistent facet arthritis   2015 x-ray IMAGING  X-ray from 2015 read as chronic changes without acute abnormality mild scoliosis  concave left vertebral height maintained no anterolisthesis or retrolisthesis IMPRESSION: Curvature convex to the right with the apex at L3.   Left lateral recess and foraminal narrowing at L3-4 that could be symptomatic. There is bilateral facet arthropathy with facet and ligamentous hypertrophy and 2 mm of anterolisthesis. There is bulging of the disc.   Right lateral recess and foraminal stenosis at L4-5 because of shallow disc herniation more prominent towards the right a combination with facet and ligamentous hypertrophy more prominent on the right.   Grossly non-compressive degenerative changes at L2-3 and L5-S1.     Electronically Signed   By: Nelson Chimes M.D.   On: 07/29/2015 09:02   Encounter Diagnoses  Name Primary?  . Chronic right-sided low back pain without sciatica Yes  . Chronic bilateral low back pain without sciatica   . Arthritis of knee, right     Arthritis not addressed today in the knee area  Her back should be easily responsive to medication   Meds ordered this encounter  Medications  . predniSONE (STERAPRED UNI-PAK 48 TAB) 10 MG (48) TBPK tablet    Sig: Take by mouth daily. 10 mg ds 12 days    Dispense:  48 tablet    Refill:  0  . tiZANidine (ZANAFLEX) 4 MG tablet    Sig: Take 1 tablet (4 mg total) by mouth daily.    Dispense:  30 tablet    Refill:  1  .  diclofenac (VOLTAREN) 75 MG EC tablet    Sig: Take 1 tablet (75 mg total) by mouth 2 (two) times daily.    Dispense:  60 tablet    Refill:  5  . HYDROcodone-acetaminophen (NORCO/VICODIN) 5-325 MG tablet    Sig: Take 1 tablet by mouth every 8 (eight) hours as needed for moderate pain.    Dispense:  42 tablet    Refill:  0

## 2019-09-02 DIAGNOSIS — M159 Polyosteoarthritis, unspecified: Secondary | ICD-10-CM | POA: Insufficient documentation

## 2019-09-30 ENCOUNTER — Other Ambulatory Visit: Payer: Self-pay | Admitting: Orthopedic Surgery

## 2019-09-30 DIAGNOSIS — M545 Low back pain, unspecified: Secondary | ICD-10-CM

## 2019-09-30 DIAGNOSIS — M1711 Unilateral primary osteoarthritis, right knee: Secondary | ICD-10-CM

## 2019-09-30 MED ORDER — HYDROCODONE-ACETAMINOPHEN 5-325 MG PO TABS: 1.0000 | ORAL_TABLET | Freq: Three times a day (TID) | ORAL | 0 refills | Status: DC | PRN

## 2019-09-30 NOTE — Telephone Encounter (Signed)
Patient requests refill on Hydrocodone/Acetaminophen 5-325  Mgs.   Qty  42  Sig: Take 1 tablet by mouth every 8 (eight) hours as needed for moderate pain.  Patient states she uses [facility]

## 2019-10-09 ENCOUNTER — Other Ambulatory Visit: Payer: Self-pay | Admitting: Radiology

## 2019-10-09 DIAGNOSIS — G8929 Other chronic pain: Secondary | ICD-10-CM

## 2019-10-09 DIAGNOSIS — M1711 Unilateral primary osteoarthritis, right knee: Secondary | ICD-10-CM

## 2019-10-09 MED ORDER — DICLOFENAC SODIUM 75 MG PO TBEC: 75.0000 mg | DELAYED_RELEASE_TABLET | Freq: Two times a day (BID) | ORAL | 5 refills | Status: DC

## 2019-10-13 ENCOUNTER — Other Ambulatory Visit: Payer: Self-pay | Admitting: Radiology

## 2019-10-13 DIAGNOSIS — M1711 Unilateral primary osteoarthritis, right knee: Secondary | ICD-10-CM

## 2019-10-13 DIAGNOSIS — G8929 Other chronic pain: Secondary | ICD-10-CM

## 2019-10-13 MED ORDER — TIZANIDINE HCL 4 MG PO TABS: 4.0000 mg | ORAL_TABLET | Freq: Every day | ORAL | 1 refills | Status: AC

## 2019-10-13 MED ORDER — DICLOFENAC SODIUM 75 MG PO TBEC: 75.0000 mg | DELAYED_RELEASE_TABLET | Freq: Two times a day (BID) | ORAL | 5 refills | Status: DC

## 2019-10-13 NOTE — Telephone Encounter (Signed)
Refill request faxed from [facility].

## 2019-11-05 ENCOUNTER — Other Ambulatory Visit: Payer: Self-pay | Admitting: Orthopedic Surgery

## 2019-11-05 DIAGNOSIS — G8929 Other chronic pain: Secondary | ICD-10-CM

## 2019-11-05 DIAGNOSIS — M545 Low back pain, unspecified: Secondary | ICD-10-CM

## 2019-11-05 DIAGNOSIS — M1711 Unilateral primary osteoarthritis, right knee: Secondary | ICD-10-CM

## 2019-11-05 NOTE — Telephone Encounter (Signed)
Patient requests refill on Hydrocodone/Acetaminophen 5-325  Mgs.  Qty  42  Sig: Take 1 tablet by mouth every 8 (eight) hours as needed for moderate pain.  Patient states she uses [facility]

## 2019-11-06 MED ORDER — HYDROCODONE-ACETAMINOPHEN 5-325 MG PO TABS: 1.0000 | ORAL_TABLET | Freq: Three times a day (TID) | ORAL | 0 refills | Status: DC | PRN

## 2019-11-19 ENCOUNTER — Telehealth: Payer: Self-pay | Admitting: Orthopedic Surgery

## 2019-11-19 ENCOUNTER — Other Ambulatory Visit: Payer: Self-pay | Admitting: Orthopedic Surgery

## 2019-11-19 DIAGNOSIS — M545 Low back pain, unspecified: Secondary | ICD-10-CM

## 2019-11-19 MED ORDER — PREDNISONE 10 MG (48) PO TBPK: ORAL_TABLET | Freq: Every day | ORAL | 0 refills | Status: DC

## 2019-11-19 NOTE — Progress Notes (Signed)
Meds ordered this encounter  Medications  . predniSONE (STERAPRED UNI-PAK 48 TAB) 10 MG (48) TBPK tablet    Sig: Take by mouth daily. 10 mg ds 12 days    Dispense:  48 tablet    Refill:  0

## 2019-11-19 NOTE — Telephone Encounter (Signed)
Call received from patient; relays that her back pain is recurring, as per last visit for this problem 08/28/19. Asking if she may either have a refill on medication: predniSONE (STERAPRED UNI-PAK 48 TAB) 10 MG (48) TBPK tablet 48 tablet   -[facility], if approved for refill   or if she may proceed with epidural steroid injection. States she is leaving to be away on 12/03/19; aware that Dr Aline Brochure currently does not have any openings before this date.  Please advise.

## 2019-11-19 NOTE — Telephone Encounter (Signed)
Please advise 

## 2019-11-26 ENCOUNTER — Other Ambulatory Visit: Payer: Self-pay | Admitting: Family Medicine

## 2019-11-26 DIAGNOSIS — Z1231 Encounter for screening mammogram for malignant neoplasm of breast: Secondary | ICD-10-CM

## 2019-12-28 ENCOUNTER — Other Ambulatory Visit: Payer: Self-pay

## 2019-12-28 ENCOUNTER — Ambulatory Visit
Admission: RE | Admit: 2019-12-28 | Discharge: 2019-12-28 | Disposition: A | Discharge status: Home / Self Care | Payer: Medicare HMO | Source: Ambulatory Visit | Attending: Family Medicine | Admitting: Family Medicine

## 2019-12-28 DIAGNOSIS — Z1231 Encounter for screening mammogram for malignant neoplasm of breast: Secondary | ICD-10-CM | POA: Insufficient documentation

## 2019-12-31 ENCOUNTER — Other Ambulatory Visit: Payer: Self-pay | Admitting: Family Medicine

## 2019-12-31 DIAGNOSIS — R928 Other abnormal and inconclusive findings on diagnostic imaging of breast: Secondary | ICD-10-CM

## 2019-12-31 DIAGNOSIS — R2231 Localized swelling, mass and lump, right upper limb: Secondary | ICD-10-CM

## 2020-01-04 ENCOUNTER — Other Ambulatory Visit: Payer: Self-pay | Admitting: Orthopedic Surgery

## 2020-01-04 DIAGNOSIS — G8929 Other chronic pain: Secondary | ICD-10-CM

## 2020-01-04 DIAGNOSIS — M1711 Unilateral primary osteoarthritis, right knee: Secondary | ICD-10-CM

## 2020-01-04 MED ORDER — HYDROCODONE-ACETAMINOPHEN 5-325 MG PO TABS: 1.0000 | ORAL_TABLET | Freq: Three times a day (TID) | ORAL | 0 refills | Status: DC | PRN

## 2020-01-04 NOTE — Telephone Encounter (Signed)
Patient requests refill on Hydrocodone/Acetaminophen 5-325  Mgs.  Qty  42  Sig: Take 1 tablet by mouth every 8 (eight) hours as needed for moderate pain.  Patient states she uses [facility]

## 2020-01-05 ENCOUNTER — Ambulatory Visit
Admission: RE | Admit: 2020-01-05 | Discharge: 2020-01-05 | Disposition: A | Discharge status: Home / Self Care | Payer: Medicare HMO | Source: Ambulatory Visit | Attending: Family Medicine | Admitting: Family Medicine

## 2020-01-05 ENCOUNTER — Other Ambulatory Visit: Payer: Self-pay

## 2020-01-05 DIAGNOSIS — R928 Other abnormal and inconclusive findings on diagnostic imaging of breast: Secondary | ICD-10-CM | POA: Diagnosis not present

## 2020-01-05 DIAGNOSIS — R2231 Localized swelling, mass and lump, right upper limb: Secondary | ICD-10-CM | POA: Diagnosis present

## 2020-01-06 ENCOUNTER — Other Ambulatory Visit: Payer: Self-pay | Admitting: Family Medicine

## 2020-01-06 DIAGNOSIS — R928 Other abnormal and inconclusive findings on diagnostic imaging of breast: Secondary | ICD-10-CM

## 2020-01-18 ENCOUNTER — Encounter: Payer: Self-pay | Admitting: Orthopedic Surgery

## 2020-01-18 ENCOUNTER — Ambulatory Visit: Payer: Medicare HMO | Admitting: Orthopedic Surgery

## 2020-01-18 ENCOUNTER — Other Ambulatory Visit: Payer: Self-pay

## 2020-01-18 ENCOUNTER — Ambulatory Visit: Payer: Medicare HMO

## 2020-01-18 VITALS — BP 172/106 | HR 95

## 2020-01-18 DIAGNOSIS — M170 Bilateral primary osteoarthritis of knee: Secondary | ICD-10-CM

## 2020-01-18 NOTE — Progress Notes (Signed)
Chief Complaint  Patient presents with  . Knee Pain    Bilateral knee pain with stiffness.   Encounter Diagnosis  Name Primary?  . Bilateral primary osteoarthritis of knee Yes    Ms. Corsino is doing relatively well with her knees has some intermittent pain and discomfort but says her knees are the best part of her right now  Routine follow-up x-rays show arthritis of both joints moderate to severe especially in the patellofemoral joint  She will continue her current medications and we will do 2 knee injections   Procedure note for bilateral knee injections  Procedure note left knee injection verbal consent was obtained to inject left knee joint  Timeout was completed to confirm the site of injection  The medications used were 40 mg of Depo-Medrol and 1% lidocaine 3 cc  Anesthesia was provided by ethyl chloride and the skin was prepped with alcohol.  After cleaning the skin with alcohol a 20-gauge needle was used to inject the left knee joint. There were no complications. A sterile bandage was applied.   Procedure note right knee injection verbal consent was obtained to inject right knee joint  Timeout was completed to confirm the site of injection  The medications used were 40 mg of Depo-Medrol and 1% lidocaine 3 cc  Anesthesia was provided by ethyl chloride and the skin was prepped with alcohol.  After cleaning the skin with alcohol a 20-gauge needle was used to inject the right knee joint. There were no complications. A sterile bandage was applied.

## 2020-01-19 ENCOUNTER — Ambulatory Visit
Admission: RE | Admit: 2020-01-19 | Discharge: 2020-01-19 | Disposition: A | Discharge status: Home / Self Care | Payer: Medicare HMO | Source: Ambulatory Visit | Attending: Family Medicine | Admitting: Family Medicine

## 2020-01-19 DIAGNOSIS — R928 Other abnormal and inconclusive findings on diagnostic imaging of breast: Secondary | ICD-10-CM | POA: Diagnosis present

## 2020-01-19 HISTORY — PX: BREAST BIOPSY: SHX20

## 2020-01-20 ENCOUNTER — Ambulatory Visit: Payer: Medicare HMO | Admitting: Orthopedic Surgery

## 2020-01-20 LAB — SURGICAL PATHOLOGY

## 2020-02-09 ENCOUNTER — Other Ambulatory Visit: Payer: Self-pay | Admitting: Orthopedic Surgery

## 2020-02-09 DIAGNOSIS — G8929 Other chronic pain: Secondary | ICD-10-CM

## 2020-02-09 DIAGNOSIS — M545 Low back pain, unspecified: Secondary | ICD-10-CM

## 2020-02-09 DIAGNOSIS — M1711 Unilateral primary osteoarthritis, right knee: Secondary | ICD-10-CM

## 2020-02-09 MED ORDER — HYDROCODONE-ACETAMINOPHEN 5-325 MG PO TABS: 1.0000 | ORAL_TABLET | Freq: Three times a day (TID) | ORAL | 0 refills | Status: DC | PRN

## 2020-02-09 NOTE — Telephone Encounter (Signed)
Patient requests refill: HYDROcodone-acetaminophen (NORCO/VICODIN) 5-325 MG tablet 42 tablet  -[facility]

## 2020-03-21 ENCOUNTER — Other Ambulatory Visit: Payer: Self-pay | Admitting: Orthopedic Surgery

## 2020-03-21 DIAGNOSIS — M1711 Unilateral primary osteoarthritis, right knee: Secondary | ICD-10-CM

## 2020-03-21 DIAGNOSIS — M545 Low back pain, unspecified: Secondary | ICD-10-CM

## 2020-03-21 MED ORDER — HYDROCODONE-ACETAMINOPHEN 5-325 MG PO TABS: 1.0000 | ORAL_TABLET | Freq: Three times a day (TID) | ORAL | 0 refills | Status: DC | PRN

## 2020-03-21 NOTE — Telephone Encounter (Signed)
Patient called for refill (from calls 03/16/20, office closed 03/17/20 until 03/21/20):  HYDROcodone-acetaminophen (NORCO/VICODIN) 5-325 MG tablet 42 tablet  [facility]

## 2020-04-26 ENCOUNTER — Other Ambulatory Visit: Payer: Self-pay | Admitting: Radiology

## 2020-04-26 DIAGNOSIS — M1711 Unilateral primary osteoarthritis, right knee: Secondary | ICD-10-CM

## 2020-04-26 DIAGNOSIS — M545 Low back pain, unspecified: Secondary | ICD-10-CM

## 2020-04-26 DIAGNOSIS — G8929 Other chronic pain: Secondary | ICD-10-CM

## 2020-04-26 NOTE — Telephone Encounter (Signed)
Patient called left message on voice mail asking for refill hydrocodone.

## 2020-04-27 MED ORDER — HYDROCODONE-ACETAMINOPHEN 5-325 MG PO TABS: 1.0000 | ORAL_TABLET | Freq: Three times a day (TID) | ORAL | 0 refills | Status: DC | PRN

## 2020-06-28 ENCOUNTER — Other Ambulatory Visit: Payer: Self-pay | Admitting: Orthopedic Surgery

## 2020-06-28 DIAGNOSIS — M1711 Unilateral primary osteoarthritis, right knee: Secondary | ICD-10-CM

## 2020-06-28 DIAGNOSIS — M545 Low back pain, unspecified: Secondary | ICD-10-CM

## 2020-06-28 NOTE — Telephone Encounter (Signed)
Patient called request refill on pain medicine   HYDROcodone-acetaminophen (NORCO/VICODIN) 5-325 MG tablet  Pharmacy:  [facility]

## 2020-06-30 MED ORDER — HYDROCODONE-ACETAMINOPHEN 5-325 MG PO TABS: 1.0000 | ORAL_TABLET | Freq: Three times a day (TID) | ORAL | 0 refills | Status: DC | PRN

## 2020-08-17 ENCOUNTER — Other Ambulatory Visit: Payer: Self-pay | Admitting: Orthopedic Surgery

## 2020-08-17 DIAGNOSIS — G8929 Other chronic pain: Secondary | ICD-10-CM

## 2020-08-17 DIAGNOSIS — M1711 Unilateral primary osteoarthritis, right knee: Secondary | ICD-10-CM

## 2020-08-17 MED ORDER — HYDROCODONE-ACETAMINOPHEN 5-325 MG PO TABS: 1.0000 | ORAL_TABLET | Freq: Three times a day (TID) | ORAL | 0 refills | Status: DC | PRN

## 2020-08-17 NOTE — Telephone Encounter (Signed)
Patient called request refill on pain medicine   HYDROcodone-acetaminophen (NORCO/VICODIN) 5-325 MG tablet   Pharmacy: [facility]

## 2020-10-13 ENCOUNTER — Other Ambulatory Visit: Payer: Self-pay | Admitting: Orthopedic Surgery

## 2020-10-13 NOTE — Telephone Encounter (Signed)
Patient requests refill on Prednisone  Patient uses [facility]

## 2020-10-17 MED ORDER — PREDNISONE 10 MG (48) PO TBPK: ORAL_TABLET | ORAL | 0 refills | Status: DC

## 2020-10-17 NOTE — Telephone Encounter (Signed)
I was out of the office . I will call her to see what she needs I do not see Prednisone in her list.

## 2020-10-17 NOTE — Telephone Encounter (Signed)
She is asking for a taper, pended I told her if you send for her not to take Diclofenac with it she voiced understanding

## 2020-11-30 ENCOUNTER — Other Ambulatory Visit: Payer: Self-pay | Admitting: Orthopedic Surgery

## 2020-11-30 DIAGNOSIS — M545 Low back pain, unspecified: Secondary | ICD-10-CM

## 2020-11-30 DIAGNOSIS — M1711 Unilateral primary osteoarthritis, right knee: Secondary | ICD-10-CM

## 2020-11-30 MED ORDER — HYDROCODONE-ACETAMINOPHEN 5-325 MG PO TABS: 1.0000 | ORAL_TABLET | Freq: Three times a day (TID) | ORAL | 0 refills | Status: DC | PRN

## 2020-11-30 NOTE — Telephone Encounter (Signed)
  Patient called requests refill on pain medicine    HYDROcodone-acetaminophen (NORCO/VICODIN) 5-325 MG tablet     Pharmacy: [facility]

## 2020-12-11 NOTE — Progress Notes (Signed)
12/12/2020 10:32 AM   Shannon Gallegos 1950-09-13 [id number]  Referring provider: Alfonse Flavors, MD [address] [place],  [place] [address]  Chief Complaint  Patient presents with   Recurrent UTI    HPI: Shannon Gallegos is a 70 y.o. female referred for evaluation of recurrent UTI.  Four culture + UTIs between March-August 2022 E. coli and Klebsiella oxytoca x3 Symptoms are mild and include slight bladder pressure, urinary frequency, increased odor to urine and mild dysuria No fever/chills Denies gross hematuria Denies flank/abdominal pain Presently asymptomatic   PMH: Past Medical History:  Diagnosis Date   Arthritis    Borderline diabetes    GERD (gastroesophageal reflux disease)    Hypercholesterolemia    Hypertension    Vertigo     Surgical History: Past Surgical History:  Procedure Laterality Date   BREAST BIOPSY Left 2000   neg   BREAST BIOPSY Right 01/19/2020   [place] bx of LN, path pending   CATARACT EXTRACTION Bilateral    COLONOSCOPY  March 2014   Dr. Ellender Hose: sigmoid and descending colon diverticula, ADENOMATOUS polyp. Due for surveillance 2019   ESOPHAGOGASTRODUODENOSCOPY (EGD) WITH ESOPHAGEAL DILATION N/A 12/09/2012   Dr. Oneida Alar: small hiatal hernia, Schatzki's ring at GE junction s/p Savary dilation, benign sessile polyps, moderate gastritis   KNEE ARTHROSCOPY WITH MEDIAL MENISECTOMY Left 04/28/2015   Procedure: LEFT KNEE ARTHROSCOPY WITH PARTIAL MEDIAL MENISECTOMY;  Surgeon: Carole Civil, MD;  Location: [facility];  Service: Orthopedics;  Laterality: Left;   KNEE SURGERY Bilateral    arthroscopies   TUBAL LIGATION      Home Medications:  Allergies as of 12/12/2020   No Known Allergies      Medication List        Accurate as of December 12, 2020 10:32 AM. If you have any questions, ask your nurse or doctor.          atorvastatin 40 MG tablet Commonly known as: LIPITOR Take 40 mg by mouth daily.   diclofenac 75 MG EC  tablet Commonly known as: VOLTAREN Take 1 tablet (75 mg total) by mouth 2 (two) times daily.   esomeprazole 40 MG capsule Commonly known as: NEXIUM Take 40 mg by mouth daily at 12 noon.   fluticasone 50 MCG/ACT nasal spray Commonly known as: FLONASE Place 2 sprays into the nose daily.   furosemide 20 MG tablet Commonly known as: LASIX   gabapentin 300 MG capsule Commonly known as: NEURONTIN Take 300 mg by mouth 3 (three) times daily.   hydrochlorothiazide 25 MG tablet Commonly known as: HYDRODIURIL Take 25 mg by mouth daily.   HYDROcodone-acetaminophen 5-325 MG tablet Commonly known as: NORCO/VICODIN Take 1 tablet by mouth every 8 (eight) hours as needed for moderate pain.   meclizine 25 MG tablet Commonly known as: ANTIVERT Take 25 mg by mouth 3 (three) times daily as needed for dizziness.   omeprazole 40 MG capsule Commonly known as: PRILOSEC   potassium chloride SA 20 MEQ tablet Commonly known as: KLOR-CON Take 1 tablet (20 mEq total) by mouth daily.   predniSONE 10 MG (48) Tbpk tablet Commonly known as: STERAPRED UNI-PAK 48 TAB Take as directed on pack / 12 day taper   traZODone 50 MG tablet Commonly known as: DESYREL Take 50 mg by mouth at bedtime.   triamcinolone 0.025 % cream Commonly known as: KENALOG Apply 1 application topically 3 (three) times daily.   Vitamin D3 25 MCG (1000 UT) Caps Take by mouth.  Allergies: No Known Allergies  Family History: Family History  Problem Relation Age of Onset   Cirrhosis Father        alcoholic   Colon cancer Neg Hx    Breast cancer Neg Hx     Social History:  reports that she has never smoked. She has never used smokeless tobacco. She reports that she does not drink alcohol and does not use drugs.   Physical Exam: BP 134/81   Pulse 89   Ht '5\' 2"'$  (1.575 m)   Wt 200 lb (90.7 kg)   BMI 36.58 kg/m   Constitutional:  Alert and oriented, No acute distress. HEENT: [place] AT, moist mucus membranes.   Trachea midline, no masses. Cardiovascular: No clubbing, cyanosis, or edema. Respiratory: Normal respiratory effort, no increased work of breathing. Neurologic: Grossly intact, no focal deficits, moving all 4 extremities. Psychiatric: Normal mood and affect.  Laboratory Data:  Urinalysis 12/12/2020: Dipstick/microscopy negative  Assessment & Plan:    1.  Recurrent UTI UA today clear Bladder scan PVR 0 mL Possible etiologies of recurrent infection include periurethral tissue atrophy in postmenopausal woman, constipation, sexual activity, incomplete emptying, anatomic abnormalities, and even genetic predisposition.  Finally, we discussed the role of perineal hygiene, timed voiding, adequate hydration, topical vaginal estrogen, cranberry prophylaxis, D-mannose and low-dose antibiotic prophylaxis. 6 week follow-up for recheck; instructed to call earlier for recurrent UTI symptoms   Abbie Sons, MD  [facility] [address] [place], [place] [address] [phone]

## 2020-12-12 ENCOUNTER — Ambulatory Visit: Payer: Medicare HMO | Admitting: Urology

## 2020-12-12 ENCOUNTER — Other Ambulatory Visit: Payer: Self-pay

## 2020-12-12 ENCOUNTER — Encounter: Payer: Self-pay | Admitting: Urology

## 2020-12-12 VITALS — BP 134/81 | HR 89 | Ht 62.0 in | Wt 200.0 lb

## 2020-12-12 DIAGNOSIS — N390 Urinary tract infection, site not specified: Secondary | ICD-10-CM

## 2020-12-12 LAB — URINALYSIS, COMPLETE
Bilirubin, UA: NEGATIVE
Glucose, UA: NEGATIVE
Ketones, UA: NEGATIVE
Leukocytes,UA: NEGATIVE
Nitrite, UA: NEGATIVE
Protein,UA: NEGATIVE
Specific Gravity, UA: 1.02 (ref 1.005–1.030)
Urobilinogen, Ur: 0.2 mg/dL (ref 0.2–1.0)
pH, UA: 6 (ref 5.0–7.5)

## 2020-12-12 LAB — MICROSCOPIC EXAMINATION: Bacteria, UA: NONE SEEN

## 2020-12-12 LAB — BLADDER SCAN AMB NON-IMAGING: Scan Result: 0

## 2020-12-12 NOTE — Patient Instructions (Signed)
Take over the counter D-Mannose and cranberry tablets daily.

## 2020-12-19 ENCOUNTER — Other Ambulatory Visit: Payer: Self-pay | Admitting: Family Medicine

## 2020-12-19 DIAGNOSIS — Z13820 Encounter for screening for osteoporosis: Secondary | ICD-10-CM

## 2021-01-16 ENCOUNTER — Ambulatory Visit (INDEPENDENT_AMBULATORY_CARE_PROVIDER_SITE_OTHER): Payer: Medicare HMO | Admitting: Orthopedic Surgery

## 2021-01-16 ENCOUNTER — Encounter: Payer: Self-pay | Admitting: Orthopedic Surgery

## 2021-01-16 ENCOUNTER — Ambulatory Visit: Payer: Medicare HMO

## 2021-01-16 ENCOUNTER — Other Ambulatory Visit: Payer: Self-pay

## 2021-01-16 VITALS — BP 150/85 | HR 105 | Ht 62.0 in | Wt 200.0 lb

## 2021-01-16 DIAGNOSIS — M170 Bilateral primary osteoarthritis of knee: Secondary | ICD-10-CM

## 2021-01-16 DIAGNOSIS — G8929 Other chronic pain: Secondary | ICD-10-CM

## 2021-01-16 DIAGNOSIS — M1711 Unilateral primary osteoarthritis, right knee: Secondary | ICD-10-CM

## 2021-01-16 DIAGNOSIS — M545 Low back pain, unspecified: Secondary | ICD-10-CM

## 2021-01-16 MED ORDER — HYDROCODONE-ACETAMINOPHEN 5-325 MG PO TABS: 1.0000 | ORAL_TABLET | Freq: Three times a day (TID) | ORAL | 0 refills | Status: DC | PRN

## 2021-01-16 NOTE — Progress Notes (Signed)
EVALUATION AND MANAGEMENT   Type of appointment : Annual follow-up  PLAN: Continue the hydrocodone  We gave her 2 injections 1 in each knee with Depo-Medrol  Meds ordered this encounter  Medications   HYDROcodone-acetaminophen (NORCO/VICODIN) 5-325 MG tablet    Sig: Take 1 tablet by mouth every 8 (eight) hours as needed for moderate pain.    Dispense:  42 tablet    Refill:  0     Chief Complaint  Patient presents with   Post-op Follow-up    Bilateral knees     70 year old female bilateral knee pain chronic previous surgery elsewhere many years ago we have been maintaining her very well with hydrocodone which is still working using 5 mg every 8 hours she gets intermittent injections as needed she complains of some intermittent pain and discomfort now that the weather is changing     ROS  Back stable at this time Body mass index is 36.58 kg/m.  Physical Exam Constitutional:      General: She is not in acute distress.    Appearance: She is well-developed.     Comments: Well developed, well nourished Normal grooming and hygiene     Cardiovascular:     Comments: No peripheral edema Musculoskeletal:     Right knee: Crepitus present. No swelling, effusion, erythema or ecchymosis. Normal range of motion. No tenderness. No LCL laxity, MCL laxity, ACL laxity or PCL laxity. Normal alignment.     Left knee: Crepitus present. No swelling, effusion, erythema or ecchymosis. Normal range of motion. No tenderness. No LCL laxity, MCL laxity, ACL laxity or PCL laxity.Normal alignment.  Skin:    General: Skin is warm and dry.  Neurological:     Mental Status: She is alert and oriented to person, place, and time.     Sensory: No sensory deficit.     Coordination: Coordination normal.     Gait: Gait normal.     Deep Tendon Reflexes: Reflexes are normal and symmetric.  Psychiatric:        Mood and Affect: Mood normal.        Behavior: Behavior normal.        Thought Content:  Thought content normal.        Judgment: Judgment normal.     Comments: Affect normal     Past Medical History:  Diagnosis Date   Arthritis    Borderline diabetes    GERD (gastroesophageal reflux disease)    Hypercholesterolemia    Hypertension    Vertigo    Past Surgical History:  Procedure Laterality Date   BREAST BIOPSY Left 2000   neg   BREAST BIOPSY Right 01/19/2020   [place] bx of LN, path pending   CATARACT EXTRACTION Bilateral    COLONOSCOPY  March 2014   Dr. Ellender Hose: sigmoid and descending colon diverticula, ADENOMATOUS polyp. Due for surveillance 2019   ESOPHAGOGASTRODUODENOSCOPY (EGD) WITH ESOPHAGEAL DILATION N/A 12/09/2012   Dr. Oneida Alar: small hiatal hernia, Schatzki's ring at GE junction s/p Savary dilation, benign sessile polyps, moderate gastritis   KNEE ARTHROSCOPY WITH MEDIAL MENISECTOMY Left 04/28/2015   Procedure: LEFT KNEE ARTHROSCOPY WITH PARTIAL MEDIAL MENISECTOMY;  Surgeon: Carole Civil, MD;  Location: [facility];  Service: Orthopedics;  Laterality: Left;   KNEE SURGERY Bilateral    arthroscopies   TUBAL LIGATION     Social History   Tobacco Use   Smoking status: Never   Smokeless tobacco: Never  Substance Use Topics   Alcohol use: No   Drug  use: No     Assessment and Plan:  Encounter Diagnoses  Name Primary?   Bilateral primary osteoarthritis of knee Yes   Chronic bilateral low back pain without sciatica    Arthritis of knee, right     Inject both knees continue hydrocodone follow-up in a year

## 2021-01-25 ENCOUNTER — Ambulatory Visit: Payer: Medicare HMO | Admitting: Urology

## 2021-01-25 NOTE — Progress Notes (Deleted)
01/25/2021 7:50 AM   Shannon Gallegos [birth year] [id number]  Referring provider: Alfonse Flavors, MD [address] [place],  [place] [address]  No chief complaint on file.   HPI: 70 y.o. female presents for follow-up visit.  Initially seen 12/12/2020 for recurrent UTI Potential preventative measures were discussed and 6-week follow-up recommended Since her last visit   PMH: Past Medical History:  Diagnosis Date   Arthritis    Borderline diabetes    GERD (gastroesophageal reflux disease)    Hypercholesterolemia    Hypertension    Vertigo     Surgical History: Past Surgical History:  Procedure Laterality Date   BREAST BIOPSY Left 2000   neg   BREAST BIOPSY Right 01/19/2020   [place] bx of LN, path pending   CATARACT EXTRACTION Bilateral    COLONOSCOPY  March 2014   Dr. Ellender Hose: sigmoid and descending colon diverticula, ADENOMATOUS polyp. Due for surveillance 2019   ESOPHAGOGASTRODUODENOSCOPY (EGD) WITH ESOPHAGEAL DILATION N/A 12/09/2012   Dr. Oneida Alar: small hiatal hernia, Schatzki's ring at GE junction s/p Savary dilation, benign sessile polyps, moderate gastritis   KNEE ARTHROSCOPY WITH MEDIAL MENISECTOMY Left 04/28/2015   Procedure: LEFT KNEE ARTHROSCOPY WITH PARTIAL MEDIAL MENISECTOMY;  Surgeon: Carole Civil, MD;  Location: [facility];  Service: Orthopedics;  Laterality: Left;   KNEE SURGERY Bilateral    arthroscopies   TUBAL LIGATION      Home Medications:  Allergies as of 01/25/2021   No Known Allergies      Medication List        Accurate as of January 25, 2021  7:50 AM. If you have any questions, ask your nurse or doctor.          STOP taking these medications    triamcinolone 0.025 % cream Commonly known as: KENALOG       TAKE these medications    atorvastatin 40 MG tablet Commonly known as: LIPITOR Take 40 mg by mouth daily.   esomeprazole 40 MG capsule Commonly known as: NEXIUM Take 40 mg by mouth daily at 12 noon.    fluticasone 50 MCG/ACT nasal spray Commonly known as: FLONASE Place 2 sprays into the nose daily.   furosemide 20 MG tablet Commonly known as: LASIX   gabapentin 300 MG capsule Commonly known as: NEURONTIN Take 300 mg by mouth 3 (three) times daily.   hydrochlorothiazide 25 MG tablet Commonly known as: HYDRODIURIL Take 25 mg by mouth daily.   HYDROcodone-acetaminophen 5-325 MG tablet Commonly known as: NORCO/VICODIN Take 1 tablet by mouth every 8 (eight) hours as needed for moderate pain.   meclizine 25 MG tablet Commonly known as: ANTIVERT Take 25 mg by mouth 3 (three) times daily as needed for dizziness.   omeprazole 40 MG capsule Commonly known as: PRILOSEC   potassium chloride SA 20 MEQ tablet Commonly known as: KLOR-CON Take 1 tablet (20 mEq total) by mouth daily.   Vitamin D3 25 MCG (1000 UT) Caps Take by mouth.        Allergies: No Known Allergies  Family History: Family History  Problem Relation Age of Onset   Cirrhosis Father        alcoholic   Colon cancer Neg Hx    Breast cancer Neg Hx     Social History:  reports that she has never smoked. She has never used smokeless tobacco. She reports that she does not drink alcohol and does not use drugs.   Physical Exam: There were no vitals taken  for this visit.  Constitutional:  Alert and oriented, No acute distress. HEENT: [place] AT, moist mucus membranes.  Trachea midline, no masses. Cardiovascular: No clubbing, cyanosis, or edema. Respiratory: Normal respiratory effort, no increased work of breathing. GI: Abdomen is soft, nontender, nondistended, no abdominal masses GU: No CVA tenderness Skin: No rashes, bruises or suspicious lesions. Neurologic: Grossly intact, no focal deficits, moving all 4 extremities. Psychiatric: Normal mood and affect.  Laboratory Data: Lab Results  Component Value Date   WBC 5.7 04/26/2015   HGB 13.9 04/28/2015   HCT 41.0 04/28/2015   MCV 90.4 04/26/2015   PLT 189  04/26/2015    Lab Results  Component Value Date   CREATININE 0.82 04/26/2015    No results found for: PSA  No results found for: TESTOSTERONE  No results found for: HGBA1C  Urinalysis    Component Value Date/Time   APPEARANCEUR Clear 12/12/2020 1021   GLUCOSEU Negative 12/12/2020 1021   BILIRUBINUR Negative 12/12/2020 1021   PROTEINUR Negative 12/12/2020 1021   NITRITE Negative 12/12/2020 1021   LEUKOCYTESUR Negative 12/12/2020 1021    Lab Results  Component Value Date   LABMICR See below: 12/12/2020   WBCUA 0-5 12/12/2020   LABEPIT 0-10 12/12/2020   MUCUS Present (A) 12/12/2020   BACTERIA None seen 12/12/2020    Pertinent Imaging: *** No results found for this or any previous visit.  No results found for this or any previous visit.  No results found for this or any previous visit.  No results found for this or any previous visit.  No results found for this or any previous visit.  No results found for this or any previous visit.  No results found for this or any previous visit.  No results found for this or any previous visit.   Assessment & Plan:    There are no diagnoses linked to this encounter.  No follow-ups on file.  Abbie Sons, [place] [address] [place], [place] [address] [phone]

## 2021-01-26 ENCOUNTER — Encounter: Payer: Self-pay | Admitting: Urology

## 2021-01-31 ENCOUNTER — Other Ambulatory Visit: Payer: Self-pay

## 2021-01-31 ENCOUNTER — Ambulatory Visit
Admission: RE | Admit: 2021-01-31 | Discharge: 2021-01-31 | Disposition: A | Discharge status: Home / Self Care | Payer: Medicare HMO | Source: Ambulatory Visit | Attending: Family Medicine | Admitting: Family Medicine

## 2021-01-31 DIAGNOSIS — Z780 Asymptomatic menopausal state: Secondary | ICD-10-CM | POA: Diagnosis not present

## 2021-01-31 DIAGNOSIS — Z13820 Encounter for screening for osteoporosis: Secondary | ICD-10-CM | POA: Insufficient documentation

## 2021-01-31 DIAGNOSIS — M8588 Other specified disorders of bone density and structure, other site: Secondary | ICD-10-CM | POA: Insufficient documentation

## 2021-03-31 ENCOUNTER — Other Ambulatory Visit: Payer: Self-pay

## 2021-03-31 DIAGNOSIS — M545 Low back pain, unspecified: Secondary | ICD-10-CM

## 2021-03-31 DIAGNOSIS — M1711 Unilateral primary osteoarthritis, right knee: Secondary | ICD-10-CM

## 2021-03-31 DIAGNOSIS — G8929 Other chronic pain: Secondary | ICD-10-CM

## 2021-04-03 MED ORDER — HYDROCODONE-ACETAMINOPHEN 5-325 MG PO TABS: 1.0000 | ORAL_TABLET | Freq: Three times a day (TID) | ORAL | 0 refills | Status: DC | PRN

## 2021-06-12 ENCOUNTER — Other Ambulatory Visit: Payer: Self-pay

## 2021-06-12 DIAGNOSIS — M1711 Unilateral primary osteoarthritis, right knee: Secondary | ICD-10-CM

## 2021-06-12 DIAGNOSIS — M545 Low back pain, unspecified: Secondary | ICD-10-CM

## 2021-06-12 MED ORDER — HYDROCODONE-ACETAMINOPHEN 5-325 MG PO TABS: 1.0000 | ORAL_TABLET | Freq: Three times a day (TID) | ORAL | 0 refills | Status: DC | PRN

## 2021-06-12 NOTE — Telephone Encounter (Signed)
Request sent to provider.

## 2021-06-12 NOTE — Telephone Encounter (Signed)
Hydrocodone-Acetaminophen 5/325 MG   Qty 42 Tablets ? ?Take 1 tablet by mouth every 8(eight) hours as needed for moderate pain. ? ?PATIENT USES [facility] ?

## 2021-06-28 ENCOUNTER — Other Ambulatory Visit: Payer: Self-pay | Admitting: Orthopedic Surgery

## 2021-06-28 ENCOUNTER — Telehealth: Payer: Self-pay

## 2021-06-28 MED ORDER — PREDNISONE 10 MG (48) PO TBPK: ORAL_TABLET | Freq: Every day | ORAL | 0 refills | Status: DC

## 2021-06-28 NOTE — Telephone Encounter (Signed)
Patient is asking for a prescription for Prednisone 10 MG (48 Tabs) ? ?PATIENT USES [facility] ?

## 2021-06-28 NOTE — Progress Notes (Signed)
Meds ordered this encounter  Medications   predniSONE (STERAPRED UNI-PAK 48 TAB) 10 MG (48) TBPK tablet    Sig: Take by mouth daily. 10 mg 12 days as directed    Dispense:  48 tablet    Refill:  0     

## 2021-08-30 ENCOUNTER — Other Ambulatory Visit: Payer: Self-pay | Admitting: Orthopedic Surgery

## 2021-08-30 DIAGNOSIS — M1711 Unilateral primary osteoarthritis, right knee: Secondary | ICD-10-CM

## 2021-08-30 DIAGNOSIS — G8929 Other chronic pain: Secondary | ICD-10-CM

## 2021-08-30 MED ORDER — HYDROCODONE-ACETAMINOPHEN 5-325 MG PO TABS: 1.0000 | ORAL_TABLET | Freq: Three times a day (TID) | ORAL | 0 refills | Status: DC | PRN

## 2021-08-30 NOTE — Telephone Encounter (Signed)
Patient called requesting a refill on her pain medicine     HYDROcodone-acetaminophen (NORCO/VICODIN) 5-325 MG tablet   [facility] in [place]

## 2021-10-03 ENCOUNTER — Other Ambulatory Visit: Payer: Self-pay | Admitting: Family Medicine

## 2021-10-03 DIAGNOSIS — Z1231 Encounter for screening mammogram for malignant neoplasm of breast: Secondary | ICD-10-CM

## 2021-10-25 ENCOUNTER — Ambulatory Visit
Admission: RE | Admit: 2021-10-25 | Discharge: 2021-10-25 | Disposition: A | Discharge status: Home / Self Care | Payer: Medicare HMO | Source: Ambulatory Visit | Attending: Family Medicine | Admitting: Family Medicine

## 2021-10-25 DIAGNOSIS — Z1231 Encounter for screening mammogram for malignant neoplasm of breast: Secondary | ICD-10-CM | POA: Insufficient documentation

## 2022-01-02 ENCOUNTER — Other Ambulatory Visit: Payer: Self-pay | Admitting: Orthopedic Surgery

## 2022-01-02 DIAGNOSIS — M1711 Unilateral primary osteoarthritis, right knee: Secondary | ICD-10-CM

## 2022-01-02 DIAGNOSIS — M545 Low back pain, unspecified: Secondary | ICD-10-CM

## 2022-01-02 NOTE — Telephone Encounter (Signed)
Patient requests refill  HYDROcodone-acetaminophen (NORCO/VICODIN) 5-325 MG tablet 42 tablet  [facility]  - aware of her next scheduled appointment

## 2022-01-03 MED ORDER — HYDROCODONE-ACETAMINOPHEN 5-325 MG PO TABS: 1.0000 | ORAL_TABLET | Freq: Three times a day (TID) | ORAL | 0 refills | Status: DC | PRN

## 2022-01-18 ENCOUNTER — Ambulatory Visit: Payer: Medicare HMO | Admitting: Orthopedic Surgery

## 2022-01-29 ENCOUNTER — Ambulatory Visit (INDEPENDENT_AMBULATORY_CARE_PROVIDER_SITE_OTHER): Payer: Medicare HMO | Admitting: Orthopedic Surgery

## 2022-01-29 ENCOUNTER — Ambulatory Visit (INDEPENDENT_AMBULATORY_CARE_PROVIDER_SITE_OTHER): Payer: Medicare HMO

## 2022-01-29 DIAGNOSIS — M170 Bilateral primary osteoarthritis of knee: Secondary | ICD-10-CM | POA: Diagnosis not present

## 2022-01-29 MED ORDER — PREDNISONE 10 MG (48) PO TBPK
ORAL_TABLET | Freq: Every day | ORAL | 5 refills | Status: DC
Start: 2022-01-29 — End: 2023-02-25

## 2022-01-29 NOTE — Progress Notes (Unsigned)
Office Visit Note   Patient: Shannon Gallegos           Date of Birth: Jul 30, 1950           MRN: [id number] Visit Date: 01/29/2022              Requested by: Danna Hefty, DO [address] [place],  [place] [address] PCP: Danna Hefty, DO   Assessment & Plan: Visit Diagnoses:  1. Bilateral primary osteoarthritis of knee    Chronic conditions stable at this time bilateral primary osteoarthritis of both knees   Plan: Bilateral knee injections  Patient will see [place] in a year she also wanted a dose of prednisone to help with her chronic joint pain    Follow-Up Instructions: No follow-ups on file.   Orders:  Orders Placed This Encounter  Procedures   DG Knee AP/LAT W/Sunrise Right   DG Knee AP/LAT W/Sunrise Left   Meds ordered this encounter  Medications   predniSONE (STERAPRED UNI-PAK 48 TAB) 10 MG (48) TBPK tablet    Sig: Take by mouth daily. 10 mg 12 days as directed    Dispense:  48 tablet    Refill:  5   Both knees are imaged today  3 views of each knee please see dictated detailed report  Both knees show grade 4 arthritis with mild alignment abnormalities primarily varus   Procedures: Large Joint Inj: bilateral knee on 01/30/2022 7:45 AM Details: 1.5 in anterolateral approach  Arthrogram: No  Medications (Right): 5 mL lidocaine 1 %; 40 mg methylPREDNISolone acetate 40 MG/ML Medications (Left): 5 mL lidocaine 1 %; 40 mg methylPREDNISolone acetate 40 MG/ML Procedure, treatment alternatives, risks and benefits explained, specific risks discussed. Consent was given by the patient. Immediately prior to procedure a time out was called to verify the correct patient, procedure, equipment, support staff and site/side marked as required. Patient was prepped and draped in the usual sterile fashion.       Clinical Data: No additional findings.   Subjective: Chief Complaint  Patient presents with   Left Knee - Pain    1 year follow up , request  injection   Right Knee - Pain    One year follow up, request injection   Medication Refill    prednisone    71 year old female with bilateral knee pain and chronic osteoarthritis managed well with chronic opioid therapy and intermittent prednisone  She is coming in today for yearly follow-up and x-rays  She says she is in pretty good condition she has good pain control and good overall function in terms of activities of daily living      Review of Systems   Objective: Vital Signs: There were no vitals taken for this visit.  Physical Exam Vitals and nursing note reviewed.  Constitutional:      General: She is not in acute distress.    Appearance: Normal appearance. She is not ill-appearing, toxic-appearing or diaphoretic.  HENT:     Nose: No congestion or rhinorrhea.  Pulmonary:     Breath sounds: No wheezing.  Musculoskeletal:     Right knee: No effusion.     Left knee: No effusion.  Skin:    General: Skin is warm and dry.     Capillary Refill: Capillary refill takes less than 2 seconds.     Coloration: Skin is not jaundiced.     Findings: No erythema.  Neurological:     General: No focal deficit present.  Mental Status: She is alert and oriented to person, place, and time.  Psychiatric:        Mood and Affect: Mood normal.        Behavior: Behavior normal.        Thought Content: Thought content normal.        Judgment: Judgment normal.     Right Knee Exam   Muscle Strength  The patient has normal right knee strength.  Tenderness  The patient is experiencing tenderness in the medial joint line.  Range of Motion  Flexion:  120   Other  Erythema: absent Sensation: normal Pulse: present Swelling: none Effusion: no effusion present   Left Knee Exam   Muscle Strength  The patient has normal left knee strength.  Tenderness  The patient is experiencing tenderness in the medial joint line.  Range of Motion  Flexion:  120   Other  Erythema:  absent Sensation: normal Pulse: present Swelling: none Effusion: no effusion present      Specialty Comments:  No specialty comments available.  Imaging: No results found.   PMFS History: Patient Active Problem List   Diagnosis Date Noted   Osteoarthritis of multiple joints 09/02/2019   Chronic right-sided low back pain without sciatica 08/28/2019   Anti-RNP antibodies present 04/02/2019   Obesity (BMI 35.0-39.9 without comorbidity) 12/03/2018   Lymphedema of both lower extremities 11/18/2018   Myalgia 11/18/2018   Other specified abnormal immunological findings in serum 11/18/2018   Primary osteoarthritis of both knees 11/18/2018   Prurigo nodularis 11/18/2018   Class 2 obesity due to excess calories without serious comorbidity in adult 11/18/2018   ANA positive 11/18/2018   Acute medial meniscus tear of left knee    Arthritis of knee    Plantar fasciitis of left foot 06/15/2014   Dryness, eye 08/04/2013   Lens replaced by other means 07/07/2013   Status post cataract extraction 06/25/2013   Right flank discomfort 06/04/2013   Abdominal pain 06/04/2013   Knee pain 05/03/2013   Dyspepsia 04/23/2013   Dry mouth 04/23/2013   Disturbance of salivary secretion 04/23/2013   Dyspepsia and disorder of function of stomach 04/23/2013   GERD (gastroesophageal reflux disease) 11/20/2012   Dysphagia, unspecified(787.20) 11/20/2012   Adenomatous polyp 11/20/2012   Benign neoplasm 11/20/2012   Dysphagia 11/20/2012   Past Medical History:  Diagnosis Date   Arthritis    Borderline diabetes    GERD (gastroesophageal reflux disease)    Hypercholesterolemia    Hypertension    Vertigo     Family History  Problem Relation Age of Onset   Cirrhosis Father        alcoholic   Colon cancer Neg Hx    Breast cancer Neg Hx     Past Surgical History:  Procedure Laterality Date   BREAST BIOPSY Left 2000   neg   BREAST BIOPSY Right 01/19/2020   [place] bx of LN, neg   CATARACT  EXTRACTION Bilateral    COLONOSCOPY  05/2012   Dr. Ellender Hose: sigmoid and descending colon diverticula, ADENOMATOUS polyp. Due for surveillance 2019   ESOPHAGOGASTRODUODENOSCOPY (EGD) WITH ESOPHAGEAL DILATION N/A 12/09/2012   Dr. Oneida Alar: small hiatal hernia, Schatzki's ring at GE junction s/p Savary dilation, benign sessile polyps, moderate gastritis   KNEE ARTHROSCOPY WITH MEDIAL MENISECTOMY Left 04/28/2015   Procedure: LEFT KNEE ARTHROSCOPY WITH PARTIAL MEDIAL MENISECTOMY;  Surgeon: Carole Civil, MD;  Location: [facility];  Service: Orthopedics;  Laterality: Left;   KNEE SURGERY Bilateral  arthroscopies   TUBAL LIGATION     Social History   Occupational History   Occupation: retired  Tobacco Use   Smoking status: Never   Smokeless tobacco: Never  Substance and Sexual Activity   Alcohol use: No   Drug use: No   Sexual activity: Yes    Birth control/protection: Post-menopausal

## 2022-01-30 DIAGNOSIS — M170 Bilateral primary osteoarthritis of knee: Secondary | ICD-10-CM | POA: Diagnosis not present

## 2022-01-30 MED ORDER — LIDOCAINE HCL 1 % IJ SOLN
5.0000 mL | INTRAMUSCULAR | Status: AC | PRN
  Administered 2022-01-30: 5 mL

## 2022-01-30 MED ORDER — METHYLPREDNISOLONE ACETATE 40 MG/ML IJ SUSP
40.0000 mg | INTRAMUSCULAR | Status: AC | PRN
  Administered 2022-01-30: 40 mg via INTRA_ARTICULAR

## 2022-02-04 NOTE — H&P (Signed)
Pre-Procedure H&P   Patient ID: Shannon Gallegos is a 71 y.o. female.  Gastroenterology Provider: Annamaria Helling, DO  Referring Provider: Dr. Tarry Kos PCP: Ivan Anchors, FNP  Date: 02/05/2022  HPI Shannon Gallegos is a 71 y.o. female who presents today for Colonoscopy for Surveillance-personal history of colon polyps .  Every other day bowel movement without melena or hematochezia  Patient last underwent colonoscopy in June 2019 at [facility] with Dr. Ellender Hose demonstrated left-sided diverticulosis-otherwise negative.  March 2014 colonoscopy 1 adenomatous polyp in the ascending colon  No family history colon cancer or colon polyps.   Past Medical History:  Diagnosis Date   Arthritis    Borderline diabetes    GERD (gastroesophageal reflux disease)    Hypercholesterolemia    Hypertension    Vertigo     Past Surgical History:  Procedure Laterality Date   BREAST BIOPSY Left 2000   neg   BREAST BIOPSY Right 01/19/2020   [place] bx of LN, neg   CATARACT EXTRACTION Bilateral    COLONOSCOPY  05/2012   Dr. Ellender Hose: sigmoid and descending colon diverticula, ADENOMATOUS polyp. Due for surveillance 2019   ESOPHAGOGASTRODUODENOSCOPY (EGD) WITH ESOPHAGEAL DILATION N/A 12/09/2012   Dr. Oneida Alar: small hiatal hernia, Schatzki's ring at GE junction s/p Savary dilation, benign sessile polyps, moderate gastritis   KNEE ARTHROSCOPY WITH MEDIAL MENISECTOMY Left 04/28/2015   Procedure: LEFT KNEE ARTHROSCOPY WITH PARTIAL MEDIAL MENISECTOMY;  Surgeon: Carole Civil, MD;  Location: [facility];  Service: Orthopedics;  Laterality: Left;   KNEE SURGERY Bilateral    arthroscopies   TUBAL LIGATION      Family History No h/o GI disease or malignancy  Review of Systems  Constitutional:  Negative for activity change, appetite change, chills, diaphoresis, fatigue, fever and unexpected weight change.  HENT:  Negative for trouble swallowing and voice change.   Respiratory:  Negative for shortness of  breath and wheezing.   Cardiovascular:  Negative for chest pain, palpitations and leg swelling.  Gastrointestinal:  Negative for abdominal distention, abdominal pain, anal bleeding, blood in stool, constipation, diarrhea, nausea, rectal pain and vomiting.  Musculoskeletal:  Negative for arthralgias and myalgias.  Skin:  Negative for color change and pallor.  Neurological:  Negative for dizziness, syncope and weakness.  Psychiatric/Behavioral:  Negative for confusion.   All other systems reviewed and are negative.    Medications No current facility-administered medications on file prior to encounter.   Current Outpatient Medications on File Prior to Encounter  Medication Sig Dispense Refill   atorvastatin (LIPITOR) 40 MG tablet Take 40 mg by mouth daily.     furosemide (LASIX) 20 MG tablet   0   hydrochlorothiazide (HYDRODIURIL) 25 MG tablet Take 25 mg by mouth daily.     Cholecalciferol (VITAMIN D3) 25 MCG (1000 UT) CAPS Take by mouth.     esomeprazole (NEXIUM) 40 MG capsule Take 40 mg by mouth daily at 12 noon.     fluticasone (FLONASE) 50 MCG/ACT nasal spray Place 2 sprays into the nose daily.     gabapentin (NEURONTIN) 300 MG capsule Take 300 mg by mouth 3 (three) times daily.     meclizine (ANTIVERT) 25 MG tablet Take 25 mg by mouth 3 (three) times daily as needed for dizziness.      omeprazole (PRILOSEC) 40 MG capsule   1   potassium chloride SA (K-DUR,KLOR-CON) 20 MEQ tablet Take 1 tablet (20 mEq total) by mouth daily. 30 tablet 5    Pertinent medications  related to GI and procedure were reviewed by me with the patient prior to the procedure   Current Facility-Administered Medications:    0.9 %  sodium chloride infusion, , Intravenous, Continuous, Annamaria Helling, DO      No Known Allergies Allergies were reviewed by me prior to the procedure  Objective   Body mass index is 35.3 kg/m. Vitals:   02/05/22 0854  BP: (!) 114/91  Pulse: 90  Resp: 18  Temp: (!)  96.5 F (35.8 C)  TempSrc: Temporal  SpO2: 100%  Weight: 87.5 kg  Height: '5\' 2"'$  (1.575 m)     Physical Exam Vitals and nursing note reviewed.  Constitutional:      General: She is not in acute distress.    Appearance: Normal appearance. She is obese. She is not ill-appearing, toxic-appearing or diaphoretic.  HENT:     Head: Normocephalic and atraumatic.     Nose: Nose normal.     Mouth/Throat:     Mouth: Mucous membranes are moist.     Pharynx: Oropharynx is clear.  Eyes:     General: No scleral icterus.    Extraocular Movements: Extraocular movements intact.  Cardiovascular:     Rate and Rhythm: Normal rate and regular rhythm.     Heart sounds: Normal heart sounds. No murmur heard.    No friction rub. No gallop.  Pulmonary:     Effort: Pulmonary effort is normal. No respiratory distress.     Breath sounds: Normal breath sounds. No wheezing, rhonchi or rales.  Abdominal:     General: Bowel sounds are normal. There is no distension.     Palpations: Abdomen is soft.     Tenderness: There is no abdominal tenderness. There is no guarding or rebound.  Musculoskeletal:     Cervical back: Neck supple.     Right lower leg: No edema.     Left lower leg: No edema.  Skin:    General: Skin is warm and dry.     Coloration: Skin is not jaundiced or pale.  Neurological:     General: No focal deficit present.     Mental Status: She is alert and oriented to person, place, and time. Mental status is at baseline.  Psychiatric:        Mood and Affect: Mood normal.        Behavior: Behavior normal.        Thought Content: Thought content normal.        Judgment: Judgment normal.      Assessment:  Shannon Gallegos is a 71 y.o. female  who presents today for Colonoscopy for Surveillance-personal history of colon polyps .  Plan:  Colonoscopy with possible intervention today  Colonoscopy with possible biopsy, control of bleeding, polypectomy, and interventions as necessary has  been discussed with the patient/patient representative. Informed consent was obtained from the patient/patient representative after explaining the indication, nature, and risks of the procedure including but not limited to death, bleeding, perforation, missed neoplasm/lesions, cardiorespiratory compromise, and reaction to medications. Opportunity for questions was given and appropriate answers were provided. Patient/patient representative has verbalized understanding is amenable to undergoing the procedure.   Annamaria Helling, DO  [facility]  Portions of the record may have been created with voice recognition software. Occasional wrong-word or 'sound-a-like' substitutions may have occurred due to the inherent limitations of voice recognition software.  Read the chart carefully and recognize, using context, where substitutions may have occurred.

## 2022-02-05 ENCOUNTER — Encounter: Payer: Self-pay | Admitting: Gastroenterology

## 2022-02-05 ENCOUNTER — Ambulatory Visit: Payer: Medicare HMO | Admitting: Certified Registered Nurse Anesthetist

## 2022-02-05 ENCOUNTER — Encounter
Admission: RE | Disposition: A | Discharge status: Home / Self Care | Payer: Self-pay | Source: Ambulatory Visit | Attending: Gastroenterology

## 2022-02-05 ENCOUNTER — Ambulatory Visit
Admission: RE | Admit: 2022-02-05 | Discharge: 2022-02-05 | Disposition: A | Discharge status: Home / Self Care | Payer: Medicare HMO | Source: Ambulatory Visit | Attending: Gastroenterology | Admitting: Gastroenterology

## 2022-02-05 DIAGNOSIS — D123 Benign neoplasm of transverse colon: Secondary | ICD-10-CM | POA: Diagnosis not present

## 2022-02-05 DIAGNOSIS — Z86010 Personal history of colonic polyps: Secondary | ICD-10-CM | POA: Diagnosis not present

## 2022-02-05 DIAGNOSIS — K5730 Diverticulosis of large intestine without perforation or abscess without bleeding: Secondary | ICD-10-CM | POA: Diagnosis not present

## 2022-02-05 DIAGNOSIS — D120 Benign neoplasm of cecum: Secondary | ICD-10-CM | POA: Insufficient documentation

## 2022-02-05 DIAGNOSIS — K219 Gastro-esophageal reflux disease without esophagitis: Secondary | ICD-10-CM | POA: Diagnosis not present

## 2022-02-05 DIAGNOSIS — K640 First degree hemorrhoids: Secondary | ICD-10-CM | POA: Insufficient documentation

## 2022-02-05 DIAGNOSIS — Z09 Encounter for follow-up examination after completed treatment for conditions other than malignant neoplasm: Secondary | ICD-10-CM | POA: Diagnosis present

## 2022-02-05 DIAGNOSIS — E7800 Pure hypercholesterolemia, unspecified: Secondary | ICD-10-CM | POA: Insufficient documentation

## 2022-02-05 DIAGNOSIS — Z79899 Other long term (current) drug therapy: Secondary | ICD-10-CM | POA: Diagnosis not present

## 2022-02-05 DIAGNOSIS — I10 Essential (primary) hypertension: Secondary | ICD-10-CM | POA: Insufficient documentation

## 2022-02-05 DIAGNOSIS — R42 Dizziness and giddiness: Secondary | ICD-10-CM | POA: Diagnosis not present

## 2022-02-05 HISTORY — PX: COLONOSCOPY WITH PROPOFOL: SHX5780

## 2022-02-05 SURGERY — COLONOSCOPY WITH PROPOFOL
Anesthesia: General

## 2022-02-05 MED ORDER — PHENYLEPHRINE HCL (PRESSORS) 10 MG/ML IV SOLN
INTRAVENOUS | Status: DC | PRN
  Administered 2022-02-05: 40 ug via INTRAVENOUS
  Administered 2022-02-05: 80 ug via INTRAVENOUS

## 2022-02-05 MED ORDER — SODIUM CHLORIDE 0.9 % IV SOLN: INTRAVENOUS | Status: DC

## 2022-02-05 MED ORDER — PROPOFOL 10 MG/ML IV BOLUS
INTRAVENOUS | Status: DC | PRN
  Administered 2022-02-05: 80 mg via INTRAVENOUS

## 2022-02-05 MED ORDER — PROPOFOL 500 MG/50ML IV EMUL
INTRAVENOUS | Status: DC | PRN
  Administered 2022-02-05: 170 ug/kg/min via INTRAVENOUS

## 2022-02-05 NOTE — Anesthesia Procedure Notes (Signed)
Date/Time: 02/05/2022 9:18 AM  Performed by: Demetrius Charity, CRNAPre-anesthesia Checklist: Patient identified, Emergency Drugs available, Suction available, Patient being monitored and Timeout performed Patient Re-evaluated:Patient Re-evaluated prior to induction Oxygen Delivery Method: Nasal cannula Induction Type: IV induction Placement Confirmation: CO2 detector and positive ETCO2

## 2022-02-05 NOTE — Transfer of Care (Signed)
Immediate Anesthesia Transfer of Care Note  Patient: Shannon Gallegos  Procedure(s) Performed: COLONOSCOPY WITH PROPOFOL  Patient Location: PACU  Anesthesia Type:General  Level of Consciousness: drowsy  Airway & Oxygen Therapy: Patient Spontanous Breathing  Post-op Assessment: Report given to RN and Post -op Vital signs reviewed and stable  Post vital signs: Reviewed and stable  Last Vitals:  Vitals Value Taken Time  BP    Temp    Pulse    Resp    SpO2      Last Pain:  Vitals:   02/05/22 0854  TempSrc: Temporal  PainSc: 0-No pain         Complications: No notable events documented.

## 2022-02-05 NOTE — Interval H&P Note (Signed)
History and Physical Interval Note: Preprocedure H&P from 02/05/22  was reviewed and there was no interval change after seeing and examining the patient.  Written consent was obtained from the patient after discussion of risks, benefits, and alternatives. Patient has consented to proceed with Colonoscopy with possible intervention   02/05/2022 9:09 AM  [name]  has presented today for surgery, with the diagnosis of personal history adenomatous polyp.  The various methods of treatment have been discussed with the patient and family. After consideration of risks, benefits and other options for treatment, the patient has consented to  Procedure(s): COLONOSCOPY WITH PROPOFOL (N/A) as a surgical intervention.  The patient's history has been reviewed, patient examined, no change in status, stable for surgery.  I have reviewed the patient's chart and labs.  Questions were answered to the patient's satisfaction.     Annamaria Helling

## 2022-02-05 NOTE — Anesthesia Preprocedure Evaluation (Signed)
Anesthesia Evaluation  Patient identified by MRN, date of birth, ID band Patient awake    Reviewed: Allergy & Precautions, H&P , NPO status , Patient's Chart, lab work & pertinent test results, reviewed documented beta blocker date and time   Airway Mallampati: II   Neck ROM: full    Dental  (+) Poor Dentition   Pulmonary neg pulmonary ROS   Pulmonary exam normal        Cardiovascular Exercise Tolerance: Good hypertension, On Medications negative cardio ROS Normal cardiovascular exam Rhythm:regular Rate:Normal     Neuro/Psych negative neurological ROS  negative psych ROS   GI/Hepatic Neg liver ROS,GERD  Medicated,,  Endo/Other  negative endocrine ROS    Renal/GU negative Renal ROS  negative genitourinary   Musculoskeletal   Abdominal   Peds  Hematology negative hematology ROS (+)   Anesthesia Other Findings Past Medical History: No date: Arthritis No date: Borderline diabetes No date: GERD (gastroesophageal reflux disease) No date: Hypercholesterolemia No date: Hypertension No date: Vertigo Past Surgical History: 2000: BREAST BIOPSY; Left     Comment:  neg 01/19/2020: BREAST BIOPSY; Right     Comment:  [place] bx of LN, neg No date: CATARACT EXTRACTION; Bilateral 05/2012: COLONOSCOPY     Comment:  Dr. Ellender Hose: sigmoid and descending colon diverticula,               ADENOMATOUS polyp. Due for surveillance 2019 12/09/2012: ESOPHAGOGASTRODUODENOSCOPY (EGD) WITH ESOPHAGEAL  DILATION; N/A     Comment:  Dr. Oneida Alar: small hiatal hernia, Schatzki's ring at GE               junction s/p Savary dilation, benign sessile polyps,               moderate gastritis 04/28/2015: KNEE ARTHROSCOPY WITH MEDIAL MENISECTOMY; Left     Comment:  Procedure: LEFT KNEE ARTHROSCOPY WITH PARTIAL MEDIAL               MENISECTOMY;  Surgeon: Carole Civil, MD;  Location:              [facility];  Service: Orthopedics;  Laterality:  Left; No date: KNEE SURGERY; Bilateral     Comment:  arthroscopies No date: TUBAL LIGATION   Reproductive/Obstetrics negative OB ROS                             Anesthesia Physical Anesthesia Plan  ASA: 3  Anesthesia Plan: General   Post-op Pain Management:    Induction:   PONV Risk Score and Plan:   Airway Management Planned:   Additional Equipment:   Intra-op Plan:   Post-operative Plan:   Informed Consent: I have reviewed the patients History and Physical, chart, labs and discussed the procedure including the risks, benefits and alternatives for the proposed anesthesia with the patient or authorized representative who has indicated his/her understanding and acceptance.     Dental Advisory Given  Plan Discussed with: CRNA  Anesthesia Plan Comments:        Anesthesia Quick Evaluation

## 2022-02-05 NOTE — Op Note (Signed)
[facility] Patient Name: Shannon Gallegos Procedure Date: 02/05/2022 9:10 AM MRN: [id number] Account #: [id number] Date of Birth: 1951-02-03 Admit Type: Outpatient Age: 71 Room: [facility] ENDO ROOM 2 Gender: Female Note Status: Finalized Instrument Name: Colonoscope [id number] Procedure:             Colonoscopy Indications:           High risk colon cancer surveillance: Personal history                         of colonic polyps Providers:             Annamaria Helling DO, DO Referring MD:          No Local Md, MD (Referring MD) Medicines:             Monitored Anesthesia Care Complications:         No immediate complications. Estimated blood loss:                         Minimal. Procedure:             Pre-Anesthesia Assessment:                        - Prior to the procedure, a History and Physical was                         performed, and patient medications and allergies were                         reviewed. The patient is competent. The risks and                         benefits of the procedure and the sedation options and                         risks were discussed with the patient. All questions                         were answered and informed consent was obtained.                         Patient identification and proposed procedure were                         verified by the physician, the nurse, the anesthetist                         and the technician in the endoscopy suite. Mental                         Status Examination: alert and oriented. Airway                         Examination: normal oropharyngeal airway and neck                         mobility. Respiratory Examination: clear to  auscultation. CV Examination: RRR, no murmurs, no S3                         or S4. Prophylactic Antibiotics: The patient does not                         require prophylactic antibiotics. Prior                          Anticoagulants: The patient has taken no anticoagulant                         or antiplatelet agents. ASA Grade Assessment: III - A                         patient with severe systemic disease. After reviewing                         the risks and benefits, the patient was deemed in                         satisfactory condition to undergo the procedure. The                         anesthesia plan was to use monitored anesthesia care                         (MAC). Immediately prior to administration of                         medications, the patient was re-assessed for adequacy                         to receive sedatives. The heart rate, respiratory                         rate, oxygen saturations, blood pressure, adequacy of                         pulmonary ventilation, and response to care were                         monitored throughout the procedure. The physical                         status of the patient was re-assessed after the                         procedure.                        After obtaining informed consent, the colonoscope was                         passed under direct vision. Throughout the procedure,                         the patient's blood pressure, pulse, and oxygen  saturations were monitored continuously. The                         Colonoscope was introduced through the anus and                         advanced to the the cecum, identified by appendiceal                         orifice and ileocecal valve. The colonoscopy was                         performed without difficulty. The patient tolerated                         the procedure well. The quality of the bowel                         preparation was evaluated using the BBPS [facility] Bowel                         Preparation Scale) with scores of: Right Colon = 2                         (minor amount of residual staining, small fragments of                         stool  and/or opaque liquid, but mucosa seen well),                         Transverse Colon = 3 (entire mucosa seen well with no                         residual staining, small fragments of stool or opaque                         liquid) and Left Colon = 2 (minor amount of residual                         staining, small fragments of stool and/or opaque                         liquid, but mucosa seen well). The total BBPS score                         equals 7. The quality of the bowel preparation was                         good. The ileocecal valve, appendiceal orifice, and                         rectum were photographed. Findings:      The perianal and digital rectal examinations were normal. Pertinent       negatives include normal sphincter tone.      Multiple small-mouthed diverticula were found in the left colon.      Non-bleeding internal hemorrhoids were found during retroflexion. The  hemorrhoids were Grade I (internal hemorrhoids that do not prolapse).       Estimated blood loss: none.      A 1 to 2 mm polyp was found in the cecum. The polyp was sessile. The       polyp was removed with a jumbo cold forceps. Resection and retrieval       were complete. Estimated blood loss was minimal.      A 6 to 8 mm polyp was found in the transverse colon. The polyp was       sessile. The polyp was removed with a cold snare. Resection and       retrieval were complete. Estimated blood loss was minimal.      The exam was otherwise without abnormality on direct and retroflexion       views. Impression:            - Diverticulosis in the left colon.                        - Non-bleeding internal hemorrhoids.                        - One 1 to 2 mm polyp in the cecum, removed with a                         jumbo cold forceps. Resected and retrieved.                        - One 6 to 8 mm polyp in the transverse colon, removed                         with a cold snare. Resected and retrieved.                         - The examination was otherwise normal on direct and                         retroflexion views. Recommendation:        - Patient has a contact number available for                         emergencies. The signs and symptoms of potential                         delayed complications were discussed with the patient.                         Return to normal activities tomorrow. Written                         discharge instructions were provided to the patient.                        - Discharge patient to home.                        - Resume previous diet.                        - Continue present  medications.                        - Await pathology results.                        - No aspirin, ibuprofen, naproxen, or other                         non-steroidal anti-inflammatory drugs for 5 days after                         polyp removal.                        - Repeat colonoscopy for surveillance based on                         pathology results.                        - Return to referring physician as previously                         scheduled.                        - The findings and recommendations were discussed with                         the patient. Procedure Code(s):     --- Professional ---                        [phone], Colonoscopy, flexible; with removal of                         tumor(s), polyp(s), or other lesion(s) by snare                         technique                        45380, 43, Colonoscopy, flexible; with biopsy, single                         or multiple Diagnosis Code(s):     --- Professional ---                        Z86.010, Personal history of colonic polyps                        K64.0, First degree hemorrhoids                        D12.0, Benign neoplasm of cecum                        D12.3, Benign neoplasm of transverse colon (hepatic                         flexure or splenic flexure)  K57.30,  Diverticulosis of large intestine without                         perforation or abscess without bleeding CPT copyright 2022 American Medical Association. All rights reserved. The codes documented in this report are preliminary and upon coder review may  be revised to meet current compliance requirements. Attending Participation:      I personally performed the entire procedure. Volney American, DO Annamaria Helling DO, DO 02/05/2022 9:57:52 AM This report has been signed electronically. Number of Addenda: 0 Note Initiated On: 02/05/2022 9:10 AM Scope Withdrawal Time: 0 hours 21 minutes 26 seconds  Total Procedure Duration: 0 hours 29 minutes 24 seconds  Estimated Blood Loss:  Estimated blood loss was minimal.      [facility]

## 2022-02-06 ENCOUNTER — Encounter: Payer: Self-pay | Admitting: Gastroenterology

## 2022-02-06 LAB — SURGICAL PATHOLOGY

## 2022-02-06 NOTE — Anesthesia Postprocedure Evaluation (Signed)
Anesthesia Post Note  Patient: [name]  Procedure(s) Performed: COLONOSCOPY WITH PROPOFOL  Patient location during evaluation: PACU Anesthesia Type: General Level of consciousness: awake and alert Pain management: pain level controlled Vital Signs Assessment: post-procedure vital signs reviewed and stable Respiratory status: spontaneous breathing, nonlabored ventilation, respiratory function stable and patient connected to nasal cannula oxygen Cardiovascular status: blood pressure returned to baseline and stable Postop Assessment: no apparent nausea or vomiting Anesthetic complications: no   No notable events documented.   Last Vitals:  Vitals:   02/05/22 1005 02/05/22 1015  BP: 118/73 121/70  Pulse: 81 80  Resp: 13 15  Temp:  (!) 35.4 C  SpO2: 100% 100%    Last Pain:  Vitals:   02/06/22 0738  TempSrc:   PainSc: 0-No pain                 Molli Barrows

## 2022-05-23 ENCOUNTER — Other Ambulatory Visit: Payer: Self-pay | Admitting: Orthopedic Surgery

## 2022-05-23 DIAGNOSIS — G8929 Other chronic pain: Secondary | ICD-10-CM

## 2022-05-23 DIAGNOSIS — M1711 Unilateral primary osteoarthritis, right knee: Secondary | ICD-10-CM

## 2022-05-30 ENCOUNTER — Ambulatory Visit: Payer: Medicare HMO | Admitting: Physician Assistant

## 2022-05-30 VITALS — BP 133/85 | HR 93

## 2022-05-30 DIAGNOSIS — N390 Urinary tract infection, site not specified: Secondary | ICD-10-CM

## 2022-05-30 DIAGNOSIS — R82998 Other abnormal findings in urine: Secondary | ICD-10-CM

## 2022-05-30 DIAGNOSIS — Z87440 Personal history of urinary (tract) infections: Secondary | ICD-10-CM | POA: Diagnosis not present

## 2022-05-30 LAB — URINALYSIS, COMPLETE
Bilirubin, UA: NEGATIVE
Glucose, UA: NEGATIVE
Ketones, UA: NEGATIVE
Leukocytes,UA: NEGATIVE
Nitrite, UA: NEGATIVE
RBC, UA: NEGATIVE
Specific Gravity, UA: 1.015 (ref 1.005–1.030)
Urobilinogen, Ur: 1 mg/dL (ref 0.2–1.0)
pH, UA: 7 (ref 5.0–7.5)

## 2022-05-30 LAB — MICROSCOPIC EXAMINATION: Epithelial Cells (non renal): >10 /hpf — AB (ref 0–10)

## 2022-05-30 NOTE — Patient Instructions (Addendum)
Recurrent UTI Prevention Strategies  Start taking an over-the-counter cranberry supplement for urinary tract health. Take this once or twice daily on an empty stomach, e.g. right before bed. Start taking an over-the-counter d-mannose supplement. Take this daily per packaging instructions. Note: you may be able to find combined cranberry and d-mannose products in stores or online! Continue taking an over-the-counter probiotic containing the bacterial species called Lactobacillus. Take this daily. Continue vaginal estrogen cream. Apply a pea-sized amount around the opening of the urethra three times weekly forever.

## 2022-05-30 NOTE — Progress Notes (Signed)
05/30/2022 4:54 PM   Shannon Gallegos 08-16-1950 [id number]  CC: Chief Complaint  Patient presents with   Follow-up   HPI: Shannon Gallegos is a 72 y.o. female with PMH recurrent UTI who presents today for recurrent UTI follow-up.   Today she reports she has had multiple urinalyses with her PCP over the past 3 to 4 months and continues to have positive urine testing.  She has been treated with multiple courses of antibiotics, however she has been minimally symptomatic with this.  She is concerned about her persistent infections.  Outside records unavailable for my review unfortunately.  Patient reports some intermittent bladder pressure and urinary frequency at baseline but states she had no acute onset dysuria or worsening urgency/frequency over baseline associated with her recent positive urine testing.  Her PCP has started her on Keflex 250 mg daily x 90 days and she has been taking this for about 1 week.  She has also been started on daily probiotics and vaginal estrogen cream, which she has been using 3 times weekly since around July 2023.  She denies dysuria or worse urgency/frequency over baseline today.  In-office UA today positive for trace protein; urine microscopy with >10 epithelial cells/hpf.   PMH: Past Medical History:  Diagnosis Date   Arthritis    Borderline diabetes    GERD (gastroesophageal reflux disease)    Hypercholesterolemia    Hypertension    Vertigo     Surgical History: Past Surgical History:  Procedure Laterality Date   BREAST BIOPSY Left 2000   neg   BREAST BIOPSY Right 01/19/2020   [place] bx of LN, neg   CATARACT EXTRACTION Bilateral    COLONOSCOPY  05/2012   Dr. Ellender Hose: sigmoid and descending colon diverticula, ADENOMATOUS polyp. Due for surveillance 2019   COLONOSCOPY WITH PROPOFOL N/A 02/05/2022   Procedure: COLONOSCOPY WITH PROPOFOL;  Surgeon: Annamaria Helling, DO;  Location: [facility] ENDOSCOPY;  Service: Gastroenterology;  Laterality:  N/A;   ESOPHAGOGASTRODUODENOSCOPY (EGD) WITH ESOPHAGEAL DILATION N/A 12/09/2012   Dr. Oneida Alar: small hiatal hernia, Schatzki's ring at GE junction s/p Savary dilation, benign sessile polyps, moderate gastritis   KNEE ARTHROSCOPY WITH MEDIAL MENISECTOMY Left 04/28/2015   Procedure: LEFT KNEE ARTHROSCOPY WITH PARTIAL MEDIAL MENISECTOMY;  Surgeon: Carole Civil, MD;  Location: [facility];  Service: Orthopedics;  Laterality: Left;   KNEE SURGERY Bilateral    arthroscopies   TUBAL LIGATION      Home Medications:  Allergies as of 05/30/2022   No Known Allergies      Medication List        Accurate as of May 30, 2022  4:54 PM. If you have any questions, ask your nurse or doctor.          atorvastatin 40 MG tablet Commonly known as: LIPITOR Take 40 mg by mouth daily.   esomeprazole 40 MG capsule Commonly known as: NEXIUM Take 40 mg by mouth daily at 12 noon.   fluticasone 50 MCG/ACT nasal spray Commonly known as: FLONASE Place 2 sprays into the nose daily.   furosemide 20 MG tablet Commonly known as: LASIX   gabapentin 300 MG capsule Commonly known as: NEURONTIN Take 300 mg by mouth 3 (three) times daily.   hydrochlorothiazide 25 MG tablet Commonly known as: HYDRODIURIL Take 25 mg by mouth daily.   HYDROcodone-acetaminophen 5-325 MG tablet Commonly known as: NORCO/VICODIN TAKE ONE TABLET BY MOUTH EVERY 8 HOURS AS NEEDED FOR PAIN   meclizine 25 MG tablet Commonly known as:  ANTIVERT Take 25 mg by mouth 3 (three) times daily as needed for dizziness.   omeprazole 40 MG capsule Commonly known as: PRILOSEC   potassium chloride SA 20 MEQ tablet Commonly known as: KLOR-CON M Take 1 tablet (20 mEq total) by mouth daily.   predniSONE 10 MG (48) Tbpk tablet Commonly known as: STERAPRED UNI-PAK 48 TAB Take by mouth daily. 10 mg 12 days as directed   Vitamin D3 25 MCG (1000 UT) capsule Generic drug: Cholecalciferol Take by mouth.        Allergies:  No Known  Allergies  Family History: Family History  Problem Relation Age of Onset   Cirrhosis Father        alcoholic   Colon cancer Neg Hx    Breast cancer Neg Hx     Social History:   reports that she has never smoked. She has never used smokeless tobacco. She reports that she does not drink alcohol and does not use drugs.  Physical Exam: BP 133/85   Pulse 93   Constitutional:  Alert and oriented, no acute distress, nontoxic appearing HEENT: [place], AT Cardiovascular: No clubbing, cyanosis, or edema Respiratory: Normal respiratory effort, no increased work of breathing Skin: No rashes, bruises or suspicious lesions Neurologic: Grossly intact, no focal deficits, moving all 4 extremities Psychiatric: Normal mood and affect  Laboratory Data: Results for orders placed or performed in visit on 05/30/22  Microscopic Examination   Urine  Result Value Ref Range   WBC, UA 0-5 0 - 5 /hpf   RBC, Urine 0-2 0 - 2 /hpf   Epithelial Cells (non renal) >10 (A) 0 - 10 /hpf   Mucus, UA Present (A) Not Estab.   Bacteria, UA Few None seen/Few  Urinalysis, Complete  Result Value Ref Range   Specific Gravity, UA 1.015 1.005 - 1.030   pH, UA 7.0 5.0 - 7.5   Color, UA Yellow Yellow   Appearance Ur Clear Clear   Leukocytes,UA Negative Negative   Protein,UA Trace (A) Negative/Trace   Glucose, UA Negative Negative   Ketones, UA Negative Negative   RBC, UA Negative Negative   Bilirubin, UA Negative Negative   Urobilinogen, Ur 1.0 0.2 - 1.0 mg/dL   Nitrite, UA Negative Negative   Microscopic Examination See below:    Assessment & Plan:   1. Recurrent UTI Multiple recent rounds of urine testing concerning for UTI with no apparent acute infective symptoms associated.  Unfortunately, outside records are not available for me to review.  Today, her urine is bland, though it does appear contaminated.  Without reviewing her outside records, differential includes true recurrent UTIs versus urinary colonization  versus sample contamination with poor collection technique.  With what seems like minimal to no symptoms, I think urinary colonization versus sample contamination are more likely, though I cannot rule this out.  We discussed that asymptomatic bacteriuria/urinary colonization does not require antibiotic therapy unless she is undergoing urologic procedures or if she develops an acute infective symptoms.  We discussed UTI prevention techniques including continuing vaginal estrogen cream and daily probiotics and starting cranberry and d-mannose supplements as well if desired.  We discussed the role of vaginal estrogen cream in UTI prevention in postmenopausal women and she expressed understanding.  Will defer to her PCP for management of her suppressive antibiotics.  If this is truly asymptomatic bacteriuria, then there is no indication for these.  We discussed that I can see her in clinic for acute symptoms, though she [place] far  away and so getting to [place] may be more challenging. - Urinalysis, Complete  Return if symptoms worsen or fail to improve.  Debroah Loop, PA-C  [facility] [address] [place], [place] [address] [phone]

## 2022-05-31 ENCOUNTER — Ambulatory Visit: Payer: Medicare HMO | Admitting: Urology

## 2022-06-18 ENCOUNTER — Telehealth: Payer: Self-pay

## 2022-06-18 NOTE — Telephone Encounter (Addendum)
Shannon Gallegos, with [facility], left message on triage line stating they just faxed over a few more urine culture results they have for the patient that they wanted to be reviewed. They wanted to just make sure based on these results if it would still be sincere asymptomatic bacteria issue or changes plan of care. Will send to Sam for review, will be on the look out for the results/faxes. Their CB is [phone]

## 2022-06-18 NOTE — Telephone Encounter (Signed)
I have reviewed the outside medical records. Unfortunately no clinic notes attached to correlate patient symptoms with urine testing.   Differential continues to include recurrent UTI vs asymptomatic bacteriuria vs sample contamination. I continue to recommend only treating her with antibiotics if she is having acute infection symptoms including dysuria, urge/frequency over baseline, low abdominal pain, low back pain, flank pain, fever, chills, nausea, vomiting.  Agree with topical vaginal estrogen cream and probiotics, as well as consideration of adding cranberry supplements as discussed in clinic. If she has frequent symptomatic infections, ok to start daily suppressive antibiotics for UTI prevention as well. I do not recommend antibiotics in the absence of symptoms. I can see her for acute visits as desired.

## 2022-06-19 NOTE — Telephone Encounter (Signed)
Tried calling [facility] and was on the phone for 4:26 being transferred around. Left message to have patient return my call.

## 2022-06-21 NOTE — Telephone Encounter (Signed)
Spoke with [place] at [facility] and advised results.

## 2022-11-08 ENCOUNTER — Other Ambulatory Visit: Payer: Self-pay | Admitting: Family Medicine

## 2022-11-08 DIAGNOSIS — Z1231 Encounter for screening mammogram for malignant neoplasm of breast: Secondary | ICD-10-CM

## 2022-12-06 ENCOUNTER — Ambulatory Visit
Admission: RE | Admit: 2022-12-06 | Discharge: 2022-12-06 | Disposition: A | Discharge status: Home / Self Care | Payer: Medicare HMO | Source: Ambulatory Visit | Attending: Family Medicine | Admitting: Family Medicine

## 2022-12-06 DIAGNOSIS — Z1231 Encounter for screening mammogram for malignant neoplasm of breast: Secondary | ICD-10-CM | POA: Diagnosis present

## 2023-02-04 ENCOUNTER — Ambulatory Visit: Payer: Medicare HMO | Admitting: Orthopedic Surgery

## 2023-02-07 ENCOUNTER — Ambulatory Visit: Payer: Medicare HMO | Admitting: Orthopedic Surgery

## 2023-02-07 ENCOUNTER — Encounter: Payer: Self-pay | Admitting: Orthopedic Surgery

## 2023-02-07 ENCOUNTER — Other Ambulatory Visit (INDEPENDENT_AMBULATORY_CARE_PROVIDER_SITE_OTHER): Payer: Self-pay

## 2023-02-07 ENCOUNTER — Other Ambulatory Visit: Payer: Self-pay

## 2023-02-07 VITALS — BP 117/72 | HR 96 | Ht 63.0 in | Wt 204.0 lb

## 2023-02-07 DIAGNOSIS — G8929 Other chronic pain: Secondary | ICD-10-CM

## 2023-02-07 DIAGNOSIS — M170 Bilateral primary osteoarthritis of knee: Secondary | ICD-10-CM

## 2023-02-07 MED ORDER — DICLOFENAC SODIUM 1 % EX GEL: 4.0000 g | Freq: Four times a day (QID) | CUTANEOUS | 3 refills | Status: DC

## 2023-02-07 MED ORDER — METHYLPREDNISOLONE ACETATE 40 MG/ML IJ SUSP
40.0000 mg | Freq: Once | INTRAMUSCULAR | Status: AC
  Administered 2023-02-07: 40 mg via INTRA_ARTICULAR

## 2023-02-07 NOTE — Progress Notes (Signed)
Yearly follow-up  Encounter Diagnoses  Name Primary?   Chronic pain of both knees Yes   Bilateral primary osteoarthritis of knee     Ms. Shannon Gallegos is doing well.  She would like injections in both knees  X-rays were done she has bilateral knee arthritis with mild varus deformities she has been stable over the last few years  She did ask for a some Voltaren gel  Meds ordered this encounter  Medications   methylPREDNISolone acetate (DEPO-MEDROL) injection 40 mg   methylPREDNISolone acetate (DEPO-MEDROL) injection 40 mg   diclofenac Sodium (VOLTAREN) 1 % GEL    Sig: Apply 4 g topically 4 (four) times daily.    Dispense:  300 g    Refill:  3   Procedure note for bilateral knee injections  Procedure note left knee injection verbal consent was obtained to inject left knee joint  Timeout was completed to confirm the site of injection  The medications used were 40 mg depomedrol and 3 cc of 1% lidocaine  Anesthesia was provided by ethyl chloride and the skin was prepped with alcohol.  After cleaning the skin with alcohol a 20-gauge needle was used to inject the left knee joint. There were no complications. A sterile bandage was applied.   Procedure note right knee injection verbal consent was obtained to inject right knee joint  Timeout was completed to confirm the site of injection  The medications used were 40 mg depomedrol and 3 cc of 1% lidocaine  Anesthesia was provided by ethyl chloride and the skin was prepped with alcohol.  After cleaning the skin with alcohol a 20-gauge needle was used to inject the right knee joint. There were no complications. A sterile bandage was applied.

## 2023-02-25 ENCOUNTER — Telehealth: Payer: Self-pay | Admitting: Orthopedic Surgery

## 2023-02-25 ENCOUNTER — Other Ambulatory Visit: Payer: Self-pay | Admitting: Orthopedic Surgery

## 2023-02-25 DIAGNOSIS — M545 Low back pain, unspecified: Secondary | ICD-10-CM

## 2023-02-25 DIAGNOSIS — M1711 Unilateral primary osteoarthritis, right knee: Secondary | ICD-10-CM

## 2023-02-25 DIAGNOSIS — M170 Bilateral primary osteoarthritis of knee: Secondary | ICD-10-CM

## 2023-02-25 MED ORDER — DICLOFENAC SODIUM 75 MG PO TBEC: 75.0000 mg | DELAYED_RELEASE_TABLET | Freq: Two times a day (BID) | ORAL | 0 refills | Status: AC

## 2023-02-25 MED ORDER — HYDROCODONE-ACETAMINOPHEN 5-325 MG PO TABS: 1.0000 | ORAL_TABLET | Freq: Three times a day (TID) | ORAL | 0 refills | Status: DC | PRN

## 2023-02-25 MED ORDER — PREDNISONE 10 MG (48) PO TBPK: ORAL_TABLET | Freq: Every day | ORAL | 5 refills | Status: DC

## 2023-02-25 NOTE — Progress Notes (Signed)
Meds ordered this encounter  Medications   diclofenac (VOLTAREN) 75 MG EC tablet    Sig: Take 1 tablet (75 mg total) by mouth 2 (two) times daily.    Dispense:  30 tablet    Refill:  0   HYDROcodone-acetaminophen (NORCO/VICODIN) 5-325 MG tablet    Sig: Take 1 tablet by mouth every 8 (eight) hours as needed. for pain    Dispense:  42 tablet    Refill:  0   predniSONE (STERAPRED UNI-PAK 48 TAB) 10 MG (48) TBPK tablet    Sig: Take by mouth daily. 10 mg 12 days as directed    Dispense:  48 tablet    Refill:  5

## 2023-02-25 NOTE — Telephone Encounter (Signed)
Dr. Mort Sawyers pt - spoke w/the pt, she stated that she was seen on 02/07/23 and Dr. Romeo Apple was going to send in Prednisone 48 tablet, Take by mouth daily. 10 mg 12 days as directed, Hydrocodone 5-325, 42 tablets, every 8 hours PRN and Diclofenac 300g, 4 times daily to [facility].  It looks like the Diclofenac was sent to [facility] on 02/07/23 and she stated she has not gotten in yet.  She stated she can pick all these up at [facility] in 5 minutes.

## 2023-10-15 ENCOUNTER — Other Ambulatory Visit: Payer: Self-pay | Admitting: Family Medicine

## 2023-10-15 DIAGNOSIS — Z1231 Encounter for screening mammogram for malignant neoplasm of breast: Secondary | ICD-10-CM

## 2023-10-16 ENCOUNTER — Other Ambulatory Visit: Payer: Self-pay | Admitting: Family Medicine

## 2023-10-16 DIAGNOSIS — Z13820 Encounter for screening for osteoporosis: Secondary | ICD-10-CM

## 2023-12-02 ENCOUNTER — Other Ambulatory Visit (HOSPITAL_COMMUNITY)
Admission: RE | Admit: 2023-12-02 | Discharge: 2023-12-02 | Disposition: A | Discharge status: Home / Self Care | Source: Ambulatory Visit | Attending: Obstetrics & Gynecology | Admitting: Obstetrics & Gynecology

## 2023-12-02 ENCOUNTER — Ambulatory Visit: Admitting: Obstetrics & Gynecology

## 2023-12-02 ENCOUNTER — Encounter: Payer: Self-pay | Admitting: Obstetrics & Gynecology

## 2023-12-02 VITALS — BP 105/71 | HR 88 | Ht 62.0 in | Wt 204.5 lb

## 2023-12-02 DIAGNOSIS — N950 Postmenopausal bleeding: Secondary | ICD-10-CM | POA: Diagnosis not present

## 2023-12-02 DIAGNOSIS — N898 Other specified noninflammatory disorders of vagina: Secondary | ICD-10-CM

## 2023-12-02 DIAGNOSIS — C541 Malignant neoplasm of endometrium: Secondary | ICD-10-CM | POA: Insufficient documentation

## 2023-12-02 NOTE — Progress Notes (Signed)
 GYN VISIT Patient name: Shannon Gallegos MRN [id number]  Date of birth: November 25, 1950 Chief Complaint:   IRREGULAR BLEEDING (pmb)  History of Present Illness:   Shannon Gallegos is a 73 y.o. [phone] PM female being seen today for the following concerns:  PMB: Started in August- on/off for about 2 weeks.  Required a pantyliner- sometimes dark red other times it was pink.  Also appeared mucous-like.  Denies pelvic or abdominal pain.  Also noted a yellow to green/sage color- also sporadic.  Sometimes a few days then resolve- this only happened a few days.  Reports no other acute complaints  Denies hematuria and notes that she was seen in [facility] checked UA and negative.  No LMP recorded. Patient is postmenopausal.    Review of Systems:   Pertinent items are noted in HPI Denies fever/chills, dizziness, headaches, visual disturbances, fatigue, shortness of breath, chest pain, abdominal pain, vomiting Pertinent History Reviewed:   Past Surgical History:  Procedure Laterality Date   BREAST BIOPSY Left 2000   neg   BREAST BIOPSY Right 01/19/2020   us  bx of LN, neg   CATARACT EXTRACTION Bilateral    COLONOSCOPY  05/2012   Dr. Angelena: sigmoid and descending colon diverticula, ADENOMATOUS polyp. Due for surveillance 2019   COLONOSCOPY WITH PROPOFOL  N/A 02/05/2022   Procedure: COLONOSCOPY WITH PROPOFOL ;  Surgeon: Onita Elspeth Sharper, DO;  Location: [facility] ENDOSCOPY;  Service: Gastroenterology;  Laterality: N/A;   ESOPHAGOGASTRODUODENOSCOPY (EGD) WITH ESOPHAGEAL DILATION N/A 12/09/2012   Dr. Harvey: small hiatal hernia, Schatzki's ring at GE junction s/p Savary dilation, benign sessile polyps, moderate gastritis   KNEE ARTHROSCOPY WITH MEDIAL MENISECTOMY Left 04/28/2015   Procedure: LEFT KNEE ARTHROSCOPY WITH PARTIAL MEDIAL MENISECTOMY;  Surgeon: Taft FORBES Minerva, MD;  Location: [facility];  Service: Orthopedics;  Laterality: Left;   KNEE SURGERY Bilateral    arthroscopies    TUBAL LIGATION      Past Medical History:  Diagnosis Date   Arthritis    Borderline diabetes    GERD (gastroesophageal reflux disease)    Hypercholesterolemia    Hypertension    Vertigo    Reviewed problem list, medications and allergies. Physical Assessment:   Vitals:   12/02/23 0903  BP: 105/71  Pulse: 88  Weight: 204 lb 8 oz (92.8 kg)  Height: 5' 2 (1.575 m)  Body mass index is 37.4 kg/m.       Physical Examination:   General appearance: alert, well appearing, and in no distress  Psych: mood appropriate, normal affect  Skin: warm & dry   Cardiovascular: normal heart rate noted  Respiratory: normal respiratory effort, no distress  Abdomen: obese, soft, non-tender   Pelvic: VULVA: normal appearing vulva with no masses, tenderness or lesions, VAGINA: normal appearing vagina with normal color and discharge, no lesions, CERVIX: normal appearing cervix without discharge or lesions, UTERUS: uterus is normal size, shape, consistency and nontender, ADNEXA: normal adnexa in size, nontender and no masses  Extremities: no edema, no calf tenderness bilaterally  Chaperone: Aleck Blase    Endometrial Biopsy Procedure Note  Pre-operative Diagnosis: PMB  Post-operative Diagnosis: same  Procedure Details  The risks (including infection, bleeding, pain, and uterine perforation) and benefits of the procedure were explained to the patient and Written informed consent was obtained.  Antibiotic prophylaxis against endocarditis was not indicated.   The patient was placed in the dorsal lithotomy position.  Bimanual exam showed the uterus to be in the neutral position.  A speculum inserted in  the vagina, and the cervix prepped with betadine.     A single tooth tenaculum was applied to the anterior lip of the cervix for stabilization.  Os finder was used.  A Pipelle endometrial aspirator was used to sample the endometrium.  Sample was sent for pathologic  examination.  Condition: Stable  Complications: None  Plan:    Assessment & Plan:  1) PMB, Vaginal discharge -Vaginitis panel obtained to rule out underlying infection -pelvic US  to be completed - See above, EMB completed Next step pending results of pathology. The patient was advised to call for any fever or for prolonged or severe pain or bleeding. She was advised to use OTC analgesics as needed for mild to moderate pain. She was advised to avoid vaginal intercourse for 48 hours or until the bleeding has completely stopped. -   Orders Placed This Encounter  Procedures   US  PELVIC COMPLETE WITH TRANSVAGINAL    Return for pelvic US  ([facility]), follow up TBD.   Yasenia Reedy, DO Attending Obstetrician & Gynecologist, [facility] for [employer], [facility]

## 2023-12-03 ENCOUNTER — Ambulatory Visit: Payer: Self-pay | Admitting: Obstetrics & Gynecology

## 2023-12-03 ENCOUNTER — Telehealth: Payer: Self-pay | Admitting: *Deleted

## 2023-12-03 ENCOUNTER — Other Ambulatory Visit: Payer: Self-pay | Admitting: Obstetrics & Gynecology

## 2023-12-03 DIAGNOSIS — N950 Postmenopausal bleeding: Secondary | ICD-10-CM

## 2023-12-03 LAB — CERVICOVAGINAL ANCILLARY ONLY
Bacterial Vaginitis (gardnerella): NEGATIVE
Candida Glabrata: NEGATIVE
Candida Vaginitis: NEGATIVE
Comment: NEGATIVE
Comment: NEGATIVE
Comment: NEGATIVE

## 2023-12-03 LAB — SURGICAL PATHOLOGY

## 2023-12-03 NOTE — Telephone Encounter (Signed)
 Spoke with the patient regarding the referral to GYN oncology. Patient scheduled as new patient with Dr Viktoria on 9/19 at 10:30 am. Patient given an arrival time of *10 am..  Explained to the patient the the doctor will perform a pelvic exam at this visit. Patient given the policy that only one visitor allowed and that visitor must be over 16 yrs are allowed in the Cancer Center. Patient given the address/phone number for the clinic and that the center offers free valet service. Patient aware that masks optional.

## 2023-12-03 NOTE — Progress Notes (Signed)
 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

## 2023-12-05 ENCOUNTER — Telehealth: Payer: Self-pay

## 2023-12-05 ENCOUNTER — Telehealth: Payer: Self-pay | Admitting: Obstetrics & Gynecology

## 2023-12-05 NOTE — Telephone Encounter (Signed)
 RN called patient back after message was left for office for someone to get in touch with her. Patient wanted to speak with Dr Marilynn phone called transferred to MD

## 2023-12-05 NOTE — Telephone Encounter (Signed)
 Pt is requesting a call back from the office.

## 2023-12-06 ENCOUNTER — Encounter: Payer: Self-pay | Admitting: Psychiatry

## 2023-12-06 ENCOUNTER — Ambulatory Visit (HOSPITAL_COMMUNITY)
Admission: RE | Admit: 2023-12-06 | Discharge: 2023-12-06 | Disposition: A | Discharge status: Home / Self Care | Source: Ambulatory Visit | Attending: Obstetrics & Gynecology | Admitting: Obstetrics & Gynecology

## 2023-12-06 DIAGNOSIS — N950 Postmenopausal bleeding: Secondary | ICD-10-CM | POA: Diagnosis present

## 2023-12-09 ENCOUNTER — Ambulatory Visit
Admission: RE | Admit: 2023-12-09 | Discharge: 2023-12-09 | Disposition: A | Discharge status: Home / Self Care | Source: Ambulatory Visit | Attending: Family Medicine

## 2023-12-09 ENCOUNTER — Inpatient Hospital Stay: Attending: Psychiatry | Admitting: Psychiatry

## 2023-12-09 ENCOUNTER — Inpatient Hospital Stay: Admitting: Gynecologic Oncology

## 2023-12-09 ENCOUNTER — Encounter: Payer: Self-pay | Admitting: Psychiatry

## 2023-12-09 ENCOUNTER — Ambulatory Visit
Admission: RE | Admit: 2023-12-09 | Discharge: 2023-12-09 | Disposition: A | Discharge status: Home / Self Care | Source: Ambulatory Visit | Attending: Family Medicine | Admitting: Family Medicine

## 2023-12-09 VITALS — BP 137/73 | HR 93 | Temp 98.4°F | Resp 20 | Ht 62.0 in | Wt 201.6 lb

## 2023-12-09 DIAGNOSIS — R42 Dizziness and giddiness: Secondary | ICD-10-CM | POA: Insufficient documentation

## 2023-12-09 DIAGNOSIS — E7800 Pure hypercholesterolemia, unspecified: Secondary | ICD-10-CM | POA: Diagnosis not present

## 2023-12-09 DIAGNOSIS — Z1231 Encounter for screening mammogram for malignant neoplasm of breast: Secondary | ICD-10-CM | POA: Insufficient documentation

## 2023-12-09 DIAGNOSIS — Z79899 Other long term (current) drug therapy: Secondary | ICD-10-CM | POA: Insufficient documentation

## 2023-12-09 DIAGNOSIS — M1990 Unspecified osteoarthritis, unspecified site: Secondary | ICD-10-CM | POA: Diagnosis not present

## 2023-12-09 DIAGNOSIS — C541 Malignant neoplasm of endometrium: Secondary | ICD-10-CM | POA: Insufficient documentation

## 2023-12-09 DIAGNOSIS — K219 Gastro-esophageal reflux disease without esophagitis: Secondary | ICD-10-CM | POA: Diagnosis not present

## 2023-12-09 DIAGNOSIS — Z791 Long term (current) use of non-steroidal anti-inflammatories (NSAID): Secondary | ICD-10-CM | POA: Insufficient documentation

## 2023-12-09 DIAGNOSIS — Z13820 Encounter for screening for osteoporosis: Secondary | ICD-10-CM | POA: Diagnosis present

## 2023-12-09 DIAGNOSIS — M8589 Other specified disorders of bone density and structure, multiple sites: Secondary | ICD-10-CM | POA: Insufficient documentation

## 2023-12-09 DIAGNOSIS — I10 Essential (primary) hypertension: Secondary | ICD-10-CM | POA: Diagnosis not present

## 2023-12-09 DIAGNOSIS — Z801 Family history of malignant neoplasm of trachea, bronchus and lung: Secondary | ICD-10-CM | POA: Insufficient documentation

## 2023-12-09 DIAGNOSIS — Z780 Asymptomatic menopausal state: Secondary | ICD-10-CM | POA: Insufficient documentation

## 2023-12-09 MED ORDER — OXYCODONE HCL 5 MG PO TABS: 5.0000 mg | ORAL_TABLET | ORAL | 0 refills | Status: AC | PRN

## 2023-12-09 MED ORDER — SENNOSIDES-DOCUSATE SODIUM 8.6-50 MG PO TABS: 2.0000 | ORAL_TABLET | Freq: Every day | ORAL | 0 refills | Status: AC

## 2023-12-09 NOTE — Progress Notes (Signed)
 GYNECOLOGIC ONCOLOGY NEW PATIENT CONSULTATION  Date of Service: 12/09/2023 Referring Provider: Jennifer Ozan, DO   ASSESSMENT AND PLAN: Shannon Gallegos is a 73 y.o. 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  We reviewed the nature of endometrial cancer and its recommended surgical staging, including total hysterectomy, bilateral salpingo-oophorectomy, and lymph node assessment. The patient is a suitable candidate for staging via a minimally invasive approach to surgery.  We reviewed that robotic assistance would be used to complete the surgery.  We discussed that most endometrial cancer is detected early and that decisions regarding adjuvant therapy will be made based on her final pathology.   We reviewed the sentinel lymph node technique. Risks and benefits of sentinel lymph node biopsy was reviewed. We reviewed the technique and ICG dye. The patient DOES NOT have an iodine allergy or known liver dysfunction. We reviewed the false negative rate (0.4%), and that 3% of patients with metastatic disease will not have it detected by SLN biopsy in endometrial cancer. A low risk of allergic reaction to the dye, <0.2% for ICG, has been reported. We also discussed that in the case of failed mapping, which occurs 40% of the time, a bilateral or unilateral lymphadenectomy will be performed at the surgeon's discretion.   Potential benefits of sentinel nodes including a higher detection rate for metastasis due to ultrastaging and potential reduction in operative morbidity. However, there remains uncertainty as to the role for treatment of micrometastatic disease. Further, the benefit of operative morbidity associated with the SLN technique in endometrial cancer is not yet completely known. In other patient populations (e.g. the cervical cancer population) there has been observed reductions in morbidity with SLN biopsy compared to pelvic lymphadenectomy. Lymphedema,  nerve dysfunction and lymphocysts are all potential risks with the SLN technique as with complete lymphadenectomy. Additional risks to the patient include the risk of damage to an internal organ while operating in an altered view (e.g. the black and white image of the robotic fluorescence imaging mode).   Patient was consented for: Robotic assisted total laparoscopic hysterectomy, bilateral salpingo-oophorectomy, sentinel lymph node evaluation and biopsy, possible lymph node dissection on 01/07/24.  The risks of surgery were discussed in detail and she understands these to including but not limited to bleeding requiring a blood transfusion, infection, injury to adjacent organs (including but not limited to the bowels, bladder, ureters, nerves, blood vessels), thromboembolic events, wound separation, hernia, vaginal cuff separation, possible risk of lymphedema and lymphocyst if lymphadenectomy performed, unforseen complication, possible need for re-exploration, and medical complications such as heart attack, stroke, pneumonia.  If the patient experiences any of these events, she understands that her hospitalization or recovery may be prolonged and that she may need to take additional medications for a prolonged period. The patient will receive DVT and antibiotic prophylaxis as indicated. She voiced a clear understanding. She had the opportunity to ask questions and informed consent was obtained today. She wishes to proceed.  She does not require preoperative clearance. Her METs are >4.  All preoperative instructions were reviewed. Postoperative expectations were also reviewed. Written handouts were provided to the patient.   A copy of this note was sent to the patient's referring provider.  Hoy Masters, MD Gynecologic Oncology   Medical Decision Making I personally spent  TOTAL 55 minutes face-to-face and non-face-to-face in the care of this patient, which includes all pre, intra, and post  visit time on the date of service.   ------------  CC: Endometrial cancer  HISTORY OF PRESENT ILLNESS:  Shannon Gallegos is a 73 y.o. woman who is seen in consultation at the request of Delon Prude, DO for evaluation of endometrial cancer.  Patient presented to her OB/GYN for evaluation of postmenopausal bleeding.  She noted that this started in August, on and off for about 2 weeks.  An endometrial biopsy was performed which returned with well-differentiated endometrioid adenocarcinoma, FIGO grade 1 arising within a background of extensive EIN.  A pelvic ultrasound was subsequently completed on/12/25 which noted a uterus measuring 8.1 x 8.2 x 7.4 cm with a fibroid measuring 7.4 x 6.2 x 5.3 cm.  Endometrium not well-visualized due to the large fibroid.  Today patient presents with her husband.  Endorses a history as above.    PAST MEDICAL HISTORY: Past Medical History:  Diagnosis Date   Arthritis    Borderline diabetes    GERD (gastroesophageal reflux disease)    Hypercholesterolemia    Hypertension    Vertigo     PAST SURGICAL HISTORY: Past Surgical History:  Procedure Laterality Date   BREAST BIOPSY Left 2000   neg   BREAST BIOPSY Right 01/19/2020   us  bx of LN, neg   CATARACT EXTRACTION Bilateral    COLONOSCOPY  05/2012   Dr. Angelena: sigmoid and descending colon diverticula, ADENOMATOUS polyp. Due for surveillance 2019   COLONOSCOPY WITH PROPOFOL  N/A 02/05/2022   Procedure: COLONOSCOPY WITH PROPOFOL ;  Surgeon: Onita Elspeth Sharper, DO;  Location: [facility] ENDOSCOPY;  Service: Gastroenterology;  Laterality: N/A;   ESOPHAGOGASTRODUODENOSCOPY (EGD) WITH ESOPHAGEAL DILATION N/A 12/09/2012   Dr. Harvey: small hiatal hernia, Schatzki's ring at GE junction s/p Savary dilation, benign sessile polyps, moderate gastritis   KNEE ARTHROSCOPY WITH MEDIAL MENISECTOMY Left 04/28/2015   Procedure: LEFT KNEE ARTHROSCOPY WITH PARTIAL MEDIAL MENISECTOMY;  Surgeon: Taft FORBES Minerva, MD;   Location: [facility];  Service: Orthopedics;  Laterality: Left;   KNEE SURGERY Bilateral    arthroscopies   TUBAL LIGATION      OB/GYN HISTORY: OB History  Gravida Para Term Preterm AB Living  3 3 3      SAB IAB Ectopic Multiple Live Births          # Outcome Date GA Lbr Len/2nd Weight Sex Type Anes PTL Lv  3 Term 38    M Vag-Spont     2 Term 64    M Vag-Spont     1 Term 2    F Vag-Spont         Age at menarche: 30 Age at menopause: 55 Hx of HRT: no Hx of STI: no Last pap: 2024 per pt report History of abnormal pap smears: no  SCREENING STUDIES:  Last mammogram: 2025 Last colonoscopy: 01/2022  MEDICATIONS:  Current Outpatient Medications:    atorvastatin (LIPITOR) 40 MG tablet, Take 40 mg by mouth daily., Disp: , Rfl:    Cholecalciferol (VITAMIN D3) 25 MCG (1000 UT) CAPS, Take by mouth., Disp: , Rfl:    diclofenac  (VOLTAREN ) 75 MG EC tablet, Take 1 tablet (75 mg total) by mouth 2 (two) times daily. (Patient taking differently: Take 75 mg by mouth. As needed), Disp: 30 tablet, Rfl: 0   DULoxetine (CYMBALTA) 30 MG capsule, Take 30 mg by mouth daily. (Patient taking differently: Take 30 mg by mouth daily. PRN), Disp: , Rfl:    furosemide (LASIX) 20 MG tablet, , Disp: , Rfl: 0   hydrochlorothiazide (HYDRODIURIL) 25 MG tablet, Take 25 mg by mouth daily., Disp: ,  Rfl:    meclizine (ANTIVERT) 25 MG tablet, Take 25 mg by mouth 3 (three) times daily as needed for dizziness. , Disp: , Rfl:    omeprazole (PRILOSEC) 40 MG capsule, , Disp: , Rfl: 1   potassium chloride  SA (K-DUR,KLOR-CON ) 20 MEQ tablet, Take 1 tablet (20 mEq total) by mouth daily., Disp: 30 tablet, Rfl: 5   clobetasol ointment (TEMOVATE) 0.05 %, Apply topically 2 (two) times daily., Disp: , Rfl:   ALLERGIES: Allergies  Allergen Reactions   Sulfamethoxazole-Trimethoprim Other (See Comments)    Mouth sores   Trazodone Other (See Comments)    drowsy    FAMILY HISTORY: Family History  Problem Relation Age of  Onset   Cirrhosis Father        alcoholic   Lung cancer Father    Lung cancer Sister    Cancer Brother        unknown type   Colon cancer Neg Hx    Breast cancer Neg Hx    Ovarian cancer Neg Hx    Endometrial cancer Neg Hx     SOCIAL HISTORY: Social History   Socioeconomic History   Marital status: Married    Spouse name: Not on file   Number of children: Not on file   Years of education: Not on file   Highest education level: Not on file  Occupational History   Occupation: retired  Tobacco Use   Smoking status: Never   Smokeless tobacco: Never  Vaping Use   Vaping status: Never Used  Substance and Sexual Activity   Alcohol use: No   Drug use: No   Sexual activity: Not Currently    Birth control/protection: Post-menopausal  Other Topics Concern   Not on file  Social History Narrative   Not on file   Social Drivers of Health   Financial Resource Strain: Low Risk  (12/02/2023)   Overall Financial Resource Strain (CARDIA)    Difficulty of Paying Living Expenses: Not very hard  Food Insecurity: No Food Insecurity (12/02/2023)   Hunger Vital Sign    Worried About Running Out of Food in the Last Year: Never true    Ran Out of Food in the Last Year: Never true  Transportation Needs: No Transportation Needs (12/02/2023)   PRAPARE - Administrator, Civil Service (Medical): No    Lack of Transportation (Non-Medical): No  Physical Activity: Inactive (12/02/2023)   Exercise Vital Sign    Days of Exercise per Week: 0 days    Minutes of Exercise per Session: 10 min  Stress: Stress Concern Present (12/02/2023)   [employer] of Occupational Health - Occupational Stress Questionnaire    Feeling of Stress: To some extent  Social Connections: Socially Integrated (12/02/2023)   Social Connection and Isolation Panel    Frequency of Communication with Friends and Family: More than three times a week    Frequency of Social Gatherings with Friends and Family: Three times a  week    Attends Religious Services: More than 4 times per year    Active Member of Clubs or Organizations: Yes    Attends Banker Meetings: More than 4 times per year    Marital Status: Married  Catering manager Violence: Not At Risk (12/02/2023)   Humiliation, Afraid, Rape, and Kick questionnaire    Fear of Current or Ex-Partner: No    Emotionally Abused: No    Physically Abused: No    Sexually Abused: No    REVIEW OF SYSTEMS:  New patient intake form was reviewed.  Complete 10-system review is negative except for the following: Anxiety, vaginal bleeding  PHYSICAL EXAM: BP 137/73 (BP Location: Right Arm, Patient Position: Sitting)   Pulse 93   Temp 98.4 F (36.9 C) (Oral)   Resp 20   Ht 5' 2 (1.575 m)   Wt 201 lb 9.6 oz (91.4 kg)   SpO2 100%   BMI 36.87 kg/m  Constitutional: No acute distress. Neuro/Psych: Alert, oriented.  Head and Neck: Normocephalic, atraumatic. Neck symmetric without masses. Sclera anicteric.  Respiratory: Normal work of breathing. Clear to auscultation bilaterally. Cardiovascular: Regular rate and rhythm, no murmurs, rubs, or gallops. Abdomen: Normoactive bowel sounds. Soft, non-distended, non-tender to palpation. No masses appreciated. Well healed umbilical incision. Extremities: Grossly normal range of motion. Warm, well perfused. Non pitting edema in bilateral ankles. Skin: No rashes or lesions. Lymphatic: No cervical, supraclavicular, or inguinal adenopathy. Genitourinary: External genitalia without lesions. Urethral meatus without lesions or prolapse. On speculum exam, vagina and cervix without lesions. Bimanual exam reveals retroverted uterus with fundal fibroid, overall mildly enlarged uterus, mobile. Exam chaperoned by Eleanor Epps, NP   LABORATORY AND RADIOLOGIC DATA: Outside medical records were reviewed to synthesize the above history, along with the history and physical obtained during the visit.  Outside laboratory, pathology,  and imaging reports were reviewed, with pertinent results below.  I personally reviewed the outside images.  WBC  Date Value Ref Range Status  04/26/2015 5.7 4.0 - 10.5 K/uL Final   Hemoglobin  Date Value Ref Range Status  04/28/2015 13.9 12.0 - 15.0 g/dL Final   HCT  Date Value Ref Range Status  04/28/2015 41.0 36.0 - 46.0 % Final   Platelets  Date Value Ref Range Status  04/26/2015 189 150 - 400 K/uL Final   Creatinine, Ser  Date Value Ref Range Status  04/26/2015 0.82 0.44 - 1.00 mg/dL Final    Surgical pathology (12/02/23): A. ENDOMETRIUM, BIOPSY:  Well-differentiated endometrioid adenocarcinoma, FIGO I  Tumor arises within a background of extensive complex atypical  hyperplasia (EIN) and acute inflammation   COMMENT:   Given the focality of the adenocarcinoma within the extensive background  EIN/complex atypical hyperplasia and the degree of acute inflammation,  immunohistochemical staining for p53 and MMR will be deferred to a  hysterectomy specimen.    US  PELVIC COMPLETE WITH TRANSVAGINAL 12/06/2023  Narrative CLINICAL DATA:  Postmenopausal bleeding for 3 days.  EXAM: TRANSABDOMINAL AND TRANSVAGINAL ULTRASOUND OF PELVIS  TECHNIQUE: Both transabdominal and transvaginal ultrasound examinations of the pelvis were performed. Transabdominal technique was performed for global imaging of the pelvis including uterus, ovaries, adnexal regions, and pelvic cul-de-sac. It was necessary to proceed with endovaginal exam following the transabdominal exam to visualize the uterus and adnexal regions.  COMPARISON:  None Available.  FINDINGS: Uterus  Measurements: 8.1 x 8.2 x 7.4 cm = volume: 262 mL. Fibroid measuring 7.4 x 6.2 x 5.3 cm is noted.  Endometrium  Not well visualized due to large fibroid.  Right ovary  Not visualized.  Left ovary  Not visualized.  Other findings  No abnormal free fluid.  IMPRESSION: 7.4 cm uterine fibroid is noted.  Endometrium is not well visualized due to large fibroid. Ovaries are not visualized.   Electronically Signed By: Lynwood Landy Raddle M.D. On: 12/06/2023 16:24

## 2023-12-09 NOTE — Patient Instructions (Signed)
 Preparing for your Surgery  Plan for surgery on January 07, 2024 with Dr. Hoy Masters at [facility]. You will be scheduled for robotic assisted total laparoscopic hysterectomy (removal of the uterus and cervix), bilateral salpingo-oophorectomy (removal of both ovaries and fallopian tubes), sentinel lymph node biopsy, possible lymph node dissection, possible laparotomy (larger incision on your abdomen if needed).  Pre-operative Testing -You will receive a phone call from presurgical testing at [facility] to arrange for a pre-operative appointment and lab work.  -Bring your insurance card, copy of an advanced directive if applicable, medication list  -At that visit, you will be asked to sign a consent for a possible blood transfusion in case a transfusion becomes necessary during surgery.  The need for a blood transfusion is rare but having consent is a necessary part of your care.     -You should not be taking blood thinners or aspirin at least ten days prior to surgery unless instructed by your surgeon.  DO NOT TAKE VOLTAREN  TABLETS FOR AT LEAST 10 DAYS BEFORE SURGERY.  -Do not take supplements such as fish oil (omega 3), red yeast rice, turmeric before your surgery. STOP TAKING AT LEAST 10 DAYS BEFORE SURGERY. You want to avoid medications with aspirin in them including headache powders such as BC or Goody's), Excedrin migraine.  -If you are taking a GLP-1 medication/injection such as Ozempic, Mounjaro, [id number], this needs to be held before surgery for at least 7 days before.  Day Before Surgery at Home -You will be asked to take in a light diet the day before surgery. You will be advised you can have clear liquids up until 3 hours before your surgery.    Eat a light diet the day before surgery.  Examples including soups, broths, toast, yogurt, mashed potatoes.  AVOID GAS PRODUCING FOODS AND BEVERAGES. Things to avoid include carbonated beverages (fizzy beverages,  sodas), raw fruits and raw vegetables (uncooked), or beans.   If your bowels are filled with gas, your surgeon will have difficulty visualizing your pelvic organs which increases your surgical risks.  Your role in recovery Your role is to become active as soon as directed by your doctor, while still giving yourself time to heal.  Rest when you feel tired. You will be asked to do the following in order to speed your recovery:  - Cough and breathe deeply. This helps to clear and expand your lungs and can prevent pneumonia after surgery.  - STAY ACTIVE WHEN YOU GET HOME. Do mild physical activity. Walking or moving your legs help your circulation and body functions return to normal. Do not try to get up or walk alone the first time after surgery.   -If you develop swelling on one leg or the other, pain in the back of your leg, redness/warmth in one of your legs, please call the office or go to the Emergency Room to have a doppler to rule out a blood clot. For shortness of breath, chest pain-seek care in the Emergency Room as soon as possible. - Actively manage your pain. Managing your pain lets you move in comfort. We will ask you to rate your pain on a scale of zero to 10. It is your responsibility to tell your doctor or nurse where and how much you hurt so your pain can be treated.  Special Considerations -If you are diabetic, you may be placed on insulin after surgery to have closer control over your blood sugars to promote healing and  recovery.  This does not mean that you will be discharged on insulin.  If applicable, your oral antidiabetics will be resumed when you are tolerating a solid diet.  -Your final pathology results from surgery should be available around one week after surgery and the results will be relayed to you when available.  -Dr. Olam Mill is the surgeon that assists your GYN Oncologist with surgery.  If you end up staying the night, the next day after your surgery you  will either see Dr. Viktoria, Dr. Eldonna, or Dr. Olam Mill.  -FMLA forms can be faxed to [phone] and please allow 5-7 business days for completion.  Pain Management After Surgery -You will be prescribed your pain medication and bowel regimen medications before surgery so that you can have these available when you are discharged from the hospital. The pain medication is for use ONLY AFTER surgery and a new prescription will not be given.   -Make sure that you have Tylenol  and Ibuprofen  IF YOU ARE ABLE TO TAKE THESE MEDICATIONS at home to use on a regular basis after surgery for pain control. We recommend alternating the medications every hour to six hours since they work differently and are processed in the body differently for pain relief.  -Review the attached handout on narcotic use and their risks and side effects.   Bowel Regimen -You will be prescribed Sennakot-S to take nightly to prevent constipation especially if you are taking the narcotic pain medication intermittently.  It is important to prevent constipation and drink adequate amounts of liquids. You can stop taking this medication when you are not taking pain medication and you are back on your normal bowel routine.  Risks of Surgery Risks of surgery are low but include bleeding, infection, damage to surrounding structures, re-operation, blood clots, and very rarely death.   Blood Transfusion Information (For the consent to be signed before surgery)  We will be checking your blood type before surgery so in case of emergencies, we will know what type of blood you would need.                                            WHAT IS A BLOOD TRANSFUSION?  A transfusion is the replacement of blood or some of its parts. Blood is made up of multiple cells which provide different functions. Red blood cells carry oxygen and are used for blood loss replacement. White blood cells fight against infection. Platelets control  bleeding. Plasma helps clot blood. Other blood products are available for specialized needs, such as hemophilia or other clotting disorders. BEFORE THE TRANSFUSION  Who gives blood for transfusions?  You may be able to donate blood to be used at a later date on yourself (autologous donation). Relatives can be asked to donate blood. This is generally not any safer than if you have received blood from a stranger. The same precautions are taken to ensure safety when a relative's blood is donated. Healthy volunteers who are fully evaluated to make sure their blood is safe. This is blood bank blood. Transfusion therapy is the safest it has ever been in the practice of medicine. Before blood is taken from a donor, a complete history is taken to make sure that person has no history of diseases nor engages in risky social behavior (examples are intravenous drug use or sexual activity with multiple partners). The  donor's travel history is screened to minimize risk of transmitting infections, such as malaria. The donated blood is tested for signs of infectious diseases, such as HIV and hepatitis. The blood is then tested to be sure it is compatible with you in order to minimize the chance of a transfusion reaction. If you or a relative donates blood, this is often done in anticipation of surgery and is not appropriate for emergency situations. It takes many days to process the donated blood. RISKS AND COMPLICATIONS Although transfusion therapy is very safe and saves many lives, the main dangers of transfusion include:  Getting an infectious disease. Developing a transfusion reaction. This is an allergic reaction to something in the blood you were given. Every precaution is taken to prevent this. The decision to have a blood transfusion has been considered carefully by your caregiver before blood is given. Blood is not given unless the benefits outweigh the risks.  AFTER SURGERY INSTRUCTIONS  Return to work:  4-6 weeks if applicable  Activity: 1. Be up and out of the bed during the day.  Take a nap if needed.  You may walk up steps but be careful and use the hand rail.  Stair climbing will tire you more than you think, you may need to stop part way and rest.   2. No lifting or straining for 6 weeks over 10 pounds. No pushing, pulling, straining for 6 weeks.  3. No driving for 4-89 days when the following criteria have been met: Do not drive if you are taking narcotic pain medicine and make sure that your reaction time has returned.   4. You can shower as soon as the next day after surgery. Shower daily.  Use your regular soap and water  (not directly on the incision) and pat your incision(s) dry afterwards; don't rub.  No tub baths or submerging your body in water  until cleared by your surgeon. If you have the soap that was given to you by pre-surgical testing that was used before surgery, you do not need to use it afterwards because this can irritate your incisions.   5. No sexual activity and nothing in the vagina for 12 weeks.  6. You may experience a small amount of clear drainage from your incisions, which is normal.  If the drainage persists, increases, or changes color please call the office.  7. Do not use creams, lotions, or ointments such as neosporin on your incisions after surgery until advised by your surgeon because they can cause removal of the dermabond glue on your incisions.    8. You may experience vaginal spotting after surgery or when the stitches at the top of the vagina begin to dissolve.  The spotting is normal but if you experience heavy bleeding, call our office.  9. Take Tylenol  or ibuprofen  first for pain if you are able to take these medications and only use narcotic pain medication for severe pain not relieved by the Tylenol  or Ibuprofen .  Monitor your Tylenol  intake to a max of 4,000 mg in a 24 hour period. You can alternate these medications after surgery.  Diet: 1.  Low sodium Heart Healthy Diet is recommended but you are cleared to resume your normal (before surgery) diet after your procedure.  2. It is safe to use a laxative, such as Miralax or Colace, if you have difficulty moving your bowels before surgery. You have been prescribed Sennakot-S to take at bedtime every evening after surgery to keep bowel movements regular and to  prevent constipation.    Wound Care: 1. Keep clean and dry.  Shower daily.  Reasons to call the Doctor: Fever - Oral temperature greater than 100.4 degrees Fahrenheit Foul-smelling vaginal discharge Difficulty urinating Nausea and vomiting Increased pain at the site of the incision that is unrelieved with pain medicine. Difficulty breathing with or without chest pain New calf pain especially if only on one side Sudden, continuing increased vaginal bleeding with or without clots.   Contacts: For questions or concerns you should contact:  Dr. Hoy Masters at [phone]  Eleanor Epps, NP at [phone]  After Hours: call [phone] and have the GYN Oncologist paged/contacted (after 5 pm or on the weekends). You will speak with an after hours RN and let he or she know you have had surgery.  Messages sent via mychart are for non-urgent matters and are not responded to after hours so for urgent needs, please call the after hours number.

## 2023-12-09 NOTE — Progress Notes (Signed)
 Patient here for a consult with Dr. Eldonna and for a pre-operative appointment prior to her scheduled surgery on 01/07/2024. She is scheduled for a robotic assisted total laparoscopic hysterectomy, bilateral salpingo-oophorectomy, sentinel lymph node biopsy, possible lymph node dissection, possible laparotomy. The surgery was discussed in detail.  See after visit summary for additional details.   Discussed post-op pain management in detail including the aspects of the enhanced recovery pathway.  Advised her that a new prescription would be sent in and it is only to be used for after her upcoming surgery.  We discussed the use of tylenol  post-op and to monitor for a maximum of 4,000 mg in a 24 hour period.  Also prescribed sennakot to be used after surgery and to hold if having loose stools.  Discussed bowel regimen in detail.     Discussed the use of SCDs and measures to take at home to prevent DVT including frequent mobility.  Reportable signs and symptoms of DVT discussed. Post-operative instructions discussed and expectations for after surgery. Incisional care discussed as well including reportable signs and symptoms including erythema, drainage, wound separation.     30 minutes spent with the patient.  Verbalizing understanding of material discussed. No needs or concerns voiced at the end of the visit.   Advised patient to call for any needs.  Advised that her post-operative medications had been prescribed and could be picked up at any time.    This appointment is included in the global surgical bundle as pre-operative teaching and has no charge.

## 2023-12-09 NOTE — H&P (View-Only) (Signed)
 GYNECOLOGIC ONCOLOGY NEW PATIENT CONSULTATION  Date of Service: 12/09/2023 Referring Provider: Jennifer Ozan, DO   ASSESSMENT AND PLAN: Shannon Gallegos is a 73 y.o. 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  We reviewed the nature of endometrial cancer and its recommended surgical staging, including total hysterectomy, bilateral salpingo-oophorectomy, and lymph node assessment. The patient is a suitable candidate for staging via a minimally invasive approach to surgery.  We reviewed that robotic assistance would be used to complete the surgery.  We discussed that most endometrial cancer is detected early and that decisions regarding adjuvant therapy will be made based on her final pathology.   We reviewed the sentinel lymph node technique. Risks and benefits of sentinel lymph node biopsy was reviewed. We reviewed the technique and ICG dye. The patient DOES NOT have an iodine allergy or known liver dysfunction. We reviewed the false negative rate (0.4%), and that 3% of patients with metastatic disease will not have it detected by SLN biopsy in endometrial cancer. A low risk of allergic reaction to the dye, <0.2% for ICG, has been reported. We also discussed that in the case of failed mapping, which occurs 40% of the time, a bilateral or unilateral lymphadenectomy will be performed at the surgeon's discretion.   Potential benefits of sentinel nodes including a higher detection rate for metastasis due to ultrastaging and potential reduction in operative morbidity. However, there remains uncertainty as to the role for treatment of micrometastatic disease. Further, the benefit of operative morbidity associated with the SLN technique in endometrial cancer is not yet completely known. In other patient populations (e.g. the cervical cancer population) there has been observed reductions in morbidity with SLN biopsy compared to pelvic lymphadenectomy. Lymphedema,  nerve dysfunction and lymphocysts are all potential risks with the SLN technique as with complete lymphadenectomy. Additional risks to the patient include the risk of damage to an internal organ while operating in an altered view (e.g. the black and white image of the robotic fluorescence imaging mode).   Patient was consented for: Robotic assisted total laparoscopic hysterectomy, bilateral salpingo-oophorectomy, sentinel lymph node evaluation and biopsy, possible lymph node dissection on 01/07/24.  The risks of surgery were discussed in detail and she understands these to including but not limited to bleeding requiring a blood transfusion, infection, injury to adjacent organs (including but not limited to the bowels, bladder, ureters, nerves, blood vessels), thromboembolic events, wound separation, hernia, vaginal cuff separation, possible risk of lymphedema and lymphocyst if lymphadenectomy performed, unforseen complication, possible need for re-exploration, and medical complications such as heart attack, stroke, pneumonia.  If the patient experiences any of these events, she understands that her hospitalization or recovery may be prolonged and that she may need to take additional medications for a prolonged period. The patient will receive DVT and antibiotic prophylaxis as indicated. She voiced a clear understanding. She had the opportunity to ask questions and informed consent was obtained today. She wishes to proceed.  She does not require preoperative clearance. Her METs are >4.  All preoperative instructions were reviewed. Postoperative expectations were also reviewed. Written handouts were provided to the patient.   A copy of this note was sent to the patient's referring provider.  Hoy Masters, MD Gynecologic Oncology   Medical Decision Making I personally spent  TOTAL 55 minutes face-to-face and non-face-to-face in the care of this patient, which includes all pre, intra, and post  visit time on the date of service.   ------------  CC: Endometrial cancer  HISTORY OF PRESENT ILLNESS:  Shannon Gallegos is a 73 y.o. woman who is seen in consultation at the request of Delon Prude, DO for evaluation of endometrial cancer.  Patient presented to her OB/GYN for evaluation of postmenopausal bleeding.  She noted that this started in August, on and off for about 2 weeks.  An endometrial biopsy was performed which returned with well-differentiated endometrioid adenocarcinoma, FIGO grade 1 arising within a background of extensive EIN.  A pelvic ultrasound was subsequently completed on/12/25 which noted a uterus measuring 8.1 x 8.2 x 7.4 cm with a fibroid measuring 7.4 x 6.2 x 5.3 cm.  Endometrium not well-visualized due to the large fibroid.  Today patient presents with her husband.  Endorses a history as above.    PAST MEDICAL HISTORY: Past Medical History:  Diagnosis Date   Arthritis    Borderline diabetes    GERD (gastroesophageal reflux disease)    Hypercholesterolemia    Hypertension    Vertigo     PAST SURGICAL HISTORY: Past Surgical History:  Procedure Laterality Date   BREAST BIOPSY Left 2000   neg   BREAST BIOPSY Right 01/19/2020   us  bx of LN, neg   CATARACT EXTRACTION Bilateral    COLONOSCOPY  05/2012   Dr. Angelena: sigmoid and descending colon diverticula, ADENOMATOUS polyp. Due for surveillance 2019   COLONOSCOPY WITH PROPOFOL  N/A 02/05/2022   Procedure: COLONOSCOPY WITH PROPOFOL ;  Surgeon: Onita Elspeth Sharper, DO;  Location: [facility];  Service: Gastroenterology;  Laterality: N/A;   ESOPHAGOGASTRODUODENOSCOPY (EGD) WITH ESOPHAGEAL DILATION N/A 12/09/2012   Dr. Harvey: small hiatal hernia, Schatzki's ring at GE junction s/p Savary dilation, benign sessile polyps, moderate gastritis   KNEE ARTHROSCOPY WITH MEDIAL MENISECTOMY Left 04/28/2015   Procedure: LEFT KNEE ARTHROSCOPY WITH PARTIAL MEDIAL MENISECTOMY;  Surgeon: Taft FORBES Minerva, MD;   Location: [facility];  Service: Orthopedics;  Laterality: Left;   KNEE SURGERY Bilateral    arthroscopies   TUBAL LIGATION      OB/GYN HISTORY: OB History  Gravida Para Term Preterm AB Living  3 3 3      SAB IAB Ectopic Multiple Live Births          # Outcome Date GA Lbr Len/2nd Weight Sex Type Anes PTL Lv  3 Term 39    M Vag-Spont     2 Term 64    M Vag-Spont     1 Term 2    F Vag-Spont         Age at menarche: 30 Age at menopause: 55 Hx of HRT: no Hx of STI: no Last pap: 2024 per pt report History of abnormal pap smears: no  SCREENING STUDIES:  Last mammogram: 2025 Last colonoscopy: 01/2022  MEDICATIONS:  Current Outpatient Medications:    atorvastatin (LIPITOR) 40 MG tablet, Take 40 mg by mouth daily., Disp: , Rfl:    Cholecalciferol (VITAMIN D3) 25 MCG (1000 UT) CAPS, Take by mouth., Disp: , Rfl:    diclofenac  (VOLTAREN ) 75 MG EC tablet, Take 1 tablet (75 mg total) by mouth 2 (two) times daily. (Patient taking differently: Take 75 mg by mouth. As needed), Disp: 30 tablet, Rfl: 0   DULoxetine (CYMBALTA) 30 MG capsule, Take 30 mg by mouth daily. (Patient taking differently: Take 30 mg by mouth daily. PRN), Disp: , Rfl:    furosemide (LASIX) 20 MG tablet, , Disp: , Rfl: 0   hydrochlorothiazide (HYDRODIURIL) 25 MG tablet, Take 25 mg by mouth daily., Disp: ,  Rfl:    meclizine (ANTIVERT) 25 MG tablet, Take 25 mg by mouth 3 (three) times daily as needed for dizziness. , Disp: , Rfl:    omeprazole (PRILOSEC) 40 MG capsule, , Disp: , Rfl: 1   potassium chloride  SA (K-DUR,KLOR-CON ) 20 MEQ tablet, Take 1 tablet (20 mEq total) by mouth daily., Disp: 30 tablet, Rfl: 5   clobetasol ointment (TEMOVATE) 0.05 %, Apply topically 2 (two) times daily., Disp: , Rfl:   ALLERGIES: Allergies  Allergen Reactions   Sulfamethoxazole-Trimethoprim Other (See Comments)    Mouth sores   Trazodone Other (See Comments)    drowsy    FAMILY HISTORY: Family History  Problem Relation Age of  Onset   Cirrhosis Father        alcoholic   Lung cancer Father    Lung cancer Sister    Cancer Brother        unknown type   Colon cancer Neg Hx    Breast cancer Neg Hx    Ovarian cancer Neg Hx    Endometrial cancer Neg Hx     SOCIAL HISTORY: Social History   Socioeconomic History   Marital status: Married    Spouse name: Not on file   Number of children: Not on file   Years of education: Not on file   Highest education level: Not on file  Occupational History   Occupation: retired  Tobacco Use   Smoking status: Never   Smokeless tobacco: Never  Vaping Use   Vaping status: Never Used  Substance and Sexual Activity   Alcohol use: No   Drug use: No   Sexual activity: Not Currently    Birth control/protection: Post-menopausal  Other Topics Concern   Not on file  Social History Narrative   Not on file   Social Drivers of Health   Financial Resource Strain: Low Risk  (12/02/2023)   Overall Financial Resource Strain (CARDIA)    Difficulty of Paying Living Expenses: Not very hard  Food Insecurity: No Food Insecurity (12/02/2023)   Hunger Vital Sign    Worried About Running Out of Food in the Last Year: Never true    Ran Out of Food in the Last Year: Never true  Transportation Needs: No Transportation Needs (12/02/2023)   PRAPARE - Administrator, Civil Service (Medical): No    Lack of Transportation (Non-Medical): No  Physical Activity: Inactive (12/02/2023)   Exercise Vital Sign    Days of Exercise per Week: 0 days    Minutes of Exercise per Session: 10 min  Stress: Stress Concern Present (12/02/2023)   [employer] of Occupational Health - Occupational Stress Questionnaire    Feeling of Stress: To some extent  Social Connections: Socially Integrated (12/02/2023)   Social Connection and Isolation Panel    Frequency of Communication with Friends and Family: More than three times a week    Frequency of Social Gatherings with Friends and Family: Three times a  week    Attends Religious Services: More than 4 times per year    Active Member of Clubs or Organizations: Yes    Attends Banker Meetings: More than 4 times per year    Marital Status: Married  Catering manager Violence: Not At Risk (12/02/2023)   Humiliation, Afraid, Rape, and Kick questionnaire    Fear of Current or Ex-Partner: No    Emotionally Abused: No    Physically Abused: No    Sexually Abused: No    REVIEW OF SYSTEMS:  New patient intake form was reviewed.  Complete 10-system review is negative except for the following: Anxiety, vaginal bleeding  PHYSICAL EXAM: BP 137/73 (BP Location: Right Arm, Patient Position: Sitting)   Pulse 93   Temp 98.4 F (36.9 C) (Oral)   Resp 20   Ht 5' 2 (1.575 m)   Wt 201 lb 9.6 oz (91.4 kg)   SpO2 100%   BMI 36.87 kg/m  Constitutional: No acute distress. Neuro/Psych: Alert, oriented.  Head and Neck: Normocephalic, atraumatic. Neck symmetric without masses. Sclera anicteric.  Respiratory: Normal work of breathing. Clear to auscultation bilaterally. Cardiovascular: Regular rate and rhythm, no murmurs, rubs, or gallops. Abdomen: Normoactive bowel sounds. Soft, non-distended, non-tender to palpation. No masses appreciated. Well healed umbilical incision. Extremities: Grossly normal range of motion. Warm, well perfused. Non pitting edema in bilateral ankles. Skin: No rashes or lesions. Lymphatic: No cervical, supraclavicular, or inguinal adenopathy. Genitourinary: External genitalia without lesions. Urethral meatus without lesions or prolapse. On speculum exam, vagina and cervix without lesions. Bimanual exam reveals retroverted uterus with fundal fibroid, overall mildly enlarged uterus, mobile. Exam chaperoned by Eleanor Epps, NP   LABORATORY AND RADIOLOGIC DATA: Outside medical records were reviewed to synthesize the above history, along with the history and physical obtained during the visit.  Outside laboratory, pathology,  and imaging reports were reviewed, with pertinent results below.  I personally reviewed the outside images.  WBC  Date Value Ref Range Status  04/26/2015 5.7 4.0 - 10.5 K/uL Final   Hemoglobin  Date Value Ref Range Status  04/28/2015 13.9 12.0 - 15.0 g/dL Final   HCT  Date Value Ref Range Status  04/28/2015 41.0 36.0 - 46.0 % Final   Platelets  Date Value Ref Range Status  04/26/2015 189 150 - 400 K/uL Final   Creatinine, Ser  Date Value Ref Range Status  04/26/2015 0.82 0.44 - 1.00 mg/dL Final    Surgical pathology (12/02/23): A. ENDOMETRIUM, BIOPSY:  Well-differentiated endometrioid adenocarcinoma, FIGO I  Tumor arises within a background of extensive complex atypical  hyperplasia (EIN) and acute inflammation   COMMENT:   Given the focality of the adenocarcinoma within the extensive background  EIN/complex atypical hyperplasia and the degree of acute inflammation,  immunohistochemical staining for p53 and MMR will be deferred to a  hysterectomy specimen.    US  PELVIC COMPLETE WITH TRANSVAGINAL 12/06/2023  Narrative CLINICAL DATA:  Postmenopausal bleeding for 3 days.  EXAM: TRANSABDOMINAL AND TRANSVAGINAL ULTRASOUND OF PELVIS  TECHNIQUE: Both transabdominal and transvaginal ultrasound examinations of the pelvis were performed. Transabdominal technique was performed for global imaging of the pelvis including uterus, ovaries, adnexal regions, and pelvic cul-de-sac. It was necessary to proceed with endovaginal exam following the transabdominal exam to visualize the uterus and adnexal regions.  COMPARISON:  None Available.  FINDINGS: Uterus  Measurements: 8.1 x 8.2 x 7.4 cm = volume: 262 mL. Fibroid measuring 7.4 x 6.2 x 5.3 cm is noted.  Endometrium  Not well visualized due to large fibroid.  Right ovary  Not visualized.  Left ovary  Not visualized.  Other findings  No abnormal free fluid.  IMPRESSION: 7.4 cm uterine fibroid is noted.  Endometrium is not well visualized due to large fibroid. Ovaries are not visualized.   Electronically Signed By: Lynwood Landy Raddle M.D. On: 12/06/2023 16:24

## 2023-12-13 ENCOUNTER — Ambulatory Visit: Admitting: Gynecologic Oncology

## 2023-12-17 ENCOUNTER — Inpatient Hospital Stay: Admitting: Licensed Clinical Social Worker

## 2023-12-17 NOTE — Progress Notes (Signed)
 [facility] Clinical Social Work  Initial Assessment   Shannon Gallegos is a 73 y.o. year old female contacted by phone. Clinical Social Work was referred by new patient protocol for assessment of psychosocial needs.   SDOH (Social Determinants of Health) assessments performed: No   SDOH Screenings   Food Insecurity: No Food Insecurity (12/02/2023)  Housing: Low Risk  (12/02/2023)  Transportation Needs: No Transportation Needs (12/02/2023)  Utilities: Not At Risk (12/02/2023)  Alcohol Screen: Low Risk  (12/02/2023)  Financial Resource Strain: Low Risk  (12/02/2023)  Physical Activity: Inactive (12/02/2023)  Social Connections: Socially Integrated (12/02/2023)  Stress: Stress Concern Present (12/02/2023)  Tobacco Use: Low Risk  (12/09/2023)    PHQ 2/9:     No data to display           Distress Screen completed: No     No data to display            Family/Social Information:  Housing Arrangement: patient lives with her husband. Pt's sister, who she cares for, also lives with them Family members/support persons in your life? Family- husband, 3 kids, grandkids, siblings Transportation concerns: no  Employment: Retired.  Income source: Actor concerns: No Type of concern: None Food access concerns: no Religious or spiritual practice: Not known Advanced directives: Not known Services Currently in place:  [employer]  Coping/ Adjustment to diagnosis: Patient understands treatment plan and what happens next? yes, has surgery scheduled for 01/07/24. She is coping as well as possible, trying to stay positive. She has not told her family yet as she is still processing the news and wants to tell people in the order that she wants. She plans to tell her kids next week Concerns about diagnosis and/or treatment: Pt is used to being the strong one and caregiver, so is concerned about telling family or needing help Patient reported stressors: Adjusting to my  illness Patient enjoys time with family/ friends Current coping skills/ strengths: Ability for insight , Capable of independent living , Manufacturing systems engineer , Motivation for treatment/growth , and Supportive family/friends     SUMMARY: Current SDOH Barriers:  No major barriers identified today  Clinical Social Work Clinical Goal(s):  No clinical social work goals at this time  Interventions: Discussed common feeling and emotions when being diagnosed with cancer, and the importance of support during treatment Informed patient of the support team roles and support services at [facility] Provided CSW contact information and encouraged patient to call with any questions or concerns   Follow Up Plan: Patient will contact CSW with any support or resource needs and CSW will follow-up with patient by phone after surgery Patient verbalizes understanding of plan: Yes    Jakia Kennebrew E Lucylle Foulkes, LCSW Clinical Social Worker [facility]

## 2023-12-30 NOTE — Progress Notes (Signed)
 Anesthesia Review:  PCP: Cardiologist :  PPM/ ICD: Device Orders: Rep Notified:  Chest x-ray : EKG : Echo : Stress test: Cardiac Cath :   Activity level:  Sleep Study/ CPAP : Fasting Blood Sugar :      / Checks Blood Sugar -- times a day:    Blood Thinner/ Instructions /Last Dose: ASA / Instructions/ Last Dose :

## 2023-12-31 NOTE — Patient Instructions (Addendum)
 SURGICAL WAITING ROOM VISITATION  Patients having surgery or a procedure may have no more than 2 support people in the waiting area - these visitors may rotate.    Children under the age of 65 must have an adult with them who is not the patient.  Visitors with respiratory illnesses are discouraged from visiting and should remain at home.  If the patient needs to stay at the hospital during part of their recovery, the visitor guidelines for inpatient rooms apply. Pre-op nurse will coordinate an appropriate time for 1 support person to accompany patient in pre-op.  This support person may not rotate.    Please refer to the [facility] website for the visitor guidelines for Inpatients (after your surgery is over and you are in a regular room).       Your procedure is scheduled on: 01/07/2024    Report to [facility] Main Entrance    Report to admitting at    200 pm    Call this number if you have problems the morning of surgery [phone]   Do not eat food :After Midnight.         Eat a light diet the day before surgery,  Avoid gas producing foods and carbonated beverages.    After Midnight you may have the following liquids until __ 100 pm ____ AM  DAY OF SURGERY  Water  Non-Citrus Juices (without pulp, NO RED-Apple, White grape, White cranberry) Black Coffee (NO MILK/CREAM OR CREAMERS, sugar ok)  Clear Tea (NO MILK/CREAM OR CREAMERS, sugar ok) regular and decaf                             Plain Jell-O (NO RED)                                           Fruit ices (not with fruit pulp, NO RED)                                     Popsicles (NO RED)                                                               Sports drinks like Gatorade (NO RED)                            If you have questions, please contact your surgeon's office.       Oral Hygiene is also important to reduce your risk of infection.                                    Remember - BRUSH YOUR TEETH  THE MORNING OF SURGERY WITH YOUR REGULAR TOOTHPASTE  DENTURES WILL BE REMOVED PRIOR TO SURGERY PLEASE DO NOT APPLY Poly grip OR ADHESIVES!!!   Do NOT smoke after Midnight   Stop all vitamins and herbal supplements 7 days before surgery.   Take these medicines the  morning of surgery with A SIP OF WATER :  cymbalta if needed, omeprazole   DO NOT TAKE ANY ORAL DIABETIC MEDICATIONS DAY OF YOUR SURGERY  Bring CPAP mask and tubing day of surgery.                              You may not have any metal on your body including hair pins, jewelry, and body piercing             Do not wear make-up, lotions, powders, perfumes/cologne, or deodorant  Do not wear nail polish including gel and S&S, artificial/acrylic nails, or any other type of covering on natural nails including finger and toenails. If you have artificial nails, gel coating, etc. that needs to be removed by a nail salon please have this removed prior to surgery or surgery may need to be canceled/ delayed if the surgeon/ anesthesia feels like they are unable to be safely monitored.   Do not shave  48 hours prior to surgery.               Men may shave face and neck.   Do not bring valuables to the hospital. [facility] IS NOT             RESPONSIBLE   FOR VALUABLES.   Contacts, glasses, dentures or bridgework may not be worn into surgery.   Bring small overnight bag day of surgery.   DO NOT BRING YOUR HOME MEDICATIONS TO THE HOSPITAL. PHARMACY WILL DISPENSE MEDICATIONS LISTED ON YOUR MEDICATION LIST TO YOU DURING YOUR ADMISSION IN THE HOSPITAL!    Patients discharged on the day of surgery will not be allowed to drive home.  Someone NEEDS to stay with you for the first 24 hours after anesthesia.   Special Instructions: Bring a copy of your healthcare power of attorney and living will documents the day of surgery if you haven't scanned them before.              Please read over the following fact sheets you were given: IF YOU  HAVE QUESTIONS ABOUT YOUR PRE-OP INSTRUCTIONS PLEASE CALL [phone].   If you received a COVID test during your pre-op visit  it is requested that you wear a mask when out in public, stay away from anyone that may not be feeling well and notify your surgeon if you develop symptoms. If you test positive for Covid or have been in contact with anyone that has tested positive in the last 10 days please notify you surgeon.    [facility] - Preparing for Surgery Before surgery, you can play an important role.  Because skin is not sterile, your skin needs to be as free of germs as possible.  You can reduce the number of germs on your skin by washing with CHG (chlorahexidine gluconate) soap before surgery.  CHG is an antiseptic cleaner which kills germs and bonds with the skin to continue killing germs even after washing. Please DO NOT use if you have an allergy to CHG or antibacterial soaps.  If your skin becomes reddened/irritated stop using the CHG and inform your nurse when you arrive at Short Stay. Do not shave (including legs and underarms) for at least 48 hours prior to the first CHG shower.  You may shave your face/neck. Please follow these instructions carefully:  1.  Shower with CHG Soap the night before surgery and the  morning of Surgery.  2.  If you choose to wash your hair, wash your hair first as usual with your  normal  shampoo.  3.  After you shampoo, rinse your hair and body thoroughly to remove the  shampoo.                           4.  Use CHG as you would any other liquid soap.  You can apply chg directly  to the skin and wash                       Gently with a scrungie or clean washcloth.  5.  Apply the CHG Soap to your body ONLY FROM THE NECK DOWN.   Do not use on face/ open                           Wound or open sores. Avoid contact with eyes, ears mouth and genitals (private parts).                       Wash face,  Genitals (private parts) with your normal soap.             6.   Wash thoroughly, paying special attention to the area where your surgery  will be performed.  7.  Thoroughly rinse your body with warm water  from the neck down.  8.  DO NOT shower/wash with your normal soap after using and rinsing off  the CHG Soap.                9.  Pat yourself dry with a clean towel.            10.  Wear clean pajamas.            11.  Place clean sheets on your bed the night of your first shower and do not  sleep with pets. Day of Surgery : Do not apply any lotions/deodorants the morning of surgery.  Please wear clean clothes to the hospital/surgery center.  FAILURE TO FOLLOW THESE INSTRUCTIONS MAY RESULT IN THE CANCELLATION OF YOUR SURGERY PATIENT SIGNATURE_________________________________  NURSE SIGNATURE__________________________________  ________________________________________________________________________

## 2024-01-01 ENCOUNTER — Other Ambulatory Visit: Payer: Self-pay

## 2024-01-01 ENCOUNTER — Encounter (HOSPITAL_COMMUNITY): Payer: Self-pay

## 2024-01-01 ENCOUNTER — Encounter (HOSPITAL_COMMUNITY)
Admission: RE | Admit: 2024-01-01 | Discharge: 2024-01-01 | Disposition: A | Discharge status: Home / Self Care | Source: Ambulatory Visit | Attending: Psychiatry | Admitting: Psychiatry

## 2024-01-01 VITALS — BP 116/81 | HR 96 | Temp 98.5°F | Resp 16 | Ht 62.0 in | Wt 196.0 lb

## 2024-01-01 DIAGNOSIS — Z01818 Encounter for other preprocedural examination: Secondary | ICD-10-CM | POA: Insufficient documentation

## 2024-01-01 DIAGNOSIS — C541 Malignant neoplasm of endometrium: Secondary | ICD-10-CM | POA: Diagnosis not present

## 2024-01-01 LAB — CBC
HCT: 42.6 % (ref 36.0–46.0)
Hemoglobin: 13.6 g/dL (ref 12.0–15.0)
MCH: 29.7 pg (ref 26.0–34.0)
MCHC: 31.9 g/dL (ref 30.0–36.0)
MCV: 93 fL (ref 80.0–100.0)
Platelets: 228 K/uL (ref 150–400)
RBC: 4.58 MIL/uL (ref 3.87–5.11)
RDW: 13.2 % (ref 11.5–15.5)
WBC: 7.2 K/uL (ref 4.0–10.5)
nRBC: 0 % (ref 0.0–0.2)

## 2024-01-01 LAB — COMPREHENSIVE METABOLIC PANEL WITH GFR
ALT: 13 U/L (ref 0–44)
AST: 20 U/L (ref 15–41)
Albumin: 4.3 g/dL (ref 3.5–5.0)
Alkaline Phosphatase: 116 U/L (ref 38–126)
Anion gap: 9 (ref 5–15)
BUN: 14 mg/dL (ref 8–23)
CO2: 32 mmol/L (ref 22–32)
Calcium: 10.4 mg/dL — ABNORMAL HIGH (ref 8.9–10.3)
Chloride: 99 mmol/L (ref 98–111)
Creatinine, Ser: 0.96 mg/dL (ref 0.44–1.00)
GFR, Estimated: >60 mL/min (ref >60)
Glucose, Bld: 98 mg/dL (ref 70–99)
Potassium: 3.8 mmol/L (ref 3.5–5.1)
Sodium: 141 mmol/L (ref 135–145)
Total Bilirubin: 1.1 mg/dL (ref 0.0–1.2)
Total Protein: 6.8 g/dL (ref 6.5–8.1)

## 2024-01-02 ENCOUNTER — Ambulatory Visit: Payer: Self-pay

## 2024-01-02 NOTE — Telephone Encounter (Signed)
-----   Message from Eleanor JONETTA Epps sent at 01/02/2024  7:04 AM EDT ----- Please fax results to PCP ----- Message ----- From: Rebecka, Lab In [place] Sent: 01/01/2024   3:39 PM EDT To: Eleanor JONETTA Epps, NP

## 2024-01-02 NOTE — Telephone Encounter (Signed)
 Per Eleanor Epps NP, I faxed recent lab results to Ms.Umbarger PCP.

## 2024-01-06 ENCOUNTER — Telehealth: Payer: Self-pay | Admitting: *Deleted

## 2024-01-06 NOTE — Telephone Encounter (Signed)
Telephone call to check on pre-operative status.  Patient compliant with pre-operative instructions.  Reinforced nothing to eat after midnight. Clear liquids until 1245. Patient to arrive at 1345.  No questions or concerns voiced.  Instructed to call for any needs.

## 2024-01-07 ENCOUNTER — Other Ambulatory Visit: Payer: Self-pay

## 2024-01-07 ENCOUNTER — Encounter (HOSPITAL_COMMUNITY): Payer: Self-pay | Admitting: Psychiatry

## 2024-01-07 ENCOUNTER — Ambulatory Visit (HOSPITAL_COMMUNITY)
Admission: RE | Admit: 2024-01-07 | Discharge: 2024-01-07 | Disposition: A | Discharge status: Home / Self Care | Attending: Psychiatry | Admitting: Psychiatry

## 2024-01-07 ENCOUNTER — Ambulatory Visit (HOSPITAL_BASED_OUTPATIENT_CLINIC_OR_DEPARTMENT_OTHER): Payer: Self-pay

## 2024-01-07 ENCOUNTER — Encounter (HOSPITAL_COMMUNITY)
Admission: RE | Disposition: A | Discharge status: Home / Self Care | Payer: Self-pay | Source: Home / Self Care | Attending: Psychiatry

## 2024-01-07 ENCOUNTER — Ambulatory Visit (HOSPITAL_COMMUNITY): Payer: Self-pay | Admitting: Medical

## 2024-01-07 DIAGNOSIS — C541 Malignant neoplasm of endometrium: Secondary | ICD-10-CM

## 2024-01-07 DIAGNOSIS — D251 Intramural leiomyoma of uterus: Secondary | ICD-10-CM | POA: Diagnosis not present

## 2024-01-07 DIAGNOSIS — N72 Inflammatory disease of cervix uteri: Secondary | ICD-10-CM

## 2024-01-07 DIAGNOSIS — N879 Dysplasia of cervix uteri, unspecified: Secondary | ICD-10-CM | POA: Diagnosis not present

## 2024-01-07 DIAGNOSIS — Z01818 Encounter for other preprocedural examination: Secondary | ICD-10-CM

## 2024-01-07 HISTORY — PX: ROBOTIC ASSISTED TOTAL HYSTERECTOMY WITH BILATERAL SALPINGO OOPHERECTOMY: SHX6086

## 2024-01-07 HISTORY — PX: LYMPH NODE BIOPSY: SHX201

## 2024-01-07 HISTORY — PX: INJECTION, FOR SENTINEL LYMPH NODE IDENTIFICATION: SHX7598

## 2024-01-07 LAB — TYPE AND SCREEN
ABO/RH(D): A POS
Antibody Screen: NEGATIVE

## 2024-01-07 LAB — ABO/RH: ABO/RH(D): A POS

## 2024-01-07 SURGERY — HYSTERECTOMY, TOTAL, ROBOT-ASSISTED, LAPAROSCOPIC, WITH BILATERAL SALPINGO-OOPHORECTOMY
Anesthesia: General | Laterality: Bilateral

## 2024-01-07 MED ORDER — STERILE WATER FOR INJECTION IJ SOLN
INTRAMUSCULAR | Status: AC
  Filled 2024-01-07: qty 10

## 2024-01-07 MED ORDER — CEFAZOLIN SODIUM-DEXTROSE 2-4 GM/100ML-% IV SOLN
INTRAVENOUS | Status: AC
  Filled 2024-01-07: qty 100

## 2024-01-07 MED ORDER — ROCURONIUM BROMIDE 100 MG/10ML IV SOLN
INTRAVENOUS | Status: DC | PRN
  Administered 2024-01-07: 10 mg via INTRAVENOUS
  Administered 2024-01-07: 50 mg via INTRAVENOUS

## 2024-01-07 MED ORDER — PHENYLEPHRINE 80 MCG/ML (10ML) SYRINGE FOR IV PUSH (FOR BLOOD PRESSURE SUPPORT)
PREFILLED_SYRINGE | INTRAVENOUS | Status: DC | PRN
  Administered 2024-01-07 (×3): 80 ug via INTRAVENOUS

## 2024-01-07 MED ORDER — EPHEDRINE SULFATE-NACL 50-0.9 MG/10ML-% IV SOSY
PREFILLED_SYRINGE | INTRAVENOUS | Status: DC | PRN
  Administered 2024-01-07 (×3): 5 mg via INTRAVENOUS

## 2024-01-07 MED ORDER — ONDANSETRON HCL 4 MG/2ML IJ SOLN
INTRAMUSCULAR | Status: AC
  Filled 2024-01-07: qty 2

## 2024-01-07 MED ORDER — SUGAMMADEX SODIUM 200 MG/2ML IV SOLN
INTRAVENOUS | Status: DC | PRN
  Administered 2024-01-07: 200 mg via INTRAVENOUS

## 2024-01-07 MED ORDER — ACETAMINOPHEN 500 MG PO TABS
1000.0000 mg | ORAL_TABLET | ORAL | Status: AC
  Administered 2024-01-07: 1000 mg via ORAL

## 2024-01-07 MED ORDER — PROPOFOL 10 MG/ML IV BOLUS
INTRAVENOUS | Status: DC | PRN
Start: 2024-01-07 — End: 2024-01-07
  Administered 2024-01-07: 150 mg via INTRAVENOUS

## 2024-01-07 MED ORDER — METRONIDAZOLE 500 MG/100ML IV SOLN
INTRAVENOUS | Status: AC
  Filled 2024-01-07: qty 100

## 2024-01-07 MED ORDER — FENTANYL CITRATE (PF) 250 MCG/5ML IJ SOLN
INTRAMUSCULAR | Status: AC
  Filled 2024-01-07: qty 5

## 2024-01-07 MED ORDER — SUGAMMADEX SODIUM 200 MG/2ML IV SOLN
INTRAVENOUS | Status: AC
  Filled 2024-01-07: qty 2

## 2024-01-07 MED ORDER — CHLORHEXIDINE GLUCONATE 0.12 % MT SOLN
15.0000 mL | Freq: Once | OROMUCOSAL | Status: AC
  Administered 2024-01-07: 15 mL via OROMUCOSAL

## 2024-01-07 MED ORDER — PROPOFOL 10 MG/ML IV BOLUS
INTRAVENOUS | Status: AC
  Filled 2024-01-07: qty 20

## 2024-01-07 MED ORDER — ORAL CARE MOUTH RINSE: 15.0000 mL | Freq: Once | OROMUCOSAL | Status: AC

## 2024-01-07 MED ORDER — ACETAMINOPHEN 500 MG PO TABS
ORAL_TABLET | ORAL | Status: AC
  Filled 2024-01-07: qty 2

## 2024-01-07 MED ORDER — CEFAZOLIN SODIUM-DEXTROSE 2-4 GM/100ML-% IV SOLN
2.0000 g | INTRAVENOUS | Status: AC
  Administered 2024-01-07: 2 g via INTRAVENOUS

## 2024-01-07 MED ORDER — FENTANYL CITRATE (PF) 100 MCG/2ML IJ SOLN
INTRAMUSCULAR | Status: AC
  Filled 2024-01-07: qty 2

## 2024-01-07 MED ORDER — OXYCODONE HCL 5 MG PO TABS
5.0000 mg | ORAL_TABLET | Freq: Once | ORAL | Status: AC
  Administered 2024-01-07: 5 mg via ORAL

## 2024-01-07 MED ORDER — STERILE WATER FOR IRRIGATION IR SOLN
Status: DC | PRN
Start: 2024-01-07 — End: 2024-01-07
  Administered 2024-01-07: 1000 mL

## 2024-01-07 MED ORDER — ONDANSETRON HCL 4 MG/2ML IJ SOLN
INTRAMUSCULAR | Status: DC | PRN
  Administered 2024-01-07: 4 mg via INTRAVENOUS

## 2024-01-07 MED ORDER — FENTANYL CITRATE (PF) 100 MCG/2ML IJ SOLN
INTRAMUSCULAR | Status: DC | PRN
  Administered 2024-01-07: 25 ug via INTRAVENOUS
  Administered 2024-01-07: 75 ug via INTRAVENOUS

## 2024-01-07 MED ORDER — HEPARIN SODIUM (PORCINE) 5000 UNIT/ML IJ SOLN
5000.0000 [IU] | INTRAMUSCULAR | Status: AC
  Administered 2024-01-07: 5000 [IU] via SUBCUTANEOUS

## 2024-01-07 MED ORDER — MIDAZOLAM HCL 2 MG/2ML IJ SOLN
INTRAMUSCULAR | Status: AC
  Filled 2024-01-07: qty 2

## 2024-01-07 MED ORDER — HEPARIN SODIUM (PORCINE) 5000 UNIT/ML IJ SOLN
INTRAMUSCULAR | Status: AC
  Filled 2024-01-07: qty 1

## 2024-01-07 MED ORDER — DEXAMETHASONE SOD PHOSPHATE PF 10 MG/ML IJ SOLN
4.0000 mg | INTRAMUSCULAR | Status: AC
  Administered 2024-01-07: 4 mg via INTRAVENOUS

## 2024-01-07 MED ORDER — LIDOCAINE HCL (CARDIAC) PF 100 MG/5ML IV SOSY
PREFILLED_SYRINGE | INTRAVENOUS | Status: DC | PRN
  Administered 2024-01-07: 60 mg via INTRAVENOUS

## 2024-01-07 MED ORDER — METRONIDAZOLE 500 MG/100ML IV SOLN
500.0000 mg | INTRAVENOUS | Status: AC
  Administered 2024-01-07: 500 mg via INTRAVENOUS

## 2024-01-07 MED ORDER — STERILE WATER FOR INJECTION IJ SOLN
INTRAMUSCULAR | Status: DC | PRN
  Administered 2024-01-07: 10 mL via INTRAMUSCULAR

## 2024-01-07 MED ORDER — LIDOCAINE HCL (PF) 2 % IJ SOLN
INTRAMUSCULAR | Status: AC
  Filled 2024-01-07: qty 5

## 2024-01-07 MED ORDER — BUPIVACAINE HCL (PF) 0.25 % IJ SOLN
INTRAMUSCULAR | Status: AC
  Filled 2024-01-07: qty 30

## 2024-01-07 MED ORDER — LACTATED RINGERS IR SOLN
Status: DC | PRN
  Administered 2024-01-07: 1000 mL

## 2024-01-07 MED ORDER — OXYCODONE HCL 5 MG PO TABS
ORAL_TABLET | ORAL | Status: AC
  Filled 2024-01-07: qty 1

## 2024-01-07 MED ORDER — LACTATED RINGERS IV SOLN: INTRAVENOUS | Status: DC

## 2024-01-07 MED ORDER — ACETAMINOPHEN 10 MG/ML IV SOLN
1000.0000 mg | Freq: Once | INTRAVENOUS | Status: DC | PRN
Start: 2024-01-07 — End: 2024-01-08

## 2024-01-07 MED ORDER — BUPIVACAINE HCL 0.25 % IJ SOLN
INTRAMUSCULAR | Status: DC | PRN
  Administered 2024-01-07: 30 mL

## 2024-01-07 MED ORDER — FENTANYL CITRATE (PF) 50 MCG/ML IJ SOSY
25.0000 ug | PREFILLED_SYRINGE | INTRAMUSCULAR | Status: DC | PRN
  Administered 2024-01-07: 50 ug via INTRAVENOUS

## 2024-01-07 MED ORDER — FENTANYL CITRATE (PF) 50 MCG/ML IJ SOSY
PREFILLED_SYRINGE | INTRAMUSCULAR | Status: AC
  Filled 2024-01-07: qty 2

## 2024-01-07 SURGICAL SUPPLY — 67 items
APPLICATOR SURGIFLO ENDO (HEMOSTASIS) IMPLANT
BAG LAPAROSCOPIC 12 15 PORT 16 (BASKET) IMPLANT
BLADE SURG SZ10 CARB STEEL (BLADE) IMPLANT
COVER BACK TABLE 60X90IN (DRAPES) ×1 IMPLANT
COVER TIP SHEARS 8 DVNC (MISCELLANEOUS) ×1 IMPLANT
DERMABOND ADVANCED .7 DNX12 (GAUZE/BANDAGES/DRESSINGS) ×1 IMPLANT
DRAPE ARM DVNC X/XI (DISPOSABLE) ×4 IMPLANT
DRAPE COLUMN DVNC XI (DISPOSABLE) ×1 IMPLANT
DRAPE SHEET LG 3/4 BI-LAMINATE (DRAPES) ×1 IMPLANT
DRAPE SURG IRRIG POUCH 19X23 (DRAPES) ×1 IMPLANT
DRIVER NDL MEGA SUTCUT DVNCXI (INSTRUMENTS) ×1 IMPLANT
DRIVER NDLE MEGA SUTCUT DVNCXI (INSTRUMENTS) ×1 IMPLANT
DRSG OPSITE POSTOP 4X6 (GAUZE/BANDAGES/DRESSINGS) IMPLANT
DRSG OPSITE POSTOP 4X8 (GAUZE/BANDAGES/DRESSINGS) IMPLANT
ELECT PENCIL ROCKER SW 15FT (MISCELLANEOUS) IMPLANT
ELECT REM PT RETURN 15FT ADLT (MISCELLANEOUS) ×1 IMPLANT
FORCEPS BPLR FENES DVNC XI (FORCEP) ×1 IMPLANT
FORCEPS PROGRASP DVNC XI (FORCEP) ×1 IMPLANT
GAUZE 4X4 16PLY ~~LOC~~+RFID DBL (SPONGE) ×1 IMPLANT
GLOVE BIO SURGEON STRL SZ 6.5 (GLOVE) ×1 IMPLANT
GLOVE BIOGEL PI IND STRL 6.5 (GLOVE) ×2 IMPLANT
GLOVE BIOGEL PI MICRO STRL 6 (GLOVE) ×4 IMPLANT
GOWN STRL REUS W/ TWL LRG LVL3 (GOWN DISPOSABLE) ×4 IMPLANT
GRASPER SUT TROCAR 14GX15 (MISCELLANEOUS) IMPLANT
HOLDER FOLEY CATH W/STRAP (MISCELLANEOUS) IMPLANT
IRRIGATION SUCT STRKRFLW 2 WTP (MISCELLANEOUS) ×1 IMPLANT
KIT PROCEDURE DVNC SI (MISCELLANEOUS) IMPLANT
KIT TURNOVER KIT A (KITS) ×1 IMPLANT
LIGASURE IMPACT 36 18CM CVD LR (INSTRUMENTS) IMPLANT
MANIPULATOR ADVINCU DEL 3.0 PL (MISCELLANEOUS) IMPLANT
MANIPULATOR ADVINCU DEL 3.5 PL (MISCELLANEOUS) IMPLANT
MANIPULATOR UTERINE 4.5 ZUMI (MISCELLANEOUS) IMPLANT
NDL HYPO 21X1.5 SAFETY (NEEDLE) ×1 IMPLANT
NDL INSUFFLATION 14GA 120MM (NEEDLE) IMPLANT
NDL SPNL 20GX3.5 QUINCKE YW (NEEDLE) IMPLANT
NEEDLE HYPO 21X1.5 SAFETY (NEEDLE) ×1 IMPLANT
NEEDLE INSUFFLATION 14GA 120MM (NEEDLE) IMPLANT
NEEDLE SPNL 20GX3.5 QUINCKE YW (NEEDLE) IMPLANT
OBTURATOR OPTICALSTD 8 DVNC (TROCAR) ×1 IMPLANT
PACK ROBOT GYN CUSTOM WL (TRAY / TRAY PROCEDURE) ×1 IMPLANT
PAD ARMBOARD POSITIONER FOAM (MISCELLANEOUS) ×1 IMPLANT
PAD POSITIONING PINK XL (MISCELLANEOUS) ×1 IMPLANT
PORT ACCESS TROCAR AIRSEAL 12 (TROCAR) IMPLANT
SCISSORS LAP 5X45 EPIX DISP (ENDOMECHANICALS) IMPLANT
SCISSORS MNPLR CVD DVNC XI (INSTRUMENTS) ×1 IMPLANT
SCRUB CHG 4% DYNA-HEX 4OZ (MISCELLANEOUS) ×2 IMPLANT
SEAL UNIV 5-12 XI (MISCELLANEOUS) ×4 IMPLANT
SET TRI-LUMEN FLTR TB AIRSEAL (TUBING) ×1 IMPLANT
SPIKE FLUID TRANSFER (MISCELLANEOUS) ×1 IMPLANT
SPONGE T-LAP 18X18 ~~LOC~~+RFID (SPONGE) IMPLANT
SURGIFLO W/THROMBIN 8M KIT (HEMOSTASIS) IMPLANT
SUT MNCRL AB 4-0 PS2 18 (SUTURE) IMPLANT
SUT PDS AB 1 TP1 54 (SUTURE) IMPLANT
SUT VIC AB 0 CT1 27XBRD ANTBC (SUTURE) IMPLANT
SUT VIC AB 2-0 CT1 TAPERPNT 27 (SUTURE) IMPLANT
SUT VIC AB 4-0 PS2 18 (SUTURE) ×2 IMPLANT
SUT VICRYL 0 27 CT2 27 ABS (SUTURE) ×1 IMPLANT
SYR 10ML LL (SYRINGE) IMPLANT
SYSTEM BAG RETRIEVAL 10MM (BASKET) IMPLANT
SYSTEM WOUND ALEXIS 18CM MED (MISCELLANEOUS) IMPLANT
TRAP SPECIMEN MUCUS 40CC (MISCELLANEOUS) IMPLANT
TRAY FOLEY MTR SLVR 16FR STAT (SET/KITS/TRAYS/PACK) ×1 IMPLANT
TROCAR PORT AIRSEAL 5X120 (TROCAR) IMPLANT
TROCAR XCEL NON-BLD 5MMX100MML (ENDOMECHANICALS) ×1 IMPLANT
UNDERPAD 30X36 HEAVY ABSORB (UNDERPADS AND DIAPERS) ×2 IMPLANT
WATER STERILE IRR 1000ML POUR (IV SOLUTION) ×1 IMPLANT
YANKAUER SUCT BULB TIP 10FT TU (MISCELLANEOUS) IMPLANT

## 2024-01-07 NOTE — Anesthesia Procedure Notes (Signed)
 Procedure Name: Intubation Date/Time: 01/07/2024 4:27 PM  Performed by: Gladis Honey, CRNAPre-anesthesia Checklist: Patient identified, Emergency Drugs available, Suction available and Patient being monitored Patient Re-evaluated:Patient Re-evaluated prior to induction Oxygen Delivery Method: Circle System Utilized Preoxygenation: Pre-oxygenation with 100% oxygen Induction Type: IV induction Ventilation: Mask ventilation without difficulty Laryngoscope Size: Miller and 2 Grade View: Grade I Tube type: Oral Tube size: 7.0 mm Number of attempts: 1 Airway Equipment and Method: Stylet and Oral airway Placement Confirmation: ETT inserted through vocal cords under direct vision, positive ETCO2 and breath sounds checked- equal and bilateral Secured at: 21 cm Tube secured with: Tape Dental Injury: Teeth and Oropharynx as per pre-operative assessment

## 2024-01-07 NOTE — Transfer of Care (Signed)
 Immediate Anesthesia Transfer of Care Note  Patient: Shannon Gallegos  Procedure(s) Performed: ROBOTIC ASSISTED TOTAL LAPAROSCOPIC HYSTERECTOMY WITH BILATERAL SALPINGO-OOPHORECTOMY (Bilateral) BILATERAL SENTINEL LYMPH NODE INJECTION (Bilateral) BILATERAL SENTINEL LYMPH NODE BIOPSY (Bilateral)  Patient Location: PACU  Anesthesia Type:General  Level of Consciousness: alert , oriented, and drowsy  Airway & Oxygen Therapy: Patient Spontanous Breathing and Patient connected to nasal cannula oxygen  Post-op Assessment: Report given to RN and Post -op Vital signs reviewed and stable  Post vital signs: Reviewed and stable  Last Vitals:  Vitals Value Taken Time  BP 131/80 01/07/24 18:07  Temp    Pulse 82 01/07/24 18:11  Resp 13 01/07/24 18:11  SpO2 100 % 01/07/24 18:11  Vitals shown include unfiled device data.  Last Pain:  Vitals:   01/07/24 1432  TempSrc:   PainSc: 0-No pain         Complications: No notable events documented.

## 2024-01-07 NOTE — Anesthesia Preprocedure Evaluation (Signed)
 Anesthesia Evaluation  Patient identified by MRN, date of birth, ID band Patient awake    Reviewed: Allergy & Precautions, NPO status , Patient's Chart, lab work & pertinent test results  Airway Mallampati: II  TM Distance: >3 FB Neck ROM: Full    Dental no notable dental hx.    Pulmonary neg pulmonary ROS   Pulmonary exam normal        Cardiovascular hypertension, Pt. on medications  Rhythm:Regular Rate:Normal     Neuro/Psych negative neurological ROS  negative psych ROS   GI/Hepatic Neg liver ROS,GERD  Medicated,,  Endo/Other  negative endocrine ROS    Renal/GU negative Renal ROS  negative genitourinary   Musculoskeletal  (+) Arthritis , Osteoarthritis,    Abdominal Normal abdominal exam  (+)   Peds  Hematology Lab Results      Component                Value               Date                      WBC                      7.2                 01/01/2024                HGB                      13.6                01/01/2024                HCT                      42.6                01/01/2024                MCV                      93.0                01/01/2024                PLT                      228                 01/01/2024             Lab Results      Component                Value               Date                      NA                       141                 01/01/2024                K  3.8                 01/01/2024                CO2                      32                  01/01/2024                GLUCOSE                  98                  01/01/2024                BUN                      14                  01/01/2024                CREATININE               0.96                01/01/2024                CALCIUM                  10.4 (H)            01/01/2024                GFRNONAA                 >60                 01/01/2024              Anesthesia Other  Findings   Reproductive/Obstetrics                              Anesthesia Physical Anesthesia Plan  ASA: 2  Anesthesia Plan: General   Post-op Pain Management: Tylenol  PO (pre-op)*   Induction: Intravenous  PONV Risk Score and Plan: 3 and Ondansetron , Dexamethasone  and Treatment may vary due to age or medical condition  Airway Management Planned: Mask and Oral ETT  Additional Equipment: None  Intra-op Plan:   Post-operative Plan: Extubation in OR  Informed Consent: I have reviewed the patients History and Physical, chart, labs and discussed the procedure including the risks, benefits and alternatives for the proposed anesthesia with the patient or authorized representative who has indicated his/her understanding and acceptance.     Dental advisory given  Plan Discussed with: CRNA  Anesthesia Plan Comments:         Anesthesia Quick Evaluation

## 2024-01-07 NOTE — Interval H&P Note (Signed)
 History and Physical Interval Note:  01/07/2024 2:07 PM  Shannon Gallegos  has presented today for surgery, with the diagnosis of endometrial cancer.  The various methods of treatment have been discussed with the patient and family. After consideration of risks, benefits and other options for treatment, the patient has consented to  Procedure(s): HYSTERECTOMY, TOTAL, ROBOT-ASSISTED, LAPAROSCOPIC, WITH BILATERAL SALPINGO-OOPHORECTOMY (Bilateral) INJECTION, FOR SENTINEL LYMPH NODE IDENTIFICATION (Bilateral) LYMPH NODE BIOPSY (Bilateral) LYMPHADENECTOMY, PELVIS, ROBOT-ASSISTED (Bilateral) as a surgical intervention.  The patient's history has been reviewed, patient examined, no change in status, stable for surgery.  I have reviewed the patient's chart and labs.  Questions were answered to the patient's satisfaction.     [name]

## 2024-01-07 NOTE — Op Note (Signed)
 GYNECOLOGIC ONCOLOGY OPERATIVE NOTE  Date of Service: 01/07/2024  Preoperative Diagnosis: FIGO grade 1 endometrial cancer  Postoperative Diagnosis: Same  Procedures: Robotic-assisted total laparoscopic hysterectomy, bilateral salpingo-oophorectomy, bilateral sentinel lymph node evaluation and biopsy  Surgeon: Hoy Masters, MD  Assistants: Olam Mill, MD and (an MD assistant was necessary for tissue manipulation, management of robotic instrumentation, retraction and positioning due to the complexity of the case and hospital policies)  Anesthesia: General  Estimated Blood Loss: 25 ml  Urine Output: 200 ml, clear yellow  Findings: Normal upper abdominal survey with normal liver surface and diaphragm. Normal appearing small and large bowel. Uterus with right fundal fibroid, approximately 7-8cm. Otherwise normal appearing uterus. Evidence of prior tubal ligation. Normal ovaries. No evidence of peritoneal disease, ascites, or carcinomatosis. Adhesions of the sigmoid colon to the left adnexa and left pelvic sidewall. Aberrant vessels from bladder feeding to fibroid. Sentinel mapping on right to the obturator space; sentinel mapping on left to the obturator space.   Specimens:  ID Type Source Tests Collected by Time Destination  1 : Right obturator SLN Tissue PATH Sentinel Lymph Node SURGICAL PATHOLOGY Masters Hoy, MD 01/07/2024 1635   2 : Left obturator SLN Tissue PATH Sentinel Lymph Node SURGICAL PATHOLOGY Masters Hoy, MD 01/07/2024 1647   3 : Uterus, cervix, bilateral fallopien tubes and ovaries Tissue PATH Other SURGICAL PATHOLOGY Masters Hoy, MD 01/07/2024 1723     Complications:  None  Indications for Procedure: Shannon Gallegos is a 73 y.o. woman with biopsy showing endometrial cancer.  Prior to the procedure, all risks, benefits, and alternatives were discussed and informed surgical consent was signed.  Procedure: Patient was taken to the operating room  where general anesthesia was achieved.  She was positioned in dorsal lithotomy and prepped and draped.  A foley catheter was inserted into the bladder.1 ml of dilute Indo-Cyanine dye was was injected at 1cm and 1mm deep at 3 and 9 o'clock in the cervical stroma.  The cervix was dilated and a Zumi uterine manipulator with a colpotomy ring was inserted into the uterus.  A 12 mm incision was made in the left upper quadrant near Palmer's point.  The abdomen was entered with a 5 mm OptiView trocar under direct visualization.  The abdomen was insufflated, the patient placed in steep Trendelenburg, and additional trocars were placed as follows: an 8mm trocar superior to the umbilicus, two 8 mm robotic trocars in the right abdomen, and one 8 mm robotic trocar in the left abdomen.  The left upper quadrant trocar was removed and replaced with a 12 mm airseal trocar.  All trocars were placed under direct visualization.  The bowels were moved into the upper abdomen.  The DaVinci robotic surgical system was brought to the patient's bedside and docked.  The right round ligament was transected and the retroperitoneum entered.The right ureter was identified. The paravesical and pararectal spaces were opened, and the node was found to be located in the obturator space. A sentinel lymph node dissection was performed taking care to avoid injury to the ureter, superior vesicle artery or obturator nerve. A similar procedure was performed on left with the sentinel lymph node also found in the obturator space. Also, adhesions of the sigmoid colon to the left adnexa and left pelvic sidewall were lysed sharply to mobilize the colon off of the sidewall. The sentinel lymph nodes mentioned above were identified and removed through the assistant trocar.  The right ureter was again identified, and the right infundibulopelvic ligament  was isolated, cauterized, and transected. The posterior peritoneum was opened to the colpotomy ring. The  anterior peritoneum was opened and the bladder flap was initiated. Aberrant vessels to the fibroid were encountered during this dissection and they were isolated, cauterized, and transected. The right uterine artery was skeletonized, cauterized, and transected at the level of the colpotomy ring. Additional cautery was used in a C-shaped fashion to allow the remainder of the broad, cardinal, and uterosacral ligaments with the uterine vessels to be transected and fall away from the colpotomy ring.  A similar procedure was performed on the contralateral side. A colpotomy was made circumferentially following the contours of the colpotomy ring.  The uterine specimen was placed in an endocatch bag and removed through the vagina.  The vaginal cuff was closed with a running stitch of 0 Vicryl suture.  The pelvis was irrigated and all operative sites were found to be hemostatic.  All instruments were removed and the robot was taken from the patient's bedside. The fascia at the 12 mm incision was closed with 0 Vicryl using a PMI device. The abdomen was desufflated and all ports were removed. The skin at all incisions was closed with 4-0 Vicryl to reapproximate the subcutaneous tissue and 4-0 monocryl in a subcuticular fashion followed by surgical glue.  Patient tolerated the procedure well. Sponge, lap, and instrument counts were correct.  Patient received 2 gm of Ancef  and 500mg  of metronidazole prior to skin incision for routine perioperative antibiotic prophylaxis.  She was extubated and taken to the PACU in stable condition.  Hoy Masters, MD Gynecologic Oncology

## 2024-01-07 NOTE — Discharge Instructions (Addendum)
 AFTER SURGERY INSTRUCTIONS   Return to work: 4-6 weeks if applicable   Activity: 1. Be up and out of the bed during the day.  Take a nap if needed.  You may walk up steps but be careful and use the hand rail.  Stair climbing will tire you more than you think, you may need to stop part way and rest.    2. No lifting or straining for 6 weeks over 10 pounds. No pushing, pulling, straining for 6 weeks.   3. No driving for 2-95 days when the following criteria have been met: Do not drive if you are taking narcotic pain medicine and make sure that your reaction time has returned.    4. You can shower as soon as the next day after surgery. Shower daily.  Use your regular soap and water (not directly on the incision) and pat your incision(s) dry afterwards; don't rub.  No tub baths or submerging your body in water until cleared by your surgeon. If you have the soap that was given to you by pre-surgical testing that was used before surgery, you do not need to use it afterwards because this can irritate your incisions.    5. No sexual activity and nothing in the vagina for 12 weeks.   6. You may experience a small amount of clear drainage from your incisions, which is normal.  If the drainage persists, increases, or changes color please call the office.   7. Do not use creams, lotions, or ointments such as neosporin on your incisions after surgery until advised by your surgeon because they can cause removal of the dermabond glue on your incisions.     8. You may experience vaginal spotting after surgery or when the stitches at the top of the vagina begin to dissolve.  The spotting is normal but if you experience heavy bleeding, call our office.   9. Take Tylenol or ibuprofen first for pain if you are able to take these medications and only use narcotic pain medication for severe pain not relieved by the Tylenol or Ibuprofen.  Monitor your Tylenol intake to a max of 4,000 mg in a 24 hour period. You can  alternate these medications after surgery.   Diet: 1. Low sodium Heart Healthy Diet is recommended but you are cleared to resume your normal (before surgery) diet after your procedure.   2. It is safe to use a laxative, such as Miralax or Colace, if you have difficulty moving your bowels before surgery. You have been prescribed Sennakot-S to take at bedtime every evening after surgery to keep bowel movements regular and to prevent constipation.     Wound Care: 1. Keep clean and dry.  Shower daily.   Reasons to call the Doctor: Fever - Oral temperature greater than 100.4 degrees Fahrenheit Foul-smelling vaginal discharge Difficulty urinating Nausea and vomiting Increased pain at the site of the incision that is unrelieved with pain medicine. Difficulty breathing with or without chest pain New calf pain especially if only on one side Sudden, continuing increased vaginal bleeding with or without clots.   Contacts: For questions or concerns you should contact:   Dr. Clide Cliff at [phone]   Warner Mccreedy, NP at [phone]   After Hours: call [phone] and have the GYN Oncologist paged/contacted (after 5 pm or on the weekends). You will speak with an after hours RN and let he or she know you have had surgery.   Messages sent via mychart are for non-urgent  matters and are not responded to after hours so for urgent needs, please call the after hours number.

## 2024-01-08 ENCOUNTER — Encounter (HOSPITAL_COMMUNITY): Payer: Self-pay | Admitting: Psychiatry

## 2024-01-08 ENCOUNTER — Telehealth: Payer: Self-pay | Admitting: *Deleted

## 2024-01-08 NOTE — Telephone Encounter (Signed)
 Spoke with Shannon Gallegos this morning. She states she is eating, drinking and urinating well. She has not had a BM yet but is passing gas. She is taking senokot as prescribed and encouraged her to drink plenty of water . She denies fever or chills. Incisions are dry and intact. She rates her pain 3/10. Her pain is controlled with tylenol  today and patient states she took a hydrocodone  last night. Pt states she never picked up the oxycodone  at the pharmacy b/c she had the hydrocodone  already.     Instructed to call office with any fever, chills, purulent drainage, uncontrolled pain or any other questions or concerns. Patient verbalizes understanding.   Pt aware of post op appointments as well as the office number [phone] and after hours number [phone] to call if she has any questions or concerns

## 2024-01-08 NOTE — Anesthesia Postprocedure Evaluation (Signed)
 Anesthesia Post Note  Patient: Shannon Gallegos  Procedure(s) Performed: ROBOTIC ASSISTED TOTAL LAPAROSCOPIC HYSTERECTOMY WITH BILATERAL SALPINGO-OOPHORECTOMY (Bilateral) BILATERAL SENTINEL LYMPH NODE INJECTION (Bilateral) BILATERAL SENTINEL LYMPH NODE BIOPSY (Bilateral)     Patient location during evaluation: PACU Anesthesia Type: General Level of consciousness: awake and alert Pain management: pain level controlled Vital Signs Assessment: post-procedure vital signs reviewed and stable Respiratory status: spontaneous breathing, nonlabored ventilation, respiratory function stable and patient connected to nasal cannula oxygen Cardiovascular status: blood pressure returned to baseline and stable Postop Assessment: no apparent nausea or vomiting Anesthetic complications: no   No notable events documented.  Last Vitals:  Vitals:   01/07/24 1930 01/07/24 1945  BP: (!) 100/56 116/64  Pulse: 60 64  Resp:  17  Temp:  36.4 C  SpO2: 95% 92%    Last Pain:  Vitals:   01/07/24 1945  TempSrc:   PainSc: 3                  Ajai Terhaar P Woodroe Vogan

## 2024-01-10 LAB — SURGICAL PATHOLOGY

## 2024-01-13 ENCOUNTER — Ambulatory Visit: Payer: Self-pay | Admitting: Psychiatry

## 2024-01-16 ENCOUNTER — Inpatient Hospital Stay: Attending: Psychiatry | Admitting: Licensed Clinical Social Worker

## 2024-01-16 DIAGNOSIS — N898 Other specified noninflammatory disorders of vagina: Secondary | ICD-10-CM | POA: Insufficient documentation

## 2024-01-16 DIAGNOSIS — Z90710 Acquired absence of both cervix and uterus: Secondary | ICD-10-CM | POA: Insufficient documentation

## 2024-01-16 DIAGNOSIS — Z90722 Acquired absence of ovaries, bilateral: Secondary | ICD-10-CM | POA: Insufficient documentation

## 2024-01-16 DIAGNOSIS — C541 Malignant neoplasm of endometrium: Secondary | ICD-10-CM | POA: Insufficient documentation

## 2024-01-16 DIAGNOSIS — Z9079 Acquired absence of other genital organ(s): Secondary | ICD-10-CM | POA: Insufficient documentation

## 2024-01-16 NOTE — Progress Notes (Signed)
 [facility] CSW Progress Note  Clinical Child psychotherapist contacted patient by phone to follow-up on emotional support.    Interventions: Provided brief mental health counseling with regard to adjustment to cancer    Patient is doing well with coping post-surgery. She still has not told many people in her family as she wants all final results. CSW informed pt that Dr. Eldonna did send her a MyChart message about results.  Pt continues to remain positive and engaged. She shared more about her family and activities she enjoys.     Follow Up Plan:  CSW will follow-up with patient by phone in about 2 weeks for check on coping    Jonathen Rathman E Shawntee Mainwaring, LCSW Clinical Social Worker [facility]

## 2024-01-20 ENCOUNTER — Encounter: Payer: Self-pay | Admitting: Psychiatry

## 2024-01-20 ENCOUNTER — Inpatient Hospital Stay (HOSPITAL_BASED_OUTPATIENT_CLINIC_OR_DEPARTMENT_OTHER): Admitting: Psychiatry

## 2024-01-20 ENCOUNTER — Other Ambulatory Visit: Payer: Self-pay | Admitting: Oncology

## 2024-01-20 VITALS — BP 138/88 | HR 92 | Temp 98.2°F | Resp 19 | Wt 200.0 lb

## 2024-01-20 DIAGNOSIS — Z90710 Acquired absence of both cervix and uterus: Secondary | ICD-10-CM | POA: Diagnosis not present

## 2024-01-20 DIAGNOSIS — Z90722 Acquired absence of ovaries, bilateral: Secondary | ICD-10-CM

## 2024-01-20 DIAGNOSIS — C541 Malignant neoplasm of endometrium: Secondary | ICD-10-CM

## 2024-01-20 DIAGNOSIS — N898 Other specified noninflammatory disorders of vagina: Secondary | ICD-10-CM | POA: Diagnosis not present

## 2024-01-20 DIAGNOSIS — Z9079 Acquired absence of other genital organ(s): Secondary | ICD-10-CM | POA: Diagnosis not present

## 2024-01-20 DIAGNOSIS — Z7189 Other specified counseling: Secondary | ICD-10-CM

## 2024-01-20 NOTE — Patient Instructions (Signed)
 It was a pleasure to see you in clinic today. - No lifting >10lbs until 6 weeks postop - Nothing in the vagina until 10 weeks postop - Referral placed to radiation oncology to discussion vaginal brachytherapy.  - Return visit planned for after you complete radiation.   Thank you very much for allowing me to provide care for you today.  I appreciate your confidence in choosing our Gynecologic Oncology team at [facility].  If you have any questions about your visit today please call our office or send us  a MyChart message and we will get back to you as soon as possible.

## 2024-01-20 NOTE — Progress Notes (Signed)
 Gynecologic Oncology Return Clinic Visit  Date of Service: 01/20/2024 Referring Provider:  Jennifer Ozan, DO   Assessment & Plan: Shannon Gallegos is a 73 y.o. 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  Postop: - Pt recovering well from surgery and healing appropriately postoperatively - Ongoing postoperative expectations and precautions reviewed. Continue with no lifting >10lbs through 6 weeks postoperatively  Endometrial cancer: - Pathology results reviewed in detail - Given age >51 and one risk factor (deep myometrial invasion), recommend adjuvant treatment with VBT. - Treatment reviewed. - Referral to rad onc placed.  - Signs/symptoms of recurrence reviewed. - Follow-up after completion of adjuvant therapy.    RTC after completion of VBT.  Hoy Masters, MD Gynecologic Oncology   Medical Decision Making I personally spent  TOTAL 30 minutes face-to-face and non-face-to-face in the care of this patient, which includes all pre, intra, and post visit time on the date of service. The discussion of management of endometrial cancer is beyond the scope of routine postoperative care.   ----------------------- Reason for Visit: Postop/treatment discussion  Treatment History: Oncology History  Endometrial cancer (HCC)  12/02/2023 Initial Diagnosis   A. ENDOMETRIUM, BIOPSY:  Well-differentiated endometrioid adenocarcinoma, FIGO I  Tumor arises within a background of extensive complex atypical  hyperplasia (EIN) and acute inflammation   COMMENT:  Given the focality of the adenocarcinoma within the extensive background  EIN/complex atypical hyperplasia and the degree of acute inflammation,  immunohistochemical staining for p53 and MMR will be deferred to a  hysterectomy specimen.    01/07/2024 Surgery   Procedures: Robotic-assisted total laparoscopic hysterectomy, bilateral  salpingo-oophorectomy, bilateral sentinel lymph node evaluation and biopsy   Findings: Normal upper abdominal survey with normal liver surface and diaphragm. Normal appearing small and large bowel. Uterus with right fundal fibroid, approximately 7-8cm. Otherwise normal appearing uterus. Evidence of prior tubal ligation. Normal ovaries. No evidence of peritoneal disease, ascites, or carcinomatosis. Adhesions of the sigmoid colon to the left adnexa and left pelvic sidewall. Aberrant vessels from bladder feeding to fibroid. Sentinel mapping on right to the obturator space; sentinel mapping on left to the obturator space.    01/07/2024 Cancer Staging   Staging form: Corpus Uteri - Carcinoma and Carcinosarcoma, AJCC 8th Edition and FIGO 2023 - Pathologic stage from 01/07/2024: FIGO Stage IB (pT1b, pN0(sn), cM0, POLE: Not Assessed, MMRd-, p53-) - Signed by Masters Hoy, MD on 01/20/2024 Histopathologic type: Endometrioid adenocarcinoma, NOS Stage prefix: Initial diagnosis Method of lymph node assessment: Sentinel lymph node biopsy Histologic grade (G): G1 Histologic grading system: 3 grade system   01/07/2024 Pathology Results   A. RIGHT OBTURATOR SENTINEL LYMPH NODE, EXCISION: One benign lymph node, negative for carcinoma (0/1)  B. LEFT OBTURATOR SENTINEL LYMPH NODE, EXCISION: One benign lymph node, negative for carcinoma (0/1)  C. UTERUS WITH RIGHT AND LEFT FALLOPIAN TUBE AND OVARY, HYSTERECTOMY AND BILATERAL SALPINGO-OOPHORECTOMY: Invasive well-differentiated endometrioid adenocarcinoma Tumor invades greater than 50% of the myometrium (7 mm of 12 mm, 58%) (pT1b) Tumor measures 3.6 x 2.5 x 0.7 cm Extensive atypical hyperplasia (EIN) Negative for angiolymphatic invasion Multiple leiomyomata, intramural, largest measuring 6.6 cm in greatest dimension Chronic cervicitis with squamous metaplasia Benign fallopian tubes and ovaries  ONCOLOGY TABLE:  UTERUS, CARCINOMA OR CARCINOSARCOMA:  Resection  Procedure: Total hysterectomy and bilateral salpingo-oophorectomy with sentinel nodes Histologic Type: Endometrioid adenocarcinoma Histologic Grade: Well-differentiated (low-grade) Myometrial Invasion:      Depth of Myometrial  Invasion (mm): 7 mm      Myometrial Thickness (mm): 12 mm      Percentage of Myometrial Invasion: 58% Uterine Serosa Involvement: Not identified Cervical stromal Involvement: Not identified Extent of involvement of other tissue/organs: Not identified Peritoneal/Ascitic Fluid: Not applicable Lymphovascular Invasion: Not identified Regional Lymph Nodes:      Pelvic Lymph Nodes Examined:          2 Sentinel          0 non-sentinel          2 total      Pelvic Lymph Nodes with Metastasis: 0      Para-aortic Lymph Nodes Examined:   Not applicable  Distant Metastasis: Not applicable] Pathologic Stage Classification (pTNM, AJCC 8th Edition): pT1b, pN0 Ancillary Studies: MMR testing has been ordered; the results will be issued within an addendum. Representative Tumor Block: C8 Comment(s): A pancytokeratin immunohistochemical stains performed on each of the lymph nodes and is negative with adequate control.  An immunohistochemical stain for p53 performed with adequate control shows patchy positivity within the tumor consistent with a wild-type staining pattern. (v4.2.0.1)    IHC EXPRESSION RESULTS  TEST           RESULT MLH1:          Preserved nuclear expression MSH2:          Preserved nuclear expression MSH6:          Preserved nuclear expression PMS2:          Preserved nuclear expression      Interval History: Send today with her husband.  Pt reports that she is recovering well from surgery. She is not needing to use anything for pain. She is eating and drinking well. She is voiding without issue and having regular bowel movements. No bleeding, small vaginal discharge yesterday.    Past Medical/Surgical History: Past Medical History:   Diagnosis Date   Arthritis    Borderline diabetes    Hypercholesterolemia    Hypertension    Vertigo     Past Surgical History:  Procedure Laterality Date   BREAST BIOPSY Left 2000   neg   BREAST BIOPSY Right 01/19/2020   us  bx of LN, neg   CATARACT EXTRACTION Bilateral    COLONOSCOPY  05/2012   Dr. Angelena: sigmoid and descending colon diverticula, ADENOMATOUS polyp. Due for surveillance 2019   COLONOSCOPY WITH PROPOFOL  N/A 02/05/2022   Procedure: COLONOSCOPY WITH PROPOFOL ;  Surgeon: Onita Elspeth Sharper, DO;  Location: [facility] ENDOSCOPY;  Service: Gastroenterology;  Laterality: N/A;   ESOPHAGOGASTRODUODENOSCOPY (EGD) WITH ESOPHAGEAL DILATION N/A 12/09/2012   Dr. Harvey: small hiatal hernia, Schatzki's ring at GE junction s/p Savary dilation, benign sessile polyps, moderate gastritis   INJECTION, FOR SENTINEL LYMPH NODE IDENTIFICATION Bilateral 01/07/2024   Procedure: BILATERAL SENTINEL LYMPH NODE INJECTION;  Surgeon: [name], MD;  Location: [facility] ORS;  Service: Gynecology;  Laterality: Bilateral;   KNEE ARTHROSCOPY WITH MEDIAL MENISECTOMY Left 04/28/2015   Procedure: LEFT KNEE ARTHROSCOPY WITH PARTIAL MEDIAL MENISECTOMY;  Surgeon: Taft FORBES Minerva, MD;  Location: [facility] OR[facility];  Service: Orthopedics;  Laterality: Left;   KNEE SURGERY Bilateral    arthroscopies   LYMPH NODE BIOPSY Bilateral 01/07/2024   Procedure: BILATERAL SENTINEL LYMPH NODE BIOPSY;  Surgeon: [name], MD;  Location: [facility];  Service: Gynecology;  Laterality: Bilateral;   ROBOTIC ASSISTED TOTAL HYSTERECTOMY WITH BILATERAL SALPINGO OOPHERECTOMY Bilateral 01/07/2024   Procedure: ROBOTIC ASSISTED TOTAL LAPAROSCOPIC HYSTERECTOMY WITH BILATERAL SALPINGO-OOPHORECTOMY;  Surgeon: Eldonna,  Hoy, MD;  Location: [facility];  Service: Gynecology;  Laterality: Bilateral;   TUBAL LIGATION      Family History  Problem Relation Age of Onset   Cirrhosis Father        alcoholic   Lung cancer Father    Lung cancer  Sister    Cancer Brother        unknown type   Colon cancer Neg Hx    Breast cancer Neg Hx    Ovarian cancer Neg Hx    Endometrial cancer Neg Hx     Social History   Socioeconomic History   Marital status: Married    Spouse name: Not on file   Number of children: Not on file   Years of education: Not on file   Highest education level: Not on file  Occupational History   Occupation: retired  Tobacco Use   Smoking status: Never   Smokeless tobacco: Never  Vaping Use   Vaping status: Never Used  Substance and Sexual Activity   Alcohol use: No   Drug use: No   Sexual activity: Not Currently    Birth control/protection: Post-menopausal  Other Topics Concern   Not on file  Social History Narrative   Not on file   Social Drivers of Health   Financial Resource Strain: Low Risk  (12/02/2023)   Overall Financial Resource Strain (CARDIA)    Difficulty of Paying Living Expenses: Not very hard  Food Insecurity: No Food Insecurity (12/02/2023)   Hunger Vital Sign    Worried About Running Out of Food in the Last Year: Never true    Ran Out of Food in the Last Year: Never true  Transportation Needs: No Transportation Needs (12/02/2023)   PRAPARE - Administrator, Civil Service (Medical): No    Lack of Transportation (Non-Medical): No  Physical Activity: Inactive (12/02/2023)   Exercise Vital Sign    Days of Exercise per Week: 0 days    Minutes of Exercise per Session: 10 min  Stress: Stress Concern Present (12/02/2023)   [employer] of Occupational Health - Occupational Stress Questionnaire    Feeling of Stress: To some extent  Social Connections: Socially Integrated (12/02/2023)   Social Connection and Isolation Panel    Frequency of Communication with Friends and Family: More than three times a week    Frequency of Social Gatherings with Friends and Family: Three times a week    Attends Religious Services: More than 4 times per year    Active Member of Clubs or  Organizations: Yes    Attends Engineer, Structural: More than 4 times per year    Marital Status: Married    Current Medications:  Current Outpatient Medications:    atorvastatin (LIPITOR) 40 MG tablet, Take 40 mg by mouth in the morning., Disp: , Rfl:    Cholecalciferol (VITAMIN D3 PO), Take 2 tablets by mouth in the morning., Disp: , Rfl:    clobetasol ointment (TEMOVATE) 0.05 %, Apply 1 Application topically 2 (two) times daily as needed (skin irritation.)., Disp: , Rfl:    diclofenac  (VOLTAREN ) 75 MG EC tablet, Take 1 tablet (75 mg total) by mouth 2 (two) times daily. (Patient taking differently: Take 75 mg by mouth 2 (two) times daily as needed (pain.).), Disp: 30 tablet, Rfl: 0   DULoxetine (CYMBALTA) 30 MG capsule, Take 30 mg by mouth daily. (Patient taking differently: Take 30 mg by mouth daily as needed (pain.).), Disp: , Rfl:  furosemide (LASIX) 20 MG tablet, Take 20 mg by mouth in the morning., Disp: , Rfl: 0   hydrochlorothiazide (HYDRODIURIL) 25 MG tablet, Take 12.5 mg by mouth in the morning., Disp: , Rfl:    meclizine (ANTIVERT) 25 MG tablet, Take 25 mg by mouth 3 (three) times daily as needed for dizziness. , Disp: , Rfl:    omeprazole (PRILOSEC) 40 MG capsule, Take 40 mg by mouth in the morning., Disp: , Rfl: 1   Polyethyl Glycol-Propyl Glycol (LUBRICANT EYE DROPS) 0.4-0.3 % SOLN, Place 1-2 drops into both eyes 3 (three) times daily as needed (dry/irritated eyes.)., Disp: , Rfl:    potassium chloride  SA (K-DUR,KLOR-CON ) 20 MEQ tablet, Take 1 tablet (20 mEq total) by mouth daily., Disp: 30 tablet, Rfl: 5   senna-docusate (SENOKOT-S) 8.6-50 MG tablet, Take 2 tablets by mouth at bedtime. For AFTER surgery, do not take if having diarrhea, Disp: 30 tablet, Rfl: 0   oxyCODONE  (OXY IR/ROXICODONE ) 5 MG immediate release tablet, Take 1 tablet (5 mg total) by mouth every 4 (four) hours as needed for severe pain (pain score 7-10). For AFTER surgery only, do not take and drive  (Patient not taking: Reported on 01/15/2024), Disp: 10 tablet, Rfl: 0  Review of Symptoms: Complete 10-system review is positive for: Vaginal discharge yesterday  Physical Exam: BP 138/88 (BP Location: Left Arm, Patient Position: Sitting)   Pulse 92   Temp 98.2 F (36.8 C) (Oral)   Resp 19   Wt 200 lb (90.7 kg)   SpO2 99%   BMI 36.58 kg/m  General: Alert, oriented, no acute distress. HEENT: Normocephalic, atraumatic. Neck symmetric without masses. Sclera anicteric.  Chest: Normal work of breathing. Clear to auscultation bilaterally.   Cardiovascular: Regular rate and rhythm, no murmurs. Abdomen: Soft, nontender.  Normoactive bowel sounds.  No masses appreciated.  Well-healing incisions with glue. Extremities: Grossly normal range of motion.  Warm, well perfused.  No edema bilaterally. Skin: No rashes or lesions noted. GU: Normal appearing external genitalia without erythema, excoriation, or lesions.  Speculum exam reveals intact well-healing cuff.  Bimanual exam reveals intact vaginal cuff. Exam chaperoned by Kimberly Jordan, CMA   Laboratory & Radiologic Studies: Surgical pathology (01/07/24): A. RIGHT OBTURATOR SENTINEL LYMPH NODE, EXCISION: One benign lymph node, negative for carcinoma (0/1)  B. LEFT OBTURATOR SENTINEL LYMPH NODE, EXCISION: One benign lymph node, negative for carcinoma (0/1)  C. UTERUS WITH RIGHT AND LEFT FALLOPIAN TUBE AND OVARY, HYSTERECTOMY AND BILATERAL SALPINGO-OOPHORECTOMY: Invasive well-differentiated endometrioid adenocarcinoma Tumor invades greater than 50% of the myometrium (7 mm of 12 mm, 58%) (pT1b) Tumor measures 3.6 x 2.5 x 0.7 cm Extensive atypical hyperplasia (EIN) Negative for angiolymphatic invasion Multiple leiomyomata, intramural, largest measuring 6.6 cm in greatest dimension Chronic cervicitis with squamous metaplasia Benign fallopian tubes and ovaries  ONCOLOGY TABLE:  UTERUS, CARCINOMA OR CARCINOSARCOMA:  Resection  Procedure: Total hysterectomy and bilateral salpingo-oophorectomy with sentinel nodes Histologic Type: Endometrioid adenocarcinoma Histologic Grade: Well-differentiated (low-grade) Myometrial Invasion:      Depth of Myometrial Invasion (mm): 7 mm      Myometrial Thickness (mm): 12 mm      Percentage of Myometrial Invasion: 58% Uterine Serosa Involvement: Not identified Cervical stromal Involvement: Not identified Extent of involvement of other tissue/organs: Not identified Peritoneal/Ascitic Fluid: Not applicable Lymphovascular Invasion: Not identified Regional Lymph Nodes:      Pelvic Lymph Nodes Examined:          2 Sentinel          0  non-sentinel          2 total      Pelvic Lymph Nodes with Metastasis: 0      Para-aortic Lymph Nodes Examined:   Not applicable  Distant Metastasis: Not applicable] Pathologic Stage Classification (pTNM, AJCC 8th Edition): pT1b, pN0 Ancillary Studies: MMR testing has been ordered; the results will be issued within an addendum. Representative Tumor Block: C8 Comment(s): A pancytokeratin immunohistochemical stains performed on each of the lymph nodes and is negative with adequate control.  An immunohistochemical stain for p53 performed with adequate control shows patchy positivity within the tumor consistent with a wild-type staining pattern. (v4.2.0.1)   IHC EXPRESSION RESULTS  TEST           RESULT MLH1:          Preserved nuclear expression MSH2:          Preserved nuclear expression MSH6:          Preserved nuclear expression PMS2:          Preserved nuclear expression

## 2024-01-20 NOTE — Progress Notes (Signed)
 Gynecologic Oncology Multi-Disciplinary Disposition Conference Note  Date of the Conference: 01/20/2024  Patient Name: Shannon Gallegos  Referring Provider: Dr. Marilynn Primary GYN Oncologist: Dr. Eldonna   Stage/Disposition:  Stage IB, grade 1 endometrioid endometrial cancer. Disposition is to vaginal brachytherapy.   This Multidisciplinary conference took place involving physicians from Gynecologic Oncology, Medical Oncology, Radiation Oncology, Pathology, Radiology along with the Gynecologic Oncology Nurse Practitioner and Gynecologic Oncology Nurse Navigator.  Comprehensive assessment of the patient's malignancy, staging, need for surgery, chemotherapy, radiation therapy, and need for further testing were reviewed. Supportive measures, both inpatient and following discharge were also discussed. The recommended plan of care is documented. Greater than 35 minutes were spent correlating and coordinating this patient's care.

## 2024-01-28 NOTE — Progress Notes (Signed)
 GYN Location of Tumor / Histology: Endometrial  Nance VEAR Gavel presented with symptoms of: {symptoms; gyn cyclic:13153::bleeding}  Biopsies revealed:    Past/Anticipated interventions by Gyn/Onc surgery, if any:    Past/Anticipated interventions by medical oncology, if any: ***  Weight changes, if any: {:18581}  Bowel/Bladder complaints, if any: {yes no:[id number]}, {Blank single:19197::diarrhea,constipation,urinary frequency,burning,trouble emptying bladder, }  Nausea/Vomiting, if any: {:18581}  Pain issues, if any:  {:18581}  SAFETY ISSUES: Prior radiation? {:18581} Pacemaker/ICD? {:18581} Possible current pregnancy? {:18581} Is the patient on methotrexate? {:18581}  Current Complaints / other details:  ***

## 2024-01-28 NOTE — Progress Notes (Signed)
 Radiation Oncology         (336) [phone] ________________________________  Initial Outpatient Consultation  Name: Shannon Gallegos MRN: [id number]  Date: 01/29/2024  DOB: [birth year]  RR:Floopd, Mariano SQUIBB, DO  [name], MD   REFERRING PHYSICIAN: [name], MD  DIAGNOSIS: There were no encounter diagnoses.  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 female who is accompanied by ***. she is seen as a courtesy of Dr. Eldonna for an opinion concerning radiation therapy as part of management for her recently diagnosed endometrial cancer.   The patient presented to her OB/GYN in August with complains of post menopausal bleeding. To further investigate findings, she underwent an endometrial biopsy on 12/02/23 which returned with well-differentiated endometrioid adenocarcinoma, FIGO grade 1 arising within a background of extensive EIN.    She then underwent a pelvic ultrasound on 12/06/23 showing a uterus measuring 8.1 x 8.2 x 7.4 cm with a fibroid measuring 7.4 x 6.2 x 5.3 cm. Endometrium not well-visualized due to the large fibroid.   In light of findings, she was referred to Dr. Eldonna on 12/09/23 to discuss further treatment plan. Upon discussion, they opted to proceed with robotic assisted total laparoscopic hysterectomy, bilateral salpingo-oophorectomy, sentinel lymph node evaluation and biopsy, possible lymph node dissection which she underwent on 01/07/24. Surgical pathology indicating invasive well-differentiated endometrioid adenocarcinoma with tumor invading greater than 50% of the myometrium (58%). Multiple leiomyomata, intramural, largest measuring 6.6 cm in greatest dimension was also noted. Examined lymph nodes were negative for malignancy. Mismatch Repair Protein (IHC).  During her post-op follow up with Dr. Eldonna on 01/20/24, given patient age and the depth of myometrial invasion, she  recommends undergoing adjuvant treatment with VBT.        Case was presented to the tumor board on 01/20/24 with disposition for vaginal brachytherapy.   PREVIOUS RADIATION THERAPY: {EXAM; YES/NO:19492::No}  PAST MEDICAL HISTORY:  Past Medical History:  Diagnosis Date   Arthritis    Borderline diabetes    Hypercholesterolemia    Hypertension    Vertigo     PAST SURGICAL HISTORY: Past Surgical History:  Procedure Laterality Date   BREAST BIOPSY Left 2000   neg   BREAST BIOPSY Right 01/19/2020   us  bx of LN, neg   CATARACT EXTRACTION Bilateral    COLONOSCOPY  05/2012   Dr. Angelena: sigmoid and descending colon diverticula, ADENOMATOUS polyp. Due for surveillance 2019   COLONOSCOPY WITH PROPOFOL  N/A 02/05/2022   Procedure: COLONOSCOPY WITH PROPOFOL ;  Surgeon: Onita Elspeth Sharper, DO;  [facility]: [facility] ENDOSCOPY;  Service: Gastroenterology;  Laterality: N/A;   ESOPHAGOGASTRODUODENOSCOPY (EGD) WITH ESOPHAGEAL DILATION N/A 12/09/2012   Dr. Harvey: small hiatal hernia, Schatzki's ring at GE junction s/p Savary dilation, benign sessile polyps, moderate gastritis   INJECTION, FOR SENTINEL LYMPH NODE IDENTIFICATION Bilateral 01/07/2024   Procedure: BILATERAL SENTINEL LYMPH NODE INJECTION;  Surgeon: [name], MD;  Location: [facility] ORS;  Service: Gynecology;  Laterality: Bilateral;   KNEE ARTHROSCOPY WITH MEDIAL MENISECTOMY Left 04/28/2015   Procedure: LEFT KNEE ARTHROSCOPY WITH PARTIAL MEDIAL MENISECTOMY;  Surgeon: Taft FORBES Minerva, MD;  Location: [facility];  Service: Orthopedics;  Laterality: Left;   KNEE SURGERY Bilateral    arthroscopies   LYMPH NODE BIOPSY Bilateral 01/07/2024   Procedure: BILATERAL SENTINEL LYMPH NODE BIOPSY;  Surgeon: [name], MD;  Location: [facility];  Service: Gynecology;  Laterality: Bilateral;   ROBOTIC ASSISTED TOTAL HYSTERECTOMY  WITH BILATERAL SALPINGO OOPHERECTOMY Bilateral 01/07/2024   Procedure: ROBOTIC ASSISTED TOTAL LAPAROSCOPIC HYSTERECTOMY  WITH BILATERAL SALPINGO-OOPHORECTOMY;  Surgeon: [name], MD;  Location: [facility];  Service: Gynecology;  Laterality: Bilateral;   TUBAL LIGATION      FAMILY HISTORY:  Family History  Problem Relation Age of Onset   Cirrhosis Father        alcoholic   Lung cancer Father    Lung cancer Sister    Cancer Brother        unknown type   Colon cancer Neg Hx    Breast cancer Neg Hx    Ovarian cancer Neg Hx    Endometrial cancer Neg Hx     SOCIAL HISTORY:  Social History   Tobacco Use   Smoking status: Never   Smokeless tobacco: Never  Vaping Use   Vaping status: Never Used  Substance Use Topics   Alcohol use: No   Drug use: No    ALLERGIES:  Allergies  Allergen Reactions   Sulfamethoxazole-Trimethoprim Other (See Comments)    Mouth sores   Trazodone Other (See Comments)    drowsy    MEDICATIONS:  Current Outpatient Medications  Medication Sig Dispense Refill   atorvastatin (LIPITOR) 40 MG tablet Take 40 mg by mouth in the morning.     Cholecalciferol (VITAMIN D3 PO) Take 2 tablets by mouth in the morning.     clobetasol ointment (TEMOVATE) 0.05 % Apply 1 Application topically 2 (two) times daily as needed (skin irritation.).     diclofenac  (VOLTAREN ) 75 MG EC tablet Take 1 tablet (75 mg total) by mouth 2 (two) times daily. (Patient taking differently: Take 75 mg by mouth 2 (two) times daily as needed (pain.).) 30 tablet 0   DULoxetine (CYMBALTA) 30 MG capsule Take 30 mg by mouth daily. (Patient taking differently: Take 30 mg by mouth daily as needed (pain.).)     furosemide (LASIX) 20 MG tablet Take 20 mg by mouth in the morning.  0   hydrochlorothiazide (HYDRODIURIL) 25 MG tablet Take 12.5 mg by mouth in the morning.     meclizine (ANTIVERT) 25 MG tablet Take 25 mg by mouth 3 (three) times daily as needed for dizziness.      omeprazole (PRILOSEC) 40 MG capsule Take 40 mg by mouth in the morning.  1   oxyCODONE  (OXY IR/ROXICODONE ) 5 MG immediate release tablet Take  1 tablet (5 mg total) by mouth every 4 (four) hours as needed for severe pain (pain score 7-10). For AFTER surgery only, do not take and drive (Patient not taking: Reported on 01/15/2024) 10 tablet 0   Polyethyl Glycol-Propyl Glycol (LUBRICANT EYE DROPS) 0.4-0.3 % SOLN Place 1-2 drops into both eyes 3 (three) times daily as needed (dry/irritated eyes.).     potassium chloride  SA (K-DUR,KLOR-CON ) 20 MEQ tablet Take 1 tablet (20 mEq total) by mouth daily. 30 tablet 5   senna-docusate (SENOKOT-S) 8.6-50 MG tablet Take 2 tablets by mouth at bedtime. For AFTER surgery, do not take if having diarrhea 30 tablet 0   No current facility-administered medications for this visit.    REVIEW OF SYSTEMS:  A 10+ POINT REVIEW OF SYSTEMS WAS OBTAINED including neurology, dermatology, psychiatry, cardiac, respiratory, lymph, extremities, GI, GU, musculoskeletal, constitutional, reproductive, HEENT. ***   PHYSICAL EXAM:  vitals were not taken for this visit.   General: Alert and oriented, in no acute distress HEENT: Head is normocephalic. Extraocular movements are intact. Oropharynx is clear. Neck: Neck is supple, no palpable  cervical or supraclavicular lymphadenopathy. Heart: Regular in rate and rhythm with no murmurs, rubs, or gallops. Chest: Clear to auscultation bilaterally, with no rhonchi, wheezes, or rales. Abdomen: Soft, nontender, nondistended, with no rigidity or guarding. Extremities: No cyanosis or edema. Lymphatics: see Neck Exam Skin: No concerning lesions. Musculoskeletal: symmetric strength and muscle tone throughout. Neurologic: Cranial nerves II through XII are grossly intact. No obvious focalities. Speech is fluent. Coordination is intact. Psychiatric: Judgment and insight are intact. Affect is appropriate. ***  ECOG = ***  0 - Asymptomatic (Fully active, able to carry on all predisease activities without restriction)  1 - Symptomatic but completely ambulatory (Restricted in physically  strenuous activity but ambulatory and able to carry out work of a light or sedentary nature. For example, light housework, office work)  2 - Symptomatic, <50% in bed during the day (Ambulatory and capable of all self care but unable to carry out any work activities. Up and about more than 50% of waking hours)  3 - Symptomatic, >50% in bed, but not bedbound (Capable of only limited self-care, confined to bed or chair 50% or more of waking hours)  4 - Bedbound (Completely disabled. Cannot carry on any self-care. Totally confined to bed or chair)  5 - Death   Raylene MM, Creech RH, Tormey DC, et al. [phone]). Toxicity and response criteria of the [facility]. Am. DOROTHA Bridges. Oncol. 5 (6): 649-55  LABORATORY DATA:  Lab Results  Component Value Date   WBC 7.2 01/01/2024   HGB 13.6 01/01/2024   HCT 42.6 01/01/2024   MCV 93.0 01/01/2024   PLT 228 01/01/2024   NEUTROABS 3.6 04/26/2015   Lab Results  Component Value Date   NA 141 01/01/2024   K 3.8 01/01/2024   CL 99 01/01/2024   CO2 32 01/01/2024   GLUCOSE 98 01/01/2024   BUN 14 01/01/2024   CREATININE 0.96 01/01/2024   CALCIUM 10.4 (H) 01/01/2024      RADIOGRAPHY: No results found.    IMPRESSION: Stage IB, grade 1 endometrioid endometrial cancer (no LVSI, p53wt, MMRp)    ***  Today, I talked to the patient and family about the findings and work-up thus far.  We discussed the natural history of *** and general treatment, highlighting the role of radiotherapy in the management.  We discussed the available radiation techniques, and focused on the details of logistics and delivery.  We reviewed the anticipated acute and late sequelae associated with radiation in this setting.  The patient was encouraged to ask questions that I answered to the best of my ability. *** A patient consent form was discussed and signed.  We retained a copy for our records.  The patient would like to proceed with radiation and will be  scheduled for CT simulation.  PLAN: ***    *** minutes of total time was spent for this patient encounter, including preparation, face-to-face counseling with the patient and coordination of care, physical exam, and documentation of the encounter.   ------------------------------------------------  Lynwood CHARM Nasuti, PhD, MD  This document serves as a record of services personally performed by Lynwood Nasuti, MD. It was created on his behalf by Reymundo Cartwright, a trained medical scribe. The creation of this record is based on the scribe's personal observations and the provider's statements to them. This document has been checked and approved by the attending provider.

## 2024-01-29 ENCOUNTER — Ambulatory Visit
Admission: RE | Admit: 2024-01-29 | Discharge: 2024-01-29 | Disposition: A | Discharge status: Home / Self Care | Source: Ambulatory Visit | Attending: Radiation Oncology | Admitting: Radiation Oncology

## 2024-01-29 ENCOUNTER — Encounter: Payer: Self-pay | Admitting: Radiation Oncology

## 2024-01-29 VITALS — BP 122/73 | HR 99 | Temp 97.3°F | Resp 20 | Wt 198.2 lb

## 2024-01-29 DIAGNOSIS — E7800 Pure hypercholesterolemia, unspecified: Secondary | ICD-10-CM | POA: Diagnosis not present

## 2024-01-29 DIAGNOSIS — Z801 Family history of malignant neoplasm of trachea, bronchus and lung: Secondary | ICD-10-CM | POA: Insufficient documentation

## 2024-01-29 DIAGNOSIS — C541 Malignant neoplasm of endometrium: Secondary | ICD-10-CM

## 2024-01-29 DIAGNOSIS — Z791 Long term (current) use of non-steroidal anti-inflammatories (NSAID): Secondary | ICD-10-CM | POA: Diagnosis not present

## 2024-01-29 DIAGNOSIS — M129 Arthropathy, unspecified: Secondary | ICD-10-CM | POA: Insufficient documentation

## 2024-01-29 DIAGNOSIS — I10 Essential (primary) hypertension: Secondary | ICD-10-CM | POA: Diagnosis not present

## 2024-01-29 DIAGNOSIS — Z79899 Other long term (current) drug therapy: Secondary | ICD-10-CM | POA: Insufficient documentation

## 2024-01-30 ENCOUNTER — Inpatient Hospital Stay: Attending: Psychiatry | Admitting: Licensed Clinical Social Worker

## 2024-01-30 NOTE — Progress Notes (Signed)
 [facility] CSW Progress Note  Clinical Child Psychotherapist contacted patient by phone to follow-up on emotional support. Patient reports to be doing well and feeling optimistic about her physical outlook. She met with radiation oncology and is ready to complete that treatment.  Otherwise, she is focusing on living her life and engaging with things she enjoys, such as [employer] activities.   Call dropped unexpectedly.  CSW attempted to call back but there was no answer. Left a VM for patient with direct contact information.       Shannon Gallegos Shannon Zaveon Gillen, LCSW Clinical Social Worker [facility]

## 2024-02-06 ENCOUNTER — Other Ambulatory Visit (INDEPENDENT_AMBULATORY_CARE_PROVIDER_SITE_OTHER): Payer: Self-pay

## 2024-02-06 ENCOUNTER — Other Ambulatory Visit: Payer: Self-pay

## 2024-02-06 ENCOUNTER — Ambulatory Visit: Payer: Medicare HMO | Admitting: Orthopedic Surgery

## 2024-02-06 DIAGNOSIS — M170 Bilateral primary osteoarthritis of knee: Secondary | ICD-10-CM | POA: Diagnosis not present

## 2024-02-06 NOTE — Progress Notes (Signed)
   Chief Complaint  Patient presents with   Knee Pain    Both     73 year old female with bilateral knee pain osteoarthritis both knees left worse than right comes in today with symptomatic bilateral knee pain presumably from osteoarthritis  Patient requested injections but needed reevaluation  Knee Pain     PHYSICAL EXAM:   Focused examination on the knees  Both knees come to full extension and have full flexion without pain there is no tenderness swelling or laxity  DG Knee AP/LAT W/Sunrise Right Result Date: 02/06/2024 Bilateral knee x-rays right and left chronic knee pain OA X-rays show varus alignment to both knees left worse than right both knees show joint space narrowing and osteophyte formation consistent with grade 3 disease patellofemoral joint and lateral compartments also involved Impression osteoarthritis both knees with varus and grade 3 disease   DG Knee AP/LAT W/Sunrise Left Result Date: 02/06/2024 Bilateral knee x-rays right and left chronic knee pain OA X-rays show varus alignment to both knees left worse than right both knees show joint space narrowing and osteophyte formation consistent with grade 3 disease patellofemoral joint and lateral compartments also involved Impression osteoarthritis both knees with varus and grade 3 disease     Assessment and Plan:  Follow-up in a year everything looks good she is asymptomatic no injections today

## 2024-02-06 NOTE — Progress Notes (Signed)
    02/06/2024   Chief Complaint  Patient presents with   Knee Pain    Both     No diagnosis found.  What pharmacy do you use ? [facility]  DOI/DOS/ Date:    Did you get better, worse or no change (Answer below)   Unchanged Ready for injections today

## 2024-02-26 NOTE — Progress Notes (Incomplete)
 Shannon Gallegos is here today for follow up new vaginal cylinder fitting.    Does the patient complain of any of the following:  Pain:*** Abdominal bloating: *** Diarrhea/Constipation: *** Nausea/Vomiting: *** Vaginal Discharge: *** Blood in Urine or Stool: *** Urinary Issues (dysuria/incomplete emptying/ incontinence/ increased frequency/urgency): ***   Additional comments if applicable:

## 2024-03-01 NOTE — Progress Notes (Incomplete)
  Radiation Oncology         (336) [phone] ________________________________  Name: Shannon Gallegos MRN: [id number]  Date: 03/03/2024  DOB: March 18, 1951  CC: [name], DO  [name], MD  HDR BRACHYTHERAPY NOTE  DIAGNOSIS: The encounter diagnosis was Endometrial cancer [facility]).   Stage IB, grade 1 endometrioid endometrial cancer (no LVSI, p53wt, MMRp)     Simple treatment device note: Patient had construction of her custom vaginal cylinder. She will be treated with a *** cm diameter segmented cylinder. This conforms to her anatomy without undue discomfort.  Vaginal brachytherapy procedure node: The patient was brought to the HDR suite. Identity was confirmed. All relevant records and images related to the planned course of therapy were reviewed. The patient freely provided informed written consent to proceed with treatment after reviewing the details related to the planned course of therapy. The consent form was witnessed and verified by the simulation staff. Then, the patient was set-up in a stable reproducible supine position for radiation therapy. Pelvic exam revealed the vaginal cuff to be intact ***. The patient's custom vaginal cylinder was placed in the proximal vagina. This was affixed to the CT/MR stabilization plate to prevent slippage. Patient tolerated the placement well.  Verification simulation note:  A fiducial marker was placed within the vaginal cylinder. An AP and lateral film was then obtained through the pelvis area. This documented accurate position of the vaginal cylinder for treatment.  HDR BRACHYTHERAPY TREATMENT  The remote afterloading device was affixed to the vaginal cylinder by catheter. Patient then proceeded to undergo her first high-dose-rate treatment directed at the proximal vagina. The patient was prescribed a dose of *** gray to be delivered to the mucosal surface. Treatment length was *** cm. Patient was treated with *** channel using *** dwell  positions. Treatment time was *** seconds. Iridium 192 was the high-dose-rate source for treatment. The patient tolerated the treatment well. After completion of her therapy, a radiation survey was performed documenting return of the iridium source into the [employer] safe.   PLAN: patient will return next week to undergo her second high-dose-rate treatment  ________________________________  Lynwood CHARM Nasuti, PhD, MD  This document serves as a record of services personally performed by Lynwood Nasuti, MD. It was created on his behalf by Reymundo Cartwright, a trained medical scribe. The creation of this record is based on the scribe's personal observations and the provider's statements to them. This document has been checked and approved by the attending provider.

## 2024-03-01 NOTE — Progress Notes (Incomplete)
 Radiation Oncology         (336) [phone] ________________________________  Name: Shannon Gallegos MRN: [id number]  Date: 03/03/2024  DOB: 10-24-1950  Vaginal Brachytherapy Procedure Note  CC: [name], DO [name], MD  No diagnosis found.  Diagnosis:  The encounter diagnosis was Endometrial cancer ([facility]).   Stage IB, grade 1 endometrioid endometrial cancer (no LVSI, p53wt, MMRp)    Radiation Treatment Dates: patient will undergo her first treatment later today  Narrative: She returns today for vaginal cylinder fitting. She was last seen in office on 01/29/24 for a consultation visit.  In the interval since she was last seen, she presented for a follow up visit with Dr. Taft on 02/06/24 for management of her knees osteoarthritis.   No other significant oncologic interval history since the patient was last seen.       ALLERGIES: is allergic to sulfamethoxazole-trimethoprim and trazodone.  Meds: Current Outpatient Medications  Medication Sig Dispense Refill   atorvastatin (LIPITOR) 40 MG tablet Take 40 mg by mouth in the morning.     Cholecalciferol (VITAMIN D3 PO) Take 2 tablets by mouth in the morning.     clobetasol ointment (TEMOVATE) 0.05 % Apply 1 Application topically 2 (two) times daily as needed (skin irritation.).     diclofenac  (VOLTAREN ) 75 MG EC tablet Take 1 tablet (75 mg total) by mouth 2 (two) times daily. 30 tablet 0   DULoxetine (CYMBALTA) 30 MG capsule Take 30 mg by mouth daily.     furosemide (LASIX) 20 MG tablet Take 20 mg by mouth in the morning.  0   hydrochlorothiazide (HYDRODIURIL) 25 MG tablet Take 12.5 mg by mouth in the morning.     meclizine (ANTIVERT) 25 MG tablet Take 25 mg by mouth 3 (three) times daily as needed for dizziness.      omeprazole (PRILOSEC) 40 MG capsule Take 40 mg by mouth in the morning.  1   oxyCODONE  (OXY IR/ROXICODONE ) 5 MG immediate release tablet Take 1 tablet (5 mg total) by mouth every 4 (four) hours as  needed for severe pain (pain score 7-10). For AFTER surgery only, do not take and drive 10 tablet 0   Polyethyl Glycol-Propyl Glycol (LUBRICANT EYE DROPS) 0.4-0.3 % SOLN Place 1-2 drops into both eyes 3 (three) times daily as needed (dry/irritated eyes.).     potassium chloride  SA (K-DUR,KLOR-CON ) 20 MEQ tablet Take 1 tablet (20 mEq total) by mouth daily. 30 tablet 5   senna-docusate (SENOKOT-S) 8.6-50 MG tablet Take 2 tablets by mouth at bedtime. For AFTER surgery, do not take if having diarrhea 30 tablet 0   No current facility-administered medications for this visit.    Physical Findings: The patient is in no acute distress. Patient is alert and oriented.  vitals were not taken for this visit.   No palpable cervical, supraclavicular or axillary lymphoadenopathy. The heart has a regular rate and rhythm. The lungs are clear to auscultation. Abdomen soft and non-tender.  On pelvic examination the external genitalia were unremarkable. A speculum exam was performed. Vaginal cuff intact, no mucosal lesions. On bimanual exam there were no pelvic masses appreciated.  Lab Findings: Lab Results  Component Value Date   WBC 7.2 01/01/2024   HGB 13.6 01/01/2024   HCT 42.6 01/01/2024   MCV 93.0 01/01/2024   PLT 228 01/01/2024    Radiographic Findings: DG Knee AP/LAT W/Sunrise Right Result Date: 02/06/2024 Bilateral knee x-rays right and left chronic knee pain OA X-rays show varus alignment  to both knees left worse than right both knees show joint space narrowing and osteophyte formation consistent with grade 3 disease patellofemoral joint and lateral compartments also involved Impression osteoarthritis both knees with varus and grade 3 disease   DG Knee AP/LAT W/Sunrise Left Result Date: 02/06/2024 Bilateral knee x-rays right and left chronic knee pain OA X-rays show varus alignment to both knees left worse than right both knees show joint space narrowing and osteophyte formation consistent with  grade 3 disease patellofemoral joint and lateral compartments also involved 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 The patient will be treated with a *** cm diameter cylinder with a treatment length of *** cm. This distended the vaginal vault without undue discomfort. The patient tolerated the procedure well.  The patient was successfully fitted for a vaginal cylinder. The patient is appropriate to begin vaginal brachytherapy.   Plan: The patient will proceed with CT simulation and vaginal brachytherapy today.    _______________________________   Lynwood CHARM Nasuti, PhD, MD  This document serves as a record of services personally performed by Lynwood Nasuti, MD. It was created on his behalf by Reymundo Cartwright, a trained medical scribe. The creation of this record is based on the scribe's personal observations and the provider's statements to them. This document has been checked and approved by the attending provider.

## 2024-03-02 ENCOUNTER — Telehealth: Payer: Self-pay | Admitting: *Deleted

## 2024-03-02 NOTE — Telephone Encounter (Signed)
 CALLED PATIENT TO REMIND OF NEW [facility]DR [facility] APPTS. FOR 03-03-24, SPOKE WITH PATIENT AND SHE IS AWARE OF THESE APPTS.

## 2024-03-03 ENCOUNTER — Ambulatory Visit

## 2024-03-03 ENCOUNTER — Ambulatory Visit: Admitting: Radiation Oncology

## 2024-03-03 ENCOUNTER — Telehealth: Payer: Self-pay | Admitting: *Deleted

## 2024-03-03 NOTE — Telephone Encounter (Signed)
 CALLED PATIENT TO INFORM THAT NEW [facility]DR [facility] HAS BEEN RESCHEDULED FOR 03-11-24, SPOKE WITH PATIENT AND SHE IS AWARE OF THESE APPTS.

## 2024-03-10 ENCOUNTER — Telehealth: Payer: Self-pay | Admitting: *Deleted

## 2024-03-10 NOTE — Telephone Encounter (Signed)
 CALLED PATIENT TO REMIND OF NEW HDR [facility] FOR 03-11-24, SPOKE WITH PATIENT AND SHE IS AWARE OF THESE APPTS.

## 2024-03-11 ENCOUNTER — Ambulatory Visit
Admission: RE | Admit: 2024-03-11 | Discharge: 2024-03-11 | Attending: Radiation Oncology | Admitting: Radiation Oncology

## 2024-03-11 ENCOUNTER — Ambulatory Visit
Admission: RE | Admit: 2024-03-11 | Discharge: 2024-03-11 | Disposition: A | Discharge status: Home / Self Care | Source: Ambulatory Visit | Attending: Radiation Oncology | Admitting: Radiation Oncology

## 2024-03-11 ENCOUNTER — Encounter: Payer: Self-pay | Admitting: Radiation Oncology

## 2024-03-11 ENCOUNTER — Other Ambulatory Visit: Payer: Self-pay

## 2024-03-11 VITALS — BP 135/80 | HR 83 | Temp 97.1°F | Resp 18 | Ht 62.0 in | Wt 199.2 lb

## 2024-03-11 DIAGNOSIS — C541 Malignant neoplasm of endometrium: Secondary | ICD-10-CM | POA: Insufficient documentation

## 2024-03-11 DIAGNOSIS — Z79899 Other long term (current) drug therapy: Secondary | ICD-10-CM | POA: Diagnosis not present

## 2024-03-11 DIAGNOSIS — Z791 Long term (current) use of non-steroidal anti-inflammatories (NSAID): Secondary | ICD-10-CM | POA: Diagnosis not present

## 2024-03-11 LAB — RAD ONC ARIA SESSION SUMMARY
Course Elapsed Days: 0
Plan Fractions Treated to Date: 1
Plan Prescribed Dose Per Fraction: 6 Gy
Plan Total Fractions Prescribed: 5
Plan Total Prescribed Dose: 30 Gy
Reference Point Dosage Given to Date: 6 Gy
Reference Point Session Dosage Given: 6 Gy
Session Number: 1

## 2024-03-11 NOTE — Progress Notes (Signed)
 Shannon Gallegos is here today for follow up new vaginal cylinder fitting.     Does the patient complain of any of the following:  Pain: No Abdominal bloating: No Diarrhea/Constipation: No  Nausea/Vomiting: No Vaginal Discharge: Small light discharge.  Blood in Urine or Stool: No Urinary Issues (dysuria/incomplete emptying/ incontinence/ increased frequency/urgency): No      Additional comments/concerns:    BP 135/80 (BP Location: Left Arm, Patient Position: Sitting)   Pulse 83   Temp (!) 97.1 F (36.2 C) (Temporal)   Resp 18   Ht 5' 2 (1.575 m)   Wt 199 lb 4 oz (90.4 kg)   SpO2 99%   BMI 36.44 kg/m

## 2024-03-11 NOTE — Progress Notes (Signed)
 Radiation Oncology         [phone] ________________________________  Name: Shannon Gallegos MRN: [id number]  Date: 03/11/2024  DOB: 18-Dec-1950  Vaginal Brachytherapy Procedure Note  CC: Shannon Mariano SQUIBB, DO [name], MD    ICD-10-CM   1. 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  She has tolerated her first 2 cycles of chemotherapy reasonably well.  She denies any pelvic pain vaginal bleeding or discharge.  She denies any rectal bleeding or bowel complaints.  ALLERGIES: is allergic to sulfamethoxazole-trimethoprim and trazodone.  Meds: Current Outpatient Medications  Medication Sig Dispense Refill   atorvastatin (LIPITOR) 40 MG tablet Take 40 mg by mouth in the morning.     Cholecalciferol (VITAMIN D3 PO) Take 2 tablets by mouth in the morning.     clobetasol ointment (TEMOVATE) 0.05 % Apply 1 Application topically 2 (two) times daily as needed (skin irritation.).     diclofenac  (VOLTAREN ) 75 MG EC tablet Take 1 tablet (75 mg total) by mouth 2 (two) times daily. 30 tablet 0   DULoxetine (CYMBALTA) 30 MG capsule Take 30 mg by mouth daily.     furosemide (LASIX) 20 MG tablet Take 20 mg by mouth in the morning.  0   hydrochlorothiazide (HYDRODIURIL) 25 MG tablet Take 12.5 mg by mouth in the morning.     meclizine (ANTIVERT) 25 MG tablet Take 25 mg by mouth 3 (three) times daily as needed for dizziness.      omeprazole (PRILOSEC) 40 MG capsule Take 40 mg by mouth in the morning.  1   oxyCODONE  (OXY IR/ROXICODONE ) 5 MG immediate release tablet Take 1 tablet (5 mg total) by mouth every 4 (four) hours as needed for severe pain (pain score 7-10). For AFTER surgery only, do not take and drive 10 tablet 0   Polyethyl Glycol-Propyl Glycol (LUBRICANT EYE DROPS) 0.4-0.3 % SOLN Place 1-2 drops into both eyes 3 (three) times daily as  needed (dry/irritated eyes.).     potassium chloride  SA (K-DUR,KLOR-CON ) 20 MEQ tablet Take 1 tablet (20 mEq total) by mouth daily. 30 tablet 5   senna-docusate (SENOKOT-S) 8.6-50 MG tablet Take 2 tablets by mouth at bedtime. For AFTER surgery, do not take if having diarrhea 30 tablet 0   No current facility-administered medications for this encounter.    Physical Findings: The patient is in no acute distress. Patient is alert and oriented.  height is 5' 2 (1.575 m) and weight is 199 lb 4 oz (90.4 kg). Her temporal temperature is 97.1 F (36.2 C) (abnormal). Her blood pressure is 135/80 and her pulse is 83. Her respiration is 18 and oxygen saturation is 99%.   No palpable cervical, supraclavicular or axillary lymphoadenopathy. The heart has a regular rate and rhythm. The lungs are clear to auscultation. Abdomen soft and non-tender.  On pelvic examination the external genitalia were unremarkable. A speculum exam was performed. Vaginal cuff intact, no mucosal lesions. On bimanual exam there were no pelvic masses appreciated.  Lab Findings: Lab Results  Component Value Date   WBC 7.2 01/01/2024   HGB 13.6 01/01/2024   HCT 42.6 01/01/2024   MCV 93.0 01/01/2024   PLT 228 01/01/2024    Radiographic Findings: No results found.  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 The patient will be treated with a 3.0 cm diameter cylinder with a treatment length of 3.0 cm. This distended the vaginal vault without undue discomfort. The patient tolerated the procedure well.  The patient was successfully fitted for a vaginal cylinder. The patient is appropriate to begin vaginal brachytherapy.   Plan: The patient will proceed with CT simulation and vaginal brachytherapy today.    _______________________________   Lynwood CHARM Nasuti, PhD, MD

## 2024-03-11 NOTE — Progress Notes (Signed)
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 She will be treated with a 3.0 cm diameter segmented cylinder. This conforms to her anatomy without undue discomfort.  Vaginal brachytherapy procedure node: The patient was brought to the HDR suite. Identity was confirmed. All relevant records and images related to the planned course of therapy were reviewed. The patient freely provided informed written consent to proceed with treatment after reviewing the details related to the planned course of therapy. The consent form was witnessed and verified by the simulation staff. Then, the patient was set-up in a stable reproducible supine position for radiation therapy. Pelvic exam revealed the vaginal cuff to be intact . The patient's custom vaginal cylinder was placed in the proximal vagina. This was affixed to the CT/MR stabilization plate to prevent slippage. Patient tolerated the placement well.  Verification simulation note:  A fiducial marker was placed within the vaginal cylinder. An AP and lateral film was then obtained through the pelvis area. This documented accurate position of the vaginal cylinder for treatment.  HDR BRACHYTHERAPY TREATMENT  The remote afterloading device was affixed to the vaginal cylinder by catheter. Patient then proceeded to undergo her first high-dose-rate treatment directed at the proximal vagina. The patient was prescribed a dose of 6.0 gray to be delivered to the mucosal surface. Treatment length was 3.0 cm. Patient was treated with 1 channel using 7 dwell positions. Treatment time was 331.6 seconds. Iridium 192 was the  high-dose-rate source for treatment. The patient tolerated the treatment well. After completion of her therapy, a radiation survey was performed documenting return of the iridium source into the [employer] safe.   PLAN: She will return next week for a second high-dose-rate treatment. ________________________________  Lynwood CHARM Nasuti, PhD, MD

## 2024-03-16 ENCOUNTER — Telehealth: Payer: Self-pay | Admitting: *Deleted

## 2024-03-16 NOTE — Telephone Encounter (Signed)
 Called patient to remind of HDR TX. for 03-17-24 @  2 pm, spoke with patient and she is aware of this tx.

## 2024-03-16 NOTE — Progress Notes (Signed)
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 She will be treated with a 3.0 cm diameter segmented cylinder. This conforms to her anatomy without undue discomfort.  Vaginal brachytherapy procedure node: The patient was brought to the HDR suite. Identity was confirmed. All relevant records and images related to the planned course of therapy were reviewed. The patient freely provided informed written consent to proceed with treatment after reviewing the details related to the planned course of therapy. The consent form was witnessed and verified by the simulation staff. Then, the patient was set-up in a stable reproducible supine position for radiation therapy. Pelvic exam revealed the vaginal cuff to be intact . The patient's custom vaginal cylinder was placed in the proximal vagina. This was affixed to the CT/MR stabilization plate to prevent slippage. Patient tolerated the placement well.  Verification simulation note:  A fiducial marker was placed within the vaginal cylinder. An AP and lateral film was then obtained through the pelvis area. This documented accurate position of the vaginal cylinder for treatment.  HDR BRACHYTHERAPY TREATMENT  The remote afterloading device was affixed to the vaginal cylinder by catheter. Patient then proceeded to undergo her second high-dose-rate treatment directed at the proximal vagina. The patient was prescribed a dose of 6.0 gray to be delivered to the mucosal surface. Treatment length was 3.0 cm. Patient was treated with 1 channel using 7 dwell positions. Treatment time was 350.8 seconds. Iridium 192 was the  high-dose-rate source for treatment. The patient tolerated the treatment well. After completion of her therapy, a radiation survey was performed documenting return of the iridium source into the [employer] safe.   PLAN: She will return next week to undergo her third high-dose-rate treatment. ________________________________  Lynwood CHARM Nasuti, PhD, MD    This document serves as a record of services personally performed by Lynwood Nasuti, MD. It was created on his behalf by Reymundo Cartwright, a trained medical scribe. The creation of this record is based on the scribe's personal observations and the provider's statements to them. This document has been checked and approved by the attending provider.  "

## 2024-03-17 ENCOUNTER — Other Ambulatory Visit: Payer: Self-pay

## 2024-03-17 ENCOUNTER — Ambulatory Visit
Admission: RE | Admit: 2024-03-17 | Discharge: 2024-03-17 | Disposition: A | Discharge status: Home / Self Care | Source: Ambulatory Visit | Attending: Radiation Oncology | Admitting: Radiation Oncology

## 2024-03-17 DIAGNOSIS — C541 Malignant neoplasm of endometrium: Secondary | ICD-10-CM | POA: Diagnosis not present

## 2024-03-17 LAB — RAD ONC ARIA SESSION SUMMARY
Course Elapsed Days: 6
Plan Fractions Treated to Date: 2
Plan Prescribed Dose Per Fraction: 6 Gy
Plan Total Fractions Prescribed: 5
Plan Total Prescribed Dose: 30 Gy
Reference Point Dosage Given to Date: 12 Gy
Reference Point Session Dosage Given: 6 Gy
Session Number: 2

## 2024-03-18 ENCOUNTER — Telehealth: Payer: Self-pay | Admitting: *Deleted

## 2024-03-18 NOTE — Telephone Encounter (Signed)
 Called patient to remind of HDR TX. For 03-23-24 @ 2 pm, spoke with patient and she is aware of this tx.

## 2024-03-23 ENCOUNTER — Ambulatory Visit: Admitting: Radiation Oncology

## 2024-03-23 NOTE — Progress Notes (Signed)
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 She will be treated with a 3.0 cm diameter segmented cylinder. This conforms to her anatomy without undue discomfort.  Vaginal brachytherapy procedure node: The patient was brought to the HDR suite. Identity was confirmed. All relevant records and images related to the planned course of therapy were reviewed. The patient freely provided informed written consent to proceed with treatment after reviewing the details related to the planned course of therapy. The consent form was witnessed and verified by the simulation staff. Then, the patient was set-up in a stable reproducible supine position for radiation therapy. Pelvic exam revealed the vaginal cuff to be intact . The patient's custom vaginal cylinder was placed in the proximal vagina. This was affixed to the CT/MR stabilization plate to prevent slippage. Patient tolerated the placement well.  Verification simulation note:  A fiducial marker was placed within the vaginal cylinder. An AP and lateral film was then obtained through the pelvis area. This documented accurate position of the vaginal cylinder for treatment.  HDR BRACHYTHERAPY TREATMENT  The remote afterloading device was affixed to the vaginal cylinder by catheter. Patient then proceeded to undergo her third high-dose-rate treatment directed at the proximal vagina. The patient was prescribed a dose of 6.0 gray to be delivered to the mucosal surface. Treatment length was 3.0 cm. Patient was treated with 1 channel using 7 dwell positions. Treatment time was 378.1 seconds. Iridium 192 was the  high-dose-rate source for treatment. The patient tolerated the treatment well. After completion of her therapy, a radiation survey was performed documenting return of the iridium source into the [employer] safe.   PLAN: She will return next week to undergo her fourth high-dose-rate treatment. ________________________________  Lynwood CHARM Nasuti, PhD, MD    This document serves as a record of services personally performed by Lynwood Nasuti, MD. It was created on his behalf by Reymundo Cartwright, a trained medical scribe. The creation of this record is based on the scribe's personal observations and the provider's statements to them. This document has been checked and approved by the attending provider.  "

## 2024-03-24 ENCOUNTER — Telehealth: Payer: Self-pay | Admitting: *Deleted

## 2024-03-24 NOTE — Telephone Encounter (Signed)
 Called patient to remind of HDR Tx. for 03-25-24 @ 9 am, spoke with patient and she is aware of this tx.

## 2024-03-25 ENCOUNTER — Other Ambulatory Visit: Payer: Self-pay

## 2024-03-25 ENCOUNTER — Ambulatory Visit

## 2024-03-25 ENCOUNTER — Ambulatory Visit
Admission: RE | Admit: 2024-03-25 | Discharge: 2024-03-25 | Disposition: A | Discharge status: Home / Self Care | Source: Ambulatory Visit | Attending: Radiation Oncology | Admitting: Radiation Oncology

## 2024-03-25 ENCOUNTER — Ambulatory Visit: Admitting: Radiation Oncology

## 2024-03-25 DIAGNOSIS — C541 Malignant neoplasm of endometrium: Secondary | ICD-10-CM | POA: Diagnosis not present

## 2024-03-25 LAB — RAD ONC ARIA SESSION SUMMARY
Course Elapsed Days: 14
Plan Fractions Treated to Date: 3
Plan Prescribed Dose Per Fraction: 6 Gy
Plan Total Fractions Prescribed: 5
Plan Total Prescribed Dose: 30 Gy
Reference Point Dosage Given to Date: 18 Gy
Reference Point Session Dosage Given: 6 Gy
Session Number: 3

## 2024-03-30 ENCOUNTER — Ambulatory Visit: Admitting: Radiation Oncology

## 2024-03-31 ENCOUNTER — Telehealth: Payer: Self-pay | Admitting: *Deleted

## 2024-03-31 NOTE — Telephone Encounter (Signed)
 CALLED PATIENT TO REMIND OF HDR TX. FOR 04-01-24 @ 3 PM, SPOKE WITH PATIENT AND SHE IS AWARE OF THIS APPT.

## 2024-03-31 NOTE — Progress Notes (Signed)
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 She will be treated with a 3.0 cm diameter segmented cylinder. This conforms to her anatomy without undue discomfort.  Vaginal brachytherapy procedure node: The patient was brought to the HDR suite. Identity was confirmed. All relevant records and images related to the planned course of therapy were reviewed. The patient freely provided informed written consent to proceed with treatment after reviewing the details related to the planned course of therapy. The consent form was witnessed and verified by the simulation staff. Then, the patient was set-up in a stable reproducible supine position for radiation therapy. Pelvic exam revealed the vaginal cuff to be intact . The patient's custom vaginal cylinder was placed in the proximal vagina. This was affixed to the CT/MR stabilization plate to prevent slippage. Patient tolerated the placement well.  Verification simulation note:  A fiducial marker was placed within the vaginal cylinder. An AP and lateral film was then obtained through the pelvis area. This documented accurate position of the vaginal cylinder for treatment.  HDR BRACHYTHERAPY TREATMENT  The remote afterloading device was affixed to the vaginal cylinder by catheter. Patient then proceeded to undergo her fourth high-dose-rate treatment directed at the proximal vagina. The patient was prescribed a dose of 6.0 gray to be delivered to the mucosal surface. Treatment length was 3.0 cm. Patient was treated with 1 channel using 7 dwell positions. Treatment time was 403.8 seconds. Iridium 192 was the  high-dose-rate source for treatment. The patient tolerated the treatment well. After completion of her therapy, a radiation survey was performed documenting return of the iridium source into the [employer] safe.   PLAN: She will return next week to undergo her fifth high-dose-rate treatment. ________________________________  Lynwood CHARM Nasuti, PhD, MD    This document serves as a record of services personally performed by Lynwood Nasuti, MD. It was created on his behalf by Reymundo Cartwright, a trained medical scribe. The creation of this record is based on the scribe's personal observations and the provider's statements to them. This document has been checked and approved by the attending provider.  "

## 2024-04-01 ENCOUNTER — Ambulatory Visit
Admission: RE | Admit: 2024-04-01 | Discharge: 2024-04-01 | Disposition: A | Discharge status: Home / Self Care | Source: Ambulatory Visit | Attending: Radiation Oncology | Admitting: Radiation Oncology

## 2024-04-01 ENCOUNTER — Other Ambulatory Visit: Payer: Self-pay

## 2024-04-01 DIAGNOSIS — C541 Malignant neoplasm of endometrium: Secondary | ICD-10-CM | POA: Diagnosis present

## 2024-04-01 LAB — RAD ONC ARIA SESSION SUMMARY
Course Elapsed Days: 21
Plan Fractions Treated to Date: 4
Plan Prescribed Dose Per Fraction: 6 Gy
Plan Total Fractions Prescribed: 5
Plan Total Prescribed Dose: 30 Gy
Reference Point Dosage Given to Date: 24 Gy
Reference Point Session Dosage Given: 6 Gy
Session Number: 4

## 2024-04-07 ENCOUNTER — Telehealth: Payer: Self-pay | Admitting: *Deleted

## 2024-04-07 NOTE — Progress Notes (Signed)
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 She will be treated with a 3.0 cm diameter segmented cylinder. This conforms to her anatomy without undue discomfort.  Vaginal brachytherapy procedure node: The patient was brought to the HDR suite. Identity was confirmed. All relevant records and images related to the planned course of therapy were reviewed. The patient freely provided informed written consent to proceed with treatment after reviewing the details related to the planned course of therapy. The consent form was witnessed and verified by the simulation staff. Then, the patient was set-up in a stable reproducible supine position for radiation therapy. Pelvic exam revealed the vaginal cuff to be intact . The patient's custom vaginal cylinder was placed in the proximal vagina. This was affixed to the CT/MR stabilization plate to prevent slippage. Patient tolerated the placement well.  Verification simulation note:  A fiducial marker was placed within the vaginal cylinder. An AP and lateral film was then obtained through the pelvis area. This documented accurate position of the vaginal cylinder for treatment.  HDR BRACHYTHERAPY TREATMENT  The remote afterloading device was affixed to the vaginal cylinder by catheter. Patient then proceeded to undergo her fifth high-dose-rate treatment directed at the proximal vagina. The patient was prescribed a dose of 6.0 gray to be delivered to the mucosal surface. Treatment length was 3.0 cm. Patient was treated with 1 channel using 7 dwell positions. Treatment time was 170.3 seconds. Iridium 192 was the  high-dose-rate source for treatment. The patient tolerated the treatment well. After completion of her therapy, a radiation survey was performed documenting return of the iridium source into the [employer] safe.   PLAN: patient completed her fifth and last high-dose-rate treatment. She will return next month for a follow up visit.  Overall she tolerated this treatment very well without any significant side effects. _______________________________  Lynwood CHARM Nasuti, PhD, MD    This document serves as a record of services personally performed by Lynwood Nasuti, MD. It was created on his behalf by Reymundo Cartwright, a trained medical scribe. The creation of this record is based on the scribe's personal observations and the provider's statements to them. This document has been checked and approved by the attending provider.  "

## 2024-04-07 NOTE — Telephone Encounter (Signed)
 CALLED PATIENT TO REMIND OF HDR TX. FOR 04/08/24 @ 2 PM, LVM FOR A RETURN CALL

## 2024-04-08 ENCOUNTER — Other Ambulatory Visit: Payer: Self-pay

## 2024-04-08 ENCOUNTER — Ambulatory Visit
Admission: RE | Admit: 2024-04-08 | Discharge: 2024-04-08 | Disposition: A | Discharge status: Home / Self Care | Source: Ambulatory Visit | Attending: Radiation Oncology | Admitting: Radiation Oncology

## 2024-04-08 DIAGNOSIS — C541 Malignant neoplasm of endometrium: Secondary | ICD-10-CM

## 2024-04-08 LAB — RAD ONC ARIA SESSION SUMMARY
Course Elapsed Days: 28
Plan Fractions Treated to Date: 5
Plan Prescribed Dose Per Fraction: 6 Gy
Plan Total Fractions Prescribed: 5
Plan Total Prescribed Dose: 30 Gy
Reference Point Dosage Given to Date: 30 Gy
Reference Point Session Dosage Given: 6 Gy
Session Number: 5

## 2024-04-10 NOTE — Radiation Completion Notes (Addendum)
" °  Radiation Oncology         [phone] ________________________________  Name: Shannon Gallegos MRN: [id number]  Date of Service: 04/08/2024  DOB: 1951-01-12  End of Treatment Note   Diagnosis: Stage IB, grade 1 endometrioid endometrial cancer  Intent: Curative     ==========DELIVERED PLANS==========  First Treatment Date: 2024-03-11 Last Treatment Date: 2024-04-08   Plan Name: [id number] Site: Vagina Technique: HDR Ir-192 Mode: Brachytherapy Dose Per Fraction: 6 Gy Prescribed Dose (Delivered / Prescribed): 30 Gy / 30 Gy Prescribed Fxs (Delivered / Prescribed): 5 / 5     ====================================   The patient tolerated radiation very well without any significant side effects.   The patient will return in one month and will continue follow up with Dr. Eldonna as well.      Ronita Due, PA-C "

## 2024-04-16 ENCOUNTER — Other Ambulatory Visit: Payer: Self-pay | Admitting: Orthopedic Surgery

## 2024-04-16 ENCOUNTER — Telehealth: Payer: Self-pay | Admitting: Orthopedic Surgery

## 2024-04-16 DIAGNOSIS — M1711 Unilateral primary osteoarthritis, right knee: Secondary | ICD-10-CM

## 2024-04-16 DIAGNOSIS — G8929 Other chronic pain: Secondary | ICD-10-CM

## 2024-04-16 NOTE — Telephone Encounter (Signed)
 Dr. Areatha pt - spoke w/the pt, she is requesting Hydrocodone  to be sent to [facility].  She stated that she was here in November, but nothing was sent in.  [phone]

## 2024-04-28 NOTE — Addendum Note (Signed)
 Encounter addended by: Wyatt Leeroy HERO, PA-C on: 04/28/2024 12:19 PM  Actions taken: Clinical Note Signed

## 2024-05-05 ENCOUNTER — Ambulatory Visit: Admitting: Radiology

## 2024-05-19 ENCOUNTER — Ambulatory Visit: Admitting: Radiology

## 2025-02-08 ENCOUNTER — Ambulatory Visit: Admitting: Orthopedic Surgery
